# Patient Record
Sex: Female | Born: 1975 | Race: Black or African American | Hispanic: No | Marital: Single | Smoking: Former smoker
Health system: Southern US, Community
[De-identification: ages and names within clinical notes are randomized; demographics above are authoritative.]

## PROBLEM LIST (undated history)

## (undated) DIAGNOSIS — M5126 Other intervertebral disc displacement, lumbar region: Secondary | ICD-10-CM

## (undated) DIAGNOSIS — G35 Multiple sclerosis: Secondary | ICD-10-CM

## (undated) DIAGNOSIS — M5136 Other intervertebral disc degeneration, lumbar region: Secondary | ICD-10-CM

## (undated) DIAGNOSIS — E079 Disorder of thyroid, unspecified: Secondary | ICD-10-CM

## (undated) DIAGNOSIS — J45909 Unspecified asthma, uncomplicated: Secondary | ICD-10-CM

## (undated) DIAGNOSIS — M6520 Calcific tendinitis, unspecified site: Secondary | ICD-10-CM

## (undated) DIAGNOSIS — J189 Pneumonia, unspecified organism: Secondary | ICD-10-CM

## (undated) DIAGNOSIS — M1990 Unspecified osteoarthritis, unspecified site: Secondary | ICD-10-CM

## (undated) HISTORY — PX: CHOLECYSTECTOMY: SHX55

## (undated) HISTORY — DX: Other intervertebral disc displacement, lumbar region: M51.26

---

## 2002-08-17 ENCOUNTER — Encounter: Payer: Self-pay | Admitting: Emergency Medicine

## 2002-08-17 ENCOUNTER — Encounter (INDEPENDENT_AMBULATORY_CARE_PROVIDER_SITE_OTHER): Payer: Self-pay | Admitting: *Deleted

## 2002-08-17 ENCOUNTER — Observation Stay (HOSPITAL_COMMUNITY): Admission: EM | Admit: 2002-08-17 | Discharge: 2002-08-18 | Payer: Self-pay | Admitting: Emergency Medicine

## 2002-08-17 ENCOUNTER — Encounter: Payer: Self-pay | Admitting: General Surgery

## 2004-09-08 ENCOUNTER — Emergency Department (HOSPITAL_COMMUNITY): Admission: EM | Admit: 2004-09-08 | Discharge: 2004-09-08 | Payer: Self-pay | Admitting: Family Medicine

## 2004-12-20 ENCOUNTER — Ambulatory Visit: Payer: Self-pay | Admitting: Internal Medicine

## 2005-01-10 ENCOUNTER — Ambulatory Visit: Payer: Self-pay | Admitting: Internal Medicine

## 2005-06-27 ENCOUNTER — Other Ambulatory Visit: Admission: RE | Admit: 2005-06-27 | Discharge: 2005-06-27 | Payer: Self-pay | Admitting: Gynecology

## 2005-08-26 ENCOUNTER — Emergency Department (HOSPITAL_COMMUNITY): Admission: EM | Admit: 2005-08-26 | Discharge: 2005-08-26 | Payer: Self-pay | Admitting: Emergency Medicine

## 2006-01-15 ENCOUNTER — Ambulatory Visit: Payer: Self-pay | Admitting: Obstetrics & Gynecology

## 2006-01-18 ENCOUNTER — Ambulatory Visit: Payer: Self-pay | Admitting: Family Medicine

## 2006-01-19 ENCOUNTER — Encounter (INDEPENDENT_AMBULATORY_CARE_PROVIDER_SITE_OTHER): Payer: Self-pay | Admitting: *Deleted

## 2006-01-19 ENCOUNTER — Inpatient Hospital Stay (HOSPITAL_COMMUNITY): Admission: AD | Admit: 2006-01-19 | Discharge: 2006-01-23 | Payer: Self-pay | Admitting: Gynecology

## 2006-04-11 ENCOUNTER — Other Ambulatory Visit: Admission: RE | Admit: 2006-04-11 | Discharge: 2006-04-11 | Payer: Self-pay | Admitting: Gynecology

## 2006-08-16 ENCOUNTER — Ambulatory Visit (HOSPITAL_BASED_OUTPATIENT_CLINIC_OR_DEPARTMENT_OTHER): Admission: RE | Admit: 2006-08-16 | Discharge: 2006-08-16 | Payer: Self-pay | Admitting: Gynecology

## 2007-08-20 ENCOUNTER — Emergency Department (HOSPITAL_COMMUNITY): Admission: EM | Admit: 2007-08-20 | Discharge: 2007-08-20 | Payer: Self-pay | Admitting: Emergency Medicine
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
## 2009-03-27 DIAGNOSIS — A419 Sepsis, unspecified organism: Secondary | ICD-10-CM

## 2009-03-27 HISTORY — DX: Sepsis, unspecified organism: A41.9

## 2010-08-12 NOTE — H&P (Signed)
NAME:  [name], [name]                     ACCOUNT NO.:  [id number]   MEDICAL RECORD NO.:  [id number]                   PATIENT TYPE:  INP   LOCATION:  1826                                 FACILITY:  [facility]   PHYSICIAN:  Sharlet Salina T. Hoxworth, M.D.          DATE OF BIRTH:  [birth year]   DATE OF ADMISSION:  08/17/2002  DATE OF DISCHARGE:                                HISTORY & PHYSICAL   CHIEF COMPLAINT:  Abdominal pain.   HISTORY OF PRESENT ILLNESS:  The patient is a 35 year old black female who  presented to the [facility] Emergency Room with a 12-hour history of  persistent, severe, pressure-like pain in the epigastrium, radiating up into  the chest to the right upper quadrant and to her back.  This has been  associated with nausea and vomiting.  She has had episodes of less severe  similar pain over the last few weeks.  No change in bowel habits.  No fever,  chills, or jaundice.   PAST MEDICAL HISTORY:  She has had a C section, otherwise entirely negative.   MEDICATIONS:  None.   ALLERGIES:  PENICILLIN.   SOCIAL HISTORY:  She is separated.  Does not smoke cigarettes or drink  alcohol.   FAMILY HISTORY:  Noncontributory.   REVIEW OF SYSTEMS:  GENERAL:  No fever, chills, weight change.  RESPIRATORY:  No shortness of breath, cough, history of asthma.  CARDIAC:  No  palpitations, history of heart disease, or chest pain other than as above.  ABDOMEN:  As above.  GU: No burning, frequency, abnormal bleeding, or  discharge.   PHYSICAL EXAMINATION:  GENERAL:  Mildly obese black female in no acute  distress.  SKIN:  Warm and dry without rash or infection.  NECK:  No palpable mass or thyromegaly.  HEENT:  Sclerae nonicteric.  Nares and oropharynx clear.  LUNGS:  Clear to auscultation.  CARDIAC:  Regular rate and rhythm without murmurs.  No JVD or edema.  ABDOMEN:  Tender in the epigastrium and right upper quadrant.  No palpable  masses or hepatosplenomegaly.  EXTREMITIES:   No edema or deformity.  NEUROLOGIC:  Alert and fully oriented.   LABORATORY DATA:  Lipase, CBC, electrolytes are all within normal limits.  SGOT and SGPT are elevated at 265 and 94, respectively.  Alkaline  phosphatase and bilirubin are normal.   Ultrasound of the gallbladder has shown a large stone impacting the  gallbladder neck.  Common bile duct and hepatic ducts appear normal.    ASSESSMENT AND PLAN:  Apparent cholelithiasis with acute cholecystitis.  I  have recommended proceeding with laparoscopic cholecystectomy with  cholangiogram.  She is brought into the hospital for this procedure.  Lorne Skeens. Hoxworth, M.D.    Tory Emerald  D:  08/17/2002  T:  08/17/2002  Job:  [id number]

## 2010-08-12 NOTE — H&P (Signed)
[name], [name]               ACCOUNT NO.:  [id number]   MEDICAL RECORD NO.:  [id number]          PATIENT TYPE:  INP   LOCATION:  9109                          FACILITY:  [facility]   PHYSICIAN:  Juan H. [name], M.D.DATE OF BIRTH:  [birth year]   DATE OF ADMISSION:  01/19/2006  DATE OF DISCHARGE:                                HISTORY & PHYSICAL   CHIEF COMPLAINT:  1. Twin gestation 63 1/2 weeks estimated gestational age.  2. Contractions and pelvic pain.  3. Decreased AFI in twin B.  4. Disproportionate growth between A and B.   HISTORY:  The patient is a 35 year old gravida 2, para 1, last menstrual  period May 16, 2005, estimated date of confinement February 20, 2006,  currently 35 1/[redacted] weeks gestation.  The patient presented to the office today  complaining of increasing low pelvic pressure.  The patient could barely  walk when she presented to the office today.  Her pelvic exam demonstrated  her cervix was fingertip, 80-90% effaced, ballotable presentation.  She had  been placed on the monitor and was found to have irregular contractions and  could not find a comfortable position because of the constant pelvic pain.  No vaginal bleeding was reported.  The patient has been followed prenatally  due to her twin gestation with serial ultrasounds and NSTs.  She does have  amniotic/dichorionic twins.  The last ultrasound done in the office on  October 25 demonstrated that twin B was transverse lie, approximately the  50th percentile for weight, gestational age and AFI was marginal at 4.1.  Twin A was large for gestational age measuring 90th percentile with an AFI  of 5.9.  These twins were spontaneous, they were not ovulation induction.  Patient with a prior cesarean section had requested elective repeat which  was originally scheduled for November 8.  The remainder of her prenatal  course also significant for the fact that she declined first trimester  screening and cystic  fibrosis screening.   ALLERGIES:  The patient is allergic to PENICILLIN.   PAST MEDICAL HISTORY:  She has had one cesarean section in December 1998  secondary to failure to progress at [redacted] weeks gestation.  She denies any  other medical problems.  For additional information, see form.   REVIEW OF SYSTEMS:  See form.   PHYSICAL EXAMINATION:  VITAL SIGNS:  The patient's blood pressure initially was 140/92, repeat  132/88 and 130/88, no protein or sugar in her urine.  HEENT:  Unremarkable.  NECK:  Supple, trachea midline, no carotid bruits, no thyromegaly.  LUNGS:  Clear to auscultation without rhonchi or wheezes.  HEART:  Regular rate and rhythm.  No murmurs or gallops.  BREASTS:  Exam not done.  ABDOMEN:  Gravid uterus.  Fundal height 40 cm.  Fetal heart tones x2.  PELVIC:  Cervix fingertip, 90% effaced, tender on pelvic exam, no vaginal  discharge noted.  EXTREMITIES:  DTRs 1+, negative clonus, trace edema.   ASSESSMENT:  35 year old gravida 2, para 1, at 37 1/2 weeks estimated  gestational age with twin gestation (amniotic dichorionic) with premature  contractions and pelvic pain, significant cervical effacement.  Recent  ultrasound difference in growth, twin A in the 90th percentile, twin B in  the 50th percentile.  Also, decreased AFI on twin B with an AFI of 4.1 and  AFI of twin A was 5.9.  The patient was counseled that, at this point, to  prevent the risks of her rupturing her uterus and since she is having a lot  of lower abdominal pressure and pain, that I will proceed with a repeat  emergency cesarean section now and she is requesting permanent  sterilization, a segment of each tube will be transected and sent off for  pathology.  She is fully aware that this procedure is permanent, she will  not be able to have anymore children.  The risks of infection, bleeding,  trauma to internal organs were discussed with the patient.  Her husband was  in attendance.  All questions  were answered.   PLAN:  Repeat lower uterine segment transverse cesarean section with  bilateral tubal sterilization procedure immediately.      Juan H. [name], M.D.  Electronically Signed     JHF/MEDQ  D:  01/19/2006  T:  01/19/2006  Job:  [id number]

## 2010-08-12 NOTE — Discharge Summary (Signed)
[name], [name]               ACCOUNT NO.:  [id number]   MEDICAL RECORD NO.:  [id number]          PATIENT TYPE:  INP   LOCATION:  9109                          FACILITY:  [facility]   PHYSICIAN:  Timothy P. Fontaine, M.D.DATE OF BIRTH:  February 19, 1976   DATE OF ADMISSION:  01/19/2006  DATE OF DISCHARGE:  01/23/2006                                 DISCHARGE SUMMARY   DISCHARGE DIAGNOSES:  1. Twin pregnancy at 35 weeks, delivered.  2. Labor.  3. History of prior cesarean section for repeat cesarean section.  4. Desires permanent sterilization.   PROCEDURE:  Repeat low transverse cervical cesarean section with bilateral  tubal sterilization per Dr. Gaetano Hawthorne. [name] on 01/19/2006, pathology  pending.   HOSPITAL COURSE:  A 35 year old G2, P33 female at [redacted] weeks gestation with  twins who presented in labor.  The patient has a history of prior cesarean  section and was planned for a repeat cesarean section.  She subsequently  underwent a repeat low transverse cervical cesarean section, tubal  sterilization without difficulty; producing twin A 5 pounds 12 ounces,  Apgars 9 and 9, female; twin B 4 pounds 12 ounces, Apgars 8 and 9, female.   The patient's postpartum course was uncomplicated.  She was discharged on  postpartum day #4 ambulating well, tolerating a regular diet with  postoperative hemoglobin of 10.8.  Her rubella titer is positive.  Her blood  type is B+.  She received precautions, instructions, and follow up.  She  will be seen in the office 6 weeks following discharge.  She received a  prescription for pain medication per Dr. Reynaldo Minium.      Timothy P. Fontaine, M.D.  Electronically Signed     TPF/MEDQ  D:  01/23/2006  T:  01/23/2006  Job:  [id number]

## 2010-08-12 NOTE — Op Note (Signed)
[name], [name]               ACCOUNT NO.:  [id number]   MEDICAL RECORD NO.:  [id number]          PATIENT TYPE:  INP   LOCATION:                                FACILITY[facility]   PHYSICIAN:  Juan H. [name], M.D.DATE OF BIRTH:  December 29, 1975   DATE OF PROCEDURE:  01/19/2006  DATE OF DISCHARGE:                                 OPERATIVE REPORT   INDICATIONS FOR OPERATION:  35 year old gravida 2, para 1 with twin  gestation at 35-1/2 weeks estimated gestational age, in labor with previous  cesarean section, currently 35-1/[redacted] weeks gestation, also requesting elective  permanent sterilization.   PREOPERATIVE DIAGNOSES:  1. Twin gestation.  2. Previous cesarean section.  3. Labor.  4. Requests elective permanent sterilization.   POSTOPERATIVE DIAGNOSIS:  1. Twin gestation.  2. Previous cesarean section.  3. Labor.  4. Requests elective permanent sterilization.   ANESTHESIA:  Spinal.   SURGEON:  Juan H. [name], M.D. and Naima A. Normand Sloop, M.D.   PROCEDURE PERFORMED:  1. Repeat lower uterine segment transverse cesarean section.  2. Bilateral tubal sterilization procedure, Pomeroy technique.   FINDINGS:  Twin A vertex female delivered 1850 hours, Apgars of 9 and 9,  arterial cord pH 7.29, weight of 5 pounds 12 ounces; Twin B boy in breech  presentation, delivered 1851 hours, Apgars of 8 and 9 with arterial cord pH  of 7.22 and weight of 4 pounds 12 ounces.   DESCRIPTION OF OPERATION:  After the patient was adequately counseled, she  was taken to the operating room.  Prior to that she had been monitored in  maternity admission was found to have reassuring tracing of both twins but  was found to be contracting every two to three minutes apart.  Upon arrival  to the operating room, she underwent successful spinal anesthesia placement.  The abdomen was prepped and draped in usual sterile fashion after Foley  catheter had been inserted in an effort to monitor urinary output.   The  patient had a previous keloid from her previous cesarean section and it was  excised first and then the incision was carried out through the subcutaneous  tissue down to the rectus fascia whereby midline nick was made.  The fascia  was incised in transverse fashion.  Midline raphe was entered cautiously.  After the peritoneal cavity was entered, meticulously dissection was  required to free some adhesions near the bladder from her previous cesarean  section in an effort to develop the bladder flap.  Once this was  accomplished, the lower uterine segment was incised in transverse fashion.  Clear amniotic fluid was present.  Twin A was delivered in the vertex  presentation.  The nasopharyngeal area was bulb suctioned.  The cord was  clamped and the newborn was properly identified, passed off to the  neonatologist who gave the above-mentioned parameters.  Twin B with separate  sac, sac was ruptured, was clear, was delivered in breech presentation and  the nasopharyngeal area was bulb suctioned.  Cord was doubly clamped and  excised, appropriately labeled and the newborn was passed off to the  neonatologists, who were in attendance, who gave the above-mentioned  parameters.  The two placentas had two cords and two sacs, were passed off  the operative field for histological evaluation.  Pitocin drip was started.  Due to the fact the patient is allergic to penicillin, she received  clindamycin 900 mg IV.  The uterus was swept clear remaining products of  conception.  It was not exteriorized.  The transverse incision was closed  with a single layer of 0 Vicryl suture in locking stitch manner.  Since the  patient requested bilateral tubal sterilization procedure, the left  fallopian tube was brought into view and a 2 cm segment was suture ligated,  with 3-0 Vicryl suture and segment was excised and passed off the operative  field for histological evaluation.  The remaining stumps were Bovie   cauterized.  Similar procedure was carried out on the contralateral side.  A  suture had slipped and the mesosalpinx was grasped with a hemostat and a  running stitch of 3-0 Vicryl suture secured the mesosalpinx and remaining  fallopian tube stumps.  The 2 cm segment from the right side was submitted  for histological evaluation along with a segment of the left fallopian tube.  After ascertaining adequate hemostasis.  The pelvic cavity was copiously  irrigated with normal saline solution.  The visceral peritoneum was not  reapproximated but the rectus fascia was closed with running stitch of 0  Vicryl suture.  The subcutaneous bleeders were Bovie cauterized.  Skin was  reapproximated with skin clips followed by placing Xeroform gauze and 4 x 8  dressing.  Of note, the patient received 0.25% Marcaine infiltrated at the  planned Pfannenstiel incision site for a total of 10 mL.  The patient was  transferred to recovery in stable vital signs.  Blood loss from procedure  was 700 mL.  Urine output 100 mL and IV fluids consisted of 3100 mL of  lactated Ringer's.      Juan H. [name], M.D.  Electronically Signed     JHF/MEDQ  D:  01/19/2006  T:  01/21/2006  Job:  [id number]

## 2010-08-12 NOTE — Op Note (Signed)
NAME:  [name], [name]                     ACCOUNT NO.:  [id number]   MEDICAL RECORD NO.:  [id number]                   PATIENT TYPE:  INP   LOCATION:  1826                                 FACILITY:  [facility]   PHYSICIAN:  Sharlet Salina T. Hoxworth, M.D.          DATE OF BIRTH:  10/29/1975   DATE OF PROCEDURE:  08/17/2002  DATE OF DISCHARGE:                                 OPERATIVE REPORT   PREOPERATIVE DIAGNOSIS:  Cholelithiasis and cholecystitis.   POSTOPERATIVE DIAGNOSIS:  Cholelithiasis and cholecystitis.   OPERATION PERFORMED:  Laparoscopic cholecystectomy with intraoperative  cholangiogram.   SURGEON:  Sharlet Salina T. Hoxworth, M.D.   ASSISTANT:  Adolph Pollack, M.D.   ANESTHESIA:  General.   INDICATIONS FOR PROCEDURE:  [name] is a 35 year old black female  who presents with 12 hours of persistent severe epigastric and right upper  quadrant abdominal pain.  Work-up includes a gallbladder ultrasound showing  a large stone impacted in the neck of the gallbladder.  She had mildly  elevated transaminases and the rest of her liver tests are normal.  Laparoscopic cholecystectomy with intraoperative cholangiogram has been  recommended and accepted.  The nature of the procedure, its indications and  risks of bleeding, infection, bile leak and bile duct injury were discussed  and understood preoperatively.  She is now brought to the operating room for  the procedure.   DESCRIPTION OF PROCEDURE:  The patient was brought to the operating room and  placed in supine position on the operating table and general endotracheal  anesthesia was induced.  She received preoperative antibiotics.  The abdomen  was sterilely prepped and draped.  PAS were in place.  Local anesthesia was  used to infiltrate the trocar sites prior to the incisions.  A 1 cm incision  was made at the umbilicus and under direct vision, access was obtained with  a Hasson trocar through a mattress suture of 0  Vicryl.  Under direct vision,  a 10 mm trocar was placed in the subxiphoid area and two 5 mm trocars along  the right subcostal margin.  There were some chronic adhesions of omentum to  the edge of the liver that were taken down sharply.  The gallbladder was  exposed and was edematous.  There was a large stone in the neck of the  gallbladder.  The fundus was grasped and elevated up over the liver and the  infundibulum retracted inferolaterally.  Fibrofatty tissue was stripped down  off the neck of the gallbladder toward the porta hepatis.  The peritoneum  was divided anteriorly and posteriorly along Calot's triangle and the distal  gallbladder and Calot's triangle thoroughly dissected.  The cystic duct and  cystic artery were seen coursing parallel.  The cystic artery was dissected  free and controlled with two proximal and distal clips.  The cystic duct was  then clipped at the gallbladder junction and operative cholangiogram  obtained through the cystic duct.  This showed  good filling of a normal  common bile duct, common hepatic duct and intrahepatic ducts with free flow  into the duodenum and no filling defects.  Following this, the cholangiocath  was removed and the cystic duct was doubly clipped proximally and divided.  The gallbladder was dissected free from its bed using hook cautery.  A  posterior branch of the cystic artery was controlled with clips.  The  gallbladder was detached and removed intact through the umbilicus.  The right upper quadrant was thoroughly irrigated and hemostasis ensured.  The trocars were removed under direct vision and all CO2 evacuated from the  peritoneal cavity.  The mattress suture was secured at the umbilicus.  Skin  incisions were closed with interrupted subcuticular 4-0 Monocryl and Steri-  Strips.                                                Lorne Skeens. Hoxworth, M.D.    Tory Emerald  D:  08/17/2002  T:  08/18/2002  Job:  [id number]

## 2010-08-12 NOTE — Op Note (Signed)
Guerrero, Stefanie               ACCOUNT NO.:  [id number]   MEDICAL RECORD NO.:  [id number]          PATIENT TYPE:  AMB   LOCATION:  [facility]                         [facility]ACILITY:  [facility]   PHYSICIAN:  Juan H. [name], M.D.DATE OF BIRTH:  08-Dec-1975   DATE OF PROCEDURE:  08/16/2006  DATE OF DISCHARGE:                               OPERATIVE REPORT   INDICATION FOR OPERATION:  A 35 year old gravida 4 para 3 AB 2 (twins),  who is having history of menorrhagia and was taken to the operating room  for endometrial ablation.  Preoperative endometrial biopsy was negative.   PREOPERATIVE DIAGNOSIS:  Menorrhagia.   POSTOPERATIVE DIAGNOSIS:  Menorrhagia.   ANESTHESIA:  General endotracheal anesthesia.   PROCEDURE PERFORMED:  1. Attempt NovaSure endometrial ablation.  2. VaporTrode endometrial ablation.   FINDINGS:  The patient had a long cervix and a uterine cavity which was  11 cm which did not allow for the NovaSure procedure to be undertaken,  so a VaporTrode endometrial ablation technique was utilized instead.  Normal tubal ostia was identified and no intracavitary lesions.   DESCRIPTION OF OPERATION:  After the patient was adequately counseled,  she was taken to the operating room where he underwent a successful  general endotracheal anesthesia.  She had received 1 g of cefazolin IV  in the operating room.  She was placed in low lithotomy position.  The  vagina and perineum were prepped and draped in the usual sterile  fashion.  A Foley catheter had been inserted in an effort to evacuate  the bladder of its contents.  The anterior cervical lip was grasped with  a single-tooth tenaculum.  The endocervical canal measurement was  approximately 3 cm, and the uterine cavity was about 11, so it was given  a uterine cavity measurement for about 7.5 cm.  The NovaSure would only  allow maximum length for a cavity 6.5 cm, so NovaSure endometrial  ablation was not able to be performed.  Of note  also, she had diagnostic  hysteroscopy for complete evaluation of the entire cavity.  So then the  instruments were changed whereby the VaporTrode was attached to the  working element, and 3% sorbitol was used as a distending media.  The  endometrium was then ablated in a circumferential fashion from the  fundus to the anterior cervical canal in a circumferential fashion for a  depth about 3-4 mm.  Once this is completed, the instruments were  removed, and it was noted that from the tenaculum there was a slight  tear from the anterior  cervical lip that was bleeding which was secured with a running locking  stitch of 0 Vicryl suture.  The patient was awakened, transferred to the  recovery room with stable vital signs.  Blood loss is minimal.  Fluid  resuscitation consisted of 900 mL of lactated Ringer's.  The patient  received 30 mg of Toradol en route to the recovery room.      Juan H. [name], M.D.  Electronically Signed     JHF/MEDQ  D:  08/16/2006  T:  08/16/2006  Job:  [id number]

## 2010-08-12 NOTE — H&P (Signed)
[name], [place] NO.:  [id number]   MEDICAL RECORD NO.:  [id number]          PATIENT TYPE:   LOCATION:                                 FACILITY:   PHYSICIAN:  Juan H. [name], M.D.DATE OF BIRTH:  1975-09-09   DATE OF ADMISSION:  DATE OF DISCHARGE:                              HISTORY & PHYSICAL   The patient is scheduled for surgery Thursday, May 22 at 7:30 a.m. at  [facility] __________  Surgical [facility].   CHIEF COMPLAINT:  Menorrhagia.   HISTORY:  The patient is a 35 year old gravida 4, para 3, ab 2 (twins)  who as early as January of this year when she was seen for a post partum  visit, she had been complaining of menorrhagia from that time.  She has  had tubal sterilization at the time of her C-section, and we were  monitoring her cycles until recently when she presented back to the  office stating that her cycles were very heavy with cramping.  I had  previously given her literature and information on endometrial ablation,  and she presented to the office on May 19 for her preoperative  consultation and underwent an endometrial biopsy.  Her endometrial  biopsy demonstrated proliferative endometrium with no hyperplasia and  risks and benefits and pro's have been discussed.  Her previous Cesarean  section had been a transverse lower uterine segment incision.   PAST MEDICAL HISTORY:  The patient is allergic to PENICILLIN.   She has had a previous lower uterine segment transverse Cesarean section  with bilateral tubal sterilization secondary to twins in 2007.  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 and she also had a  cholecystectomy in 2003.   PHYSICAL EXAMINATION:  GENERAL:  The patient weighs 197 pounds, height 5  feet, 7 inches tall.  VITAL SIGNS:  Blood pressure 120/84.  HEENT:  Unremarkable.  NECK:  Supple.  Trachea midline.  No carotid bruits.  No thyromegaly.  LUNGS:  Clear to auscultation without any rhonchi or  wheezes.  HEART:  Regular rate and rhythm without any murmur or gallop.  BREAST EXAM:  Done at the time of her annual exam in January.  ABDOMEN:  Soft, nontender.  No rebound or guarding.  PELVIC:  Bartholin's, urethral, and Skene's glands within normal limits.  Vagina and cervix no lesions or discharge.  Uterus anteverted.  Normal  size, shape, and consistency without any masses or tenderness.  RECTAL:  Deferred.   ASSESSMENT:  A 35 year old gravida 4, para 3, ab 2 with dysmenorrhea and  menorrhagia.  Had previously been given information on the NovaSure  endometrial ablation procedure.  Recent Pap smear January of this year  was normal.  Patient's endometrial biopsy on May 19 demonstrated  proliferative endometrium with no hyperplasia or malignancy identified.  Patient has had a previous tubal sterilization procedure in January of  this year.   The risks and benefits and pro's and con's of the operation were  discussed with the patient to include infection, bleeding, perforation  of the uterus, and trauma to nearby  organs were discussed.  Patient is  fully aware and accepts all the above risks.  All questions were  answered and will follow accordingly.   PLAN:  Patient scheduled for endometrial ablation, NovaSure technique on  Thursday, May 22 at [facility] __________  S[facility] at 7:30 a.m.      Juan H. [name], M.D.  Electronically Signed     JHF/MEDQ  D:  08/15/2006  T:  08/15/2006  Job:  [id number]

## 2012-09-18 ENCOUNTER — Other Ambulatory Visit (HOSPITAL_BASED_OUTPATIENT_CLINIC_OR_DEPARTMENT_OTHER): Payer: Self-pay | Admitting: Neurology

## 2012-09-18 DIAGNOSIS — G35 Multiple sclerosis: Secondary | ICD-10-CM

## 2012-09-21 ENCOUNTER — Ambulatory Visit (HOSPITAL_BASED_OUTPATIENT_CLINIC_OR_DEPARTMENT_OTHER): Payer: Managed Care, Other (non HMO)

## 2013-03-24 ENCOUNTER — Encounter (HOSPITAL_COMMUNITY): Payer: Self-pay | Admitting: Emergency Medicine

## 2013-03-24 DIAGNOSIS — Z87891 Personal history of nicotine dependence: Secondary | ICD-10-CM | POA: Insufficient documentation

## 2013-03-24 DIAGNOSIS — Z880 Allergy status to penicillin: Secondary | ICD-10-CM | POA: Insufficient documentation

## 2013-03-24 DIAGNOSIS — R197 Diarrhea, unspecified: Secondary | ICD-10-CM | POA: Insufficient documentation

## 2013-03-24 DIAGNOSIS — Z3202 Encounter for pregnancy test, result negative: Secondary | ICD-10-CM | POA: Insufficient documentation

## 2013-03-24 DIAGNOSIS — K429 Umbilical hernia without obstruction or gangrene: Secondary | ICD-10-CM | POA: Insufficient documentation

## 2013-03-24 DIAGNOSIS — Z79899 Other long term (current) drug therapy: Secondary | ICD-10-CM | POA: Insufficient documentation

## 2013-03-24 DIAGNOSIS — J111 Influenza due to unidentified influenza virus with other respiratory manifestations: Secondary | ICD-10-CM | POA: Insufficient documentation

## 2013-03-24 DIAGNOSIS — G35 Multiple sclerosis: Secondary | ICD-10-CM | POA: Insufficient documentation

## 2013-03-24 LAB — BASIC METABOLIC PANEL
CO2: 26 mEq/L (ref 19–32)
Calcium: 8.7 mg/dL (ref 8.4–10.5)
Chloride: 102 mEq/L (ref 96–112)
Glucose, Bld: 118 mg/dL — ABNORMAL HIGH (ref 70–99)
Sodium: 137 mEq/L (ref 135–145)

## 2013-03-24 LAB — URINALYSIS, ROUTINE W REFLEX MICROSCOPIC
Ketones, ur: NEGATIVE mg/dL
Leukocytes, UA: NEGATIVE
Protein, ur: NEGATIVE mg/dL
Urobilinogen, UA: 0.2 mg/dL (ref 0.0–1.0)

## 2013-03-24 LAB — POCT PREGNANCY, URINE: Preg Test, Ur: NEGATIVE

## 2013-03-24 LAB — CBC WITH DIFFERENTIAL/PLATELET
Eosinophils Relative: 2 % (ref 0–5)
HCT: 31.6 % — ABNORMAL LOW (ref 36.0–46.0)
Lymphocytes Relative: 29 % (ref 12–46)
Lymphs Abs: 1.9 10*3/uL (ref 0.7–4.0)
MCV: 85.6 fL (ref 78.0–100.0)
Neutro Abs: 3.9 10*3/uL (ref 1.7–7.7)
Platelets: 297 10*3/uL (ref 150–400)
RBC: 3.69 MIL/uL — ABNORMAL LOW (ref 3.87–5.11)
WBC: 6.4 10*3/uL (ref 4.0–10.5)

## 2013-03-24 NOTE — ED Notes (Signed)
Pt states that she began having severe midline lower abdominal pain on Saturday.  Pt states she feels a "knot or tightening" as well.  Pt states her abdomen is tender to the touch.

## 2013-03-25 ENCOUNTER — Emergency Department (HOSPITAL_COMMUNITY)
Admission: EM | Admit: 2013-03-25 | Discharge: 2013-03-25 | Disposition: A | Discharge status: Home / Self Care | Payer: Managed Care, Other (non HMO) | Attending: Emergency Medicine | Admitting: Emergency Medicine

## 2013-03-25 ENCOUNTER — Emergency Department (HOSPITAL_COMMUNITY): Payer: Managed Care, Other (non HMO)

## 2013-03-25 DIAGNOSIS — K429 Umbilical hernia without obstruction or gangrene: Secondary | ICD-10-CM

## 2013-03-25 HISTORY — DX: Multiple sclerosis: G35

## 2013-03-25 MED ORDER — IOHEXOL 300 MG/ML  SOLN
100.0000 mL | Freq: Once | INTRAMUSCULAR | Status: AC | PRN
  Administered 2013-03-25: 100 mL via INTRAVENOUS

## 2013-03-25 MED ORDER — IOHEXOL 300 MG/ML  SOLN: 25.0000 mL | Freq: Once | INTRAMUSCULAR | Status: DC | PRN

## 2013-03-25 MED ORDER — OXYCODONE-ACETAMINOPHEN 5-325 MG PO TABS: 2.0000 | ORAL_TABLET | ORAL | Status: DC | PRN

## 2013-03-25 MED ORDER — MORPHINE SULFATE 4 MG/ML IJ SOLN
4.0000 mg | Freq: Once | INTRAMUSCULAR | Status: AC
  Administered 2013-03-25: 4 mg via INTRAVENOUS
  Filled 2013-03-25: qty 1

## 2013-03-25 MED ORDER — PROMETHAZINE HCL 25 MG PO TABS: 25.0000 mg | ORAL_TABLET | Freq: Four times a day (QID) | ORAL | Status: AC | PRN

## 2013-03-25 NOTE — ED Provider Notes (Signed)
CSN: [id number]     Arrival date & time 03/24/13  1839 History   First MD Initiated Contact with Patient 03/25/13 0013     Chief Complaint  Patient presents with  . Abdominal Pain   (Consider location/radiation/quality/duration/timing/severity/associated sxs/prior Treatment) HPI Comments: Pt dx with flu - clinically treated since Friday - Saturday - notcied periumbilical pain - gradually worsening and with a hard palpable area.  Pain with movement, sitting and burning / stabbing pain - Has had some recent diarrha or loose stools, then hard stools.  No dysuria but has some increased urge.  She has MS and has this occasionally.  Sx are persistent and gradually worsening.    Flu:  No fevers, but has had myalgias and chills.  Daugther with fever and flu sx.  She was tested and neg for strep.  No rashes.    Patient is a 37 y.o. female presenting with abdominal pain. The history is provided by the patient.  Abdominal Pain   Past Medical History  Diagnosis Date  . Multiple sclerosis    Past Surgical History  Procedure Laterality Date  . Cholecystectomy    . Cesarean section     No family history on file. History  Substance Use Topics  . Smoking status: Former Games developer  . Smokeless tobacco: Former Neurosurgeon    Quit date: 03/25/2011  . Alcohol Use: No   OB History   Grav Para Term Preterm Abortions TAB SAB Ect Mult Living                 Review of Systems  Gastrointestinal: Positive for abdominal pain.  All other systems reviewed and are negative.    Allergies  Penicillins  Home Medications   Current Outpatient Rx  Name  Route  Sig  Dispense  Refill  . ALPRAZolam (XANAX) 0.5 MG tablet   Oral   Take 0.5 mg by mouth 2 (two) times daily as needed for anxiety.         . diazepam (VALIUM) 2 MG tablet   Oral   Take 2 mg by mouth every 6 (six) hours as needed for anxiety.         Marland Kitchen ibuprofen (ADVIL,MOTRIN) 200 MG tablet   Oral   Take 200 mg by mouth every 6 (six) hours as  needed for fever.         Marland Kitchen oseltamivir (TAMIFLU) 75 MG capsule   Oral   Take 75 mg by mouth 2 (two) times daily. For 5 days. Started on 03-22-13 on day 3 of therapy         . oxyCODONE-acetaminophen (PERCOCET/ROXICET) 5-325 MG per tablet   Oral   Take 2 tablets by mouth every 4 (four) hours as needed for severe pain.   6 tablet   0   . promethazine (PHENERGAN) 25 MG tablet   Oral   Take 1 tablet (25 mg total) by mouth every 6 (six) hours as needed for nausea or vomiting.   12 tablet   0    BP 104/70  Pulse 80  Temp(Src) 98.2 F (36.8 C) (Oral)  Resp 18  Wt 196 lb 1.6 oz (88.95 kg)  SpO2 100%  LMP 03/04/2013 Physical Exam  Nursing note and vitals reviewed. Constitutional: She appears well-developed and well-nourished. No distress.  HENT:  Head: Normocephalic and atraumatic.  Mouth/Throat: Oropharynx is clear and moist. No oropharyngeal exudate.  Eyes: Conjunctivae and EOM are normal. Pupils are equal, round, and reactive to light. Right eye  exhibits no discharge. Left eye exhibits no discharge. No scleral icterus.  Neck: Normal range of motion. Neck supple. No JVD present. No thyromegaly present.  Cardiovascular: Normal rate, regular rhythm, normal heart sounds and intact distal pulses.  Exam reveals no gallop and no friction rub.   No murmur heard. Pulmonary/Chest: Effort normal and breath sounds normal. No respiratory distress. She has no wheezes. She has no rales.  Abdominal: Soft. Bowel sounds are normal. She exhibits no distension and no mass. There is tenderness ( focal RLQ ttp with mild guarding - has periumbilical ttp and mass palpated under the scar in the umbilicus midline (chole scar).).  Musculoskeletal: Normal range of motion. She exhibits no edema and no tenderness.  Lymphadenopathy:    She has no cervical adenopathy.  Neurological: She is alert. Coordination normal.  Skin: Skin is warm and dry. No rash noted. No erythema.  Psychiatric: She has a normal  mood and affect. Her behavior is normal.    ED Course  Procedures (including critical care time) Labs Review Labs Reviewed  CBC WITH DIFFERENTIAL - Abnormal; Notable for the following:    RBC 3.69 (*)    Hemoglobin 10.4 (*)    HCT 31.6 (*)    All other components within normal limits  BASIC METABOLIC PANEL - Abnormal; Notable for the following:    Glucose, Bld 118 (*)    All other components within normal limits  URINALYSIS, ROUTINE W REFLEX MICROSCOPIC  POCT PREGNANCY, URINE   Imaging Review Ct Abdomen Pelvis W Contrast  03/25/2013   CLINICAL DATA:  Abdominal tightening since Saturday.  History of MS.  EXAM: CT ABDOMEN AND PELVIS WITH CONTRAST  TECHNIQUE: Multidetector CT imaging of the abdomen and pelvis was performed using the standard protocol following bolus administration of intravenous contrast.  CONTRAST:  OMNIPAQUE IOHEXOL 300 MG/ML  SOLN  COMPARISON:  None.  FINDINGS: Lung bases are clear.  Surgical absence of the gallbladder. The liver, spleen, pancreas, adrenal glands, kidneys, abdominal aorta, inferior vena cava, and retroperitoneal lymph nodes are unremarkable. The stomach, small bowel, and colon are not abnormally distended. No free air or free fluid in the abdomen. Colon is stool filled. Small umbilical hernia containing fat. Infiltration in the periumbilical fat may represent inflammatory process or fat necrosis.  Pelvis: Uterus is anteverted without enlargement. No abnormal adnexal masses. Appendix is normal. No diverticulitis. No free or loculated pelvic fluid collections. Mild degenerative changes in the lumbar spine.  IMPRESSION: Minimal fat containing umbilical hernia with suggestion of mild soft tissue stranding around the umbilicus. This could represent inflammatory change. No acute process demonstrated in the abdomen or pelvis otherwise.   Electronically Signed   By: Burman Nieves M.D.   On: 03/25/2013 03:43    EKG Interpretation   None       MDM   1.  Umbilical hernia    Pt has a palpable mass in the midline and RLQ - needs w/u with CT to evaluate.  Labs reassuring.  Vital signs are unremarkable, would consider abscess, appendicitis, hernia.  On repeat exam the patient's abdomen remains mildly tender in the periumbilical area. The CT scan was reviewed with the patient and it shows no signs of surgical abdomen, there is a fat-containing umbilical hernia for which he can be referred to the Gen. surgeon as an outpatient. Pain medications given, prescription for home, the patient is stable for discharge.   Meds given in ED:  Medications  iohexol (OMNIPAQUE) 300 MG/ML solution 25  mL (not administered)  morphine 4 MG/ML injection 4 mg (4 mg Intravenous Given 03/25/13 0159)  iohexol (OMNIPAQUE) 300 MG/ML solution 100 mL (100 mLs Intravenous Contrast Given 03/25/13 0252)    New Prescriptions   OXYCODONE-ACETAMINOPHEN (PERCOCET/ROXICET) 5-325 MG PER TABLET    Take 2 tablets by mouth every 4 (four) hours as needed for severe pain.   PROMETHAZINE (PHENERGAN) 25 MG TABLET    Take 1 tablet (25 mg total) by mouth every 6 (six) hours as needed for nausea or vomiting.      Vida Roller, MD 03/25/13 [phone]

## 2013-03-25 NOTE — ED Notes (Signed)
MD at bedside. 

## 2013-03-27 HISTORY — PX: DEBRIDEMENT & IRRIGATION, ABSCESS: SHX3676

## 2013-03-28 ENCOUNTER — Encounter (INDEPENDENT_AMBULATORY_CARE_PROVIDER_SITE_OTHER): Payer: Self-pay | Admitting: Surgery

## 2013-03-28 ENCOUNTER — Ambulatory Visit (HOSPITAL_COMMUNITY)
Admission: RE | Admit: 2013-03-28 | Discharge: 2013-03-28 | Disposition: A | Discharge status: Home / Self Care | Payer: Managed Care, Other (non HMO) | Source: Ambulatory Visit | Attending: Surgery | Admitting: Surgery

## 2013-03-28 ENCOUNTER — Encounter (HOSPITAL_COMMUNITY): Payer: Self-pay | Admitting: Certified Registered Nurse Anesthetist

## 2013-03-28 ENCOUNTER — Ambulatory Visit (HOSPITAL_COMMUNITY): Payer: Managed Care, Other (non HMO) | Admitting: Certified Registered Nurse Anesthetist

## 2013-03-28 ENCOUNTER — Encounter (HOSPITAL_COMMUNITY): Payer: Managed Care, Other (non HMO) | Admitting: Certified Registered Nurse Anesthetist

## 2013-03-28 ENCOUNTER — Encounter (HOSPITAL_COMMUNITY): Admission: RE | Disposition: A | Discharge status: Home / Self Care | Payer: Self-pay | Source: Ambulatory Visit

## 2013-03-28 ENCOUNTER — Ambulatory Visit (INDEPENDENT_AMBULATORY_CARE_PROVIDER_SITE_OTHER): Payer: Managed Care, Other (non HMO) | Admitting: Surgery

## 2013-03-28 VITALS — BP 122/90 | HR 68 | Temp 98.3°F | Resp 14 | Ht 66.0 in | Wt 188.2 lb

## 2013-03-28 DIAGNOSIS — L03319 Cellulitis of trunk, unspecified: Secondary | ICD-10-CM

## 2013-03-28 DIAGNOSIS — L02219 Cutaneous abscess of trunk, unspecified: Secondary | ICD-10-CM | POA: Insufficient documentation

## 2013-03-28 DIAGNOSIS — G35 Multiple sclerosis: Secondary | ICD-10-CM | POA: Insufficient documentation

## 2013-03-28 DIAGNOSIS — L02216 Cutaneous abscess of umbilicus: Secondary | ICD-10-CM

## 2013-03-28 DIAGNOSIS — Z87891 Personal history of nicotine dependence: Secondary | ICD-10-CM | POA: Insufficient documentation

## 2013-03-28 HISTORY — PX: INCISION AND DRAINAGE ABSCESS: SHX5864

## 2013-03-28 SURGERY — INCISION AND DRAINAGE, ABSCESS
Anesthesia: General | Site: Abdomen

## 2013-03-28 MED ORDER — ACETAMINOPHEN 325 MG PO TABS: 650.0000 mg | ORAL_TABLET | ORAL | Status: DC | PRN

## 2013-03-28 MED ORDER — SODIUM CHLORIDE 0.9 % IJ SOLN: 3.0000 mL | Freq: Two times a day (BID) | INTRAMUSCULAR | Status: DC

## 2013-03-28 MED ORDER — KETOROLAC TROMETHAMINE 30 MG/ML IJ SOLN: 15.0000 mg | Freq: Once | INTRAMUSCULAR | Status: DC | PRN

## 2013-03-28 MED ORDER — CIPROFLOXACIN IN D5W 400 MG/200ML IV SOLN
400.0000 mg | INTRAVENOUS | Status: AC
  Administered 2013-03-28: 400 mg via INTRAVENOUS

## 2013-03-28 MED ORDER — PROPOFOL 10 MG/ML IV BOLUS
INTRAVENOUS | Status: AC
  Filled 2013-03-28: qty 20

## 2013-03-28 MED ORDER — PROPOFOL 10 MG/ML IV BOLUS
INTRAVENOUS | Status: DC | PRN
  Administered 2013-03-28: 200 mg via INTRAVENOUS
  Administered 2013-03-28: 100 mg via INTRAVENOUS

## 2013-03-28 MED ORDER — FENTANYL CITRATE 0.05 MG/ML IJ SOLN
INTRAMUSCULAR | Status: AC
  Filled 2013-03-28: qty 5

## 2013-03-28 MED ORDER — MIDAZOLAM HCL 5 MG/5ML IJ SOLN
INTRAMUSCULAR | Status: DC | PRN
Start: 2013-03-28 — End: 2013-03-28
  Administered 2013-03-28: 2 mg via INTRAVENOUS

## 2013-03-28 MED ORDER — BUPIVACAINE-EPINEPHRINE PF 0.25-1:200000 % IJ SOLN
INTRAMUSCULAR | Status: AC
  Filled 2013-03-28: qty 60

## 2013-03-28 MED ORDER — PROMETHAZINE HCL 25 MG/ML IJ SOLN: 6.2500 mg | INTRAMUSCULAR | Status: DC | PRN

## 2013-03-28 MED ORDER — ACETAMINOPHEN 650 MG RE SUPP
650.0000 mg | RECTAL | Status: DC | PRN
  Filled 2013-03-28: qty 1

## 2013-03-28 MED ORDER — OXYCODONE HCL 5 MG PO TABS: 5.0000 mg | ORAL_TABLET | ORAL | Status: DC | PRN

## 2013-03-28 MED ORDER — FENTANYL CITRATE 0.05 MG/ML IJ SOLN
INTRAMUSCULAR | Status: DC | PRN
  Administered 2013-03-28 (×2): 50 ug via INTRAVENOUS
  Administered 2013-03-28: 25 ug via INTRAVENOUS

## 2013-03-28 MED ORDER — SODIUM CHLORIDE 0.9 % IV SOLN: 250.0000 mL | INTRAVENOUS | Status: DC | PRN

## 2013-03-28 MED ORDER — LACTATED RINGERS IV SOLN
INTRAVENOUS | Status: DC
  Administered 2013-03-28: 1000 mL via INTRAVENOUS
  Administered 2013-03-28 (×2): via INTRAVENOUS

## 2013-03-28 MED ORDER — ONDANSETRON HCL 4 MG/2ML IJ SOLN: 4.0000 mg | Freq: Four times a day (QID) | INTRAMUSCULAR | Status: DC | PRN

## 2013-03-28 MED ORDER — FENTANYL CITRATE 0.05 MG/ML IJ SOLN
25.0000 ug | INTRAMUSCULAR | Status: DC | PRN
Start: 2013-03-28 — End: 2013-03-28

## 2013-03-28 MED ORDER — MIDAZOLAM HCL 2 MG/2ML IJ SOLN
INTRAMUSCULAR | Status: AC
  Filled 2013-03-28: qty 2

## 2013-03-28 MED ORDER — CIPROFLOXACIN IN D5W 400 MG/200ML IV SOLN
INTRAVENOUS | Status: AC
  Filled 2013-03-28: qty 200

## 2013-03-28 MED ORDER — BUPIVACAINE-EPINEPHRINE 0.25% -1:200000 IJ SOLN
INTRAMUSCULAR | Status: DC | PRN
  Administered 2013-03-28: 10 mL

## 2013-03-28 MED ORDER — LIDOCAINE HCL (CARDIAC) 20 MG/ML IV SOLN
INTRAVENOUS | Status: AC
  Filled 2013-03-28: qty 5

## 2013-03-28 MED ORDER — OXYCODONE-ACETAMINOPHEN 5-325 MG PO TABS: 1.0000 | ORAL_TABLET | ORAL | Status: DC | PRN

## 2013-03-28 MED ORDER — LIDOCAINE HCL (CARDIAC) 20 MG/ML IV SOLN
INTRAVENOUS | Status: DC | PRN
  Administered 2013-03-28: 60 mg via INTRAVENOUS
  Administered 2013-03-28: 80 mg via INTRAVENOUS

## 2013-03-28 MED ORDER — SODIUM CHLORIDE 0.9 % IJ SOLN: 3.0000 mL | INTRAMUSCULAR | Status: DC | PRN

## 2013-03-28 SURGICAL SUPPLY — 33 items
BLADE SURG 15 STRL LF DISP TIS (BLADE) ×1 IMPLANT
BLADE SURG 15 STRL SS (BLADE) ×3
BNDG GAUZE ELAST 4 BULKY (GAUZE/BANDAGES/DRESSINGS) ×2 IMPLANT
CANISTER SUCTION 2500CC (MISCELLANEOUS) ×3 IMPLANT
DECANTER SPIKE VIAL GLASS SM (MISCELLANEOUS) IMPLANT
DRAPE LAPAROSCOPIC ABDOMINAL (DRAPES) ×2 IMPLANT
ELECT REM PT RETURN 9FT ADLT (ELECTROSURGICAL) ×3
ELECTRODE REM PT RTRN 9FT ADLT (ELECTROSURGICAL) ×1 IMPLANT
GAUZE PACKING IODOFORM 1/2 (PACKING) ×2 IMPLANT
GLOVE BIO SURGEON STRL SZ 6.5 (GLOVE) ×3 IMPLANT
GLOVE BIO SURGEONS STRL SZ 6.5 (GLOVE) ×2
GLOVE BIOGEL PI IND STRL 7.0 (GLOVE) ×1 IMPLANT
GLOVE BIOGEL PI INDICATOR 7.0 (GLOVE) ×2
GOWN BRE IMP PREV XXLGXLNG (GOWN DISPOSABLE) ×3 IMPLANT
KIT BASIN OR (CUSTOM PROCEDURE TRAY) ×3 IMPLANT
NDL HYPO 25X1 1.5 SAFETY (NEEDLE) IMPLANT
NEEDLE HYPO 25X1 1.5 SAFETY (NEEDLE) ×3 IMPLANT
NS IRRIG 1000ML POUR BTL (IV SOLUTION) ×3 IMPLANT
PACK BASIC VI WITH GOWN DISP (CUSTOM PROCEDURE TRAY) ×3 IMPLANT
PAD ABD 8X10 STRL (GAUZE/BANDAGES/DRESSINGS) IMPLANT
PENCIL BUTTON HOLSTER BLD 10FT (ELECTRODE) ×3 IMPLANT
SPONGE GAUZE 4X4 12PLY (GAUZE/BANDAGES/DRESSINGS) ×2 IMPLANT
SPONGE LAP 18X18 X RAY DECT (DISPOSABLE) ×3 IMPLANT
SUT MNCRL AB 4-0 PS2 18 (SUTURE) IMPLANT
SUT VIC AB 3-0 SH 27 (SUTURE)
SUT VIC AB 3-0 SH 27XBRD (SUTURE) IMPLANT
SWAB COLLECTION DEVICE MRSA (MISCELLANEOUS) ×2 IMPLANT
SYR CONTROL 10ML LL (SYRINGE) ×2 IMPLANT
TAPE CLOTH SURG 4X10 WHT LF (GAUZE/BANDAGES/DRESSINGS) ×2 IMPLANT
TOWEL OR 17X26 10 PK STRL BLUE (TOWEL DISPOSABLE) ×3 IMPLANT
TUBE ANAEROBIC SPECIMEN COL (MISCELLANEOUS) ×2 IMPLANT
WATER STERILE IRR 1000ML POUR (IV SOLUTION) IMPLANT
YANKAUER SUCT BULB TIP NO VENT (SUCTIONS) ×3 IMPLANT

## 2013-03-28 NOTE — Anesthesia Postprocedure Evaluation (Signed)
  Anesthesia Post-op Note  Patient: [name]  Procedure(s) Performed: Procedure(s) (LRB): INCISION AND DRAINAGE ABSCESS (N/A)  Patient Location: PACU  Anesthesia Type: General  Level of Consciousness: awake and alert   Airway and Oxygen Therapy: Patient Spontanous Breathing  Post-op Pain: mild  Post-op Assessment: Post-op Vital signs reviewed, Patient's Cardiovascular Status Stable, Respiratory Function Stable, Patent Airway and No signs of Nausea or vomiting  Last Vitals:  Filed Vitals:   03/28/13 1600  BP: 118/67  Pulse: 72  Temp: 36.8 C  Resp: 17    Post-op Vital Signs: stable   Complications: No apparent anesthesia complications

## 2013-03-28 NOTE — Anesthesia Preprocedure Evaluation (Addendum)
Anesthesia Evaluation  Patient identified by MRN, date of birth, ID band Patient awake    Reviewed: Allergy & Precautions, H&P , NPO status , Patient's Chart, lab work & pertinent test results  Airway Mallampati: II TM Distance: >3 FB Neck ROM: Full    Dental no notable dental hx.    Pulmonary neg pulmonary ROS, former smoker,  breath sounds clear to auscultation  Pulmonary exam normal       Cardiovascular negative cardio ROS  Rhythm:Regular Rate:Normal     Neuro/Psych negative neurological ROS  negative psych ROS   GI/Hepatic negative GI ROS, Neg liver ROS,   Endo/Other  negative endocrine ROS  Renal/GU negative Renal ROS  negative genitourinary   Musculoskeletal negative musculoskeletal ROS (+)   Abdominal   Peds negative pediatric ROS (+)  Hematology negative hematology ROS (+)   Anesthesia Other Findings   Reproductive/Obstetrics negative OB ROS                          Anesthesia Physical Anesthesia Plan  ASA: II  Anesthesia Plan: General   Post-op Pain Management:    Induction: Intravenous  Airway Management Planned: LMA  Additional Equipment:   Intra-op Plan:   Post-operative Plan: Extubation in OR  Informed Consent: I have reviewed the patients History and Physical, chart, labs and discussed the procedure including the risks, benefits and alternatives for the proposed anesthesia with the patient or authorized representative who has indicated his/her understanding and acceptance.   Dental advisory given  Plan Discussed with: CRNA and Surgeon  Anesthesia Plan Comments:         Anesthesia Quick Evaluation

## 2013-03-28 NOTE — Interval H&P Note (Signed)
History and Physical Interval Note:  03/28/2013 1:32 PM  [name]  has presented today for surgery, with the diagnosis of abscess umbilicus  The various methods of treatment have been discussed with the patient and family. After consideration of risks, benefits and other options for treatment, the patient has consented to  Procedure(s) with comments: INCISION AND DRAINAGE ABSCESS (N/A) - Incision and drainage abscess umbilicus as a surgical intervention .  If a hernia is present, we will repair primarily.  The patient's history has been reviewed, patient examined, no change in status, stable for surgery.  I have reviewed the patient's chart and labs.  Questions were answered to the patient's satisfaction.     Vanita Panda, MD  Colorectal and General Surgery [facility]

## 2013-03-28 NOTE — Transfer of Care (Signed)
Immediate Anesthesia Transfer of Care Note  Patient: [name]  Procedure(s) Performed: Procedure(s) with comments: INCISION AND DRAINAGE ABSCESS (N/A) - Incision and drainage abscess umbilicus  Patient Location: PACU  Anesthesia Type:General  Level of Consciousness: awake and alert   Airway & Oxygen Therapy: Patient Spontanous Breathing and Patient connected to face mask oxygen  Post-op Assessment: Report given to PACU RN and Post -op Vital signs reviewed and stable  Post vital signs: Reviewed and stable  Complications: No apparent anesthesia complications

## 2013-03-28 NOTE — Discharge Instructions (Signed)
GENERAL SURGERY: POST OP INSTRUCTIONS  1. DIET: Follow a light bland diet the first 24 hours after arrival home, such as soup, liquids, crackers, etc.  Be sure to include lots of fluids daily.  Avoid fast food or heavy meals as your are more likely to get nauseated.   2. Take your usually prescribed home medications unless otherwise directed. 3. PAIN CONTROL: a. Pain is best controlled by a usual combination of three different methods TOGETHER: i. Ice/Heat ii. Over the counter pain medication iii. Prescription pain medication b. Most patients will experience some swelling and bruising around the incisions.  Ice packs or heating pads (30-60 minutes up to 6 times a day) will help. Use ice for the first few days to help decrease swelling and bruising, then switch to heat to help relax tight/sore spots and speed recovery.  Some people prefer to use ice alone, heat alone, alternating between ice & heat.  Experiment to what works for you.  Swelling and bruising can take several weeks to resolve.   c. It is helpful to take an over-the-counter pain medication regularly for the first few weeks.  Choose one of the following that works best for you: i. Naproxen (Aleve, etc)  Two 220mg  tabs twice a day ii. Ibuprofen (Advil, etc) Three 200mg  tabs four times a day (every meal & bedtime) d. A  prescription for pain medication (such as Percocet, oxycodone, hydrocodone, etc) should be given to you upon discharge.  Take your pain medication as prescribed.  i. If you are having problems/concerns with the prescription medicine (does not control pain, nausea, vomiting, rash, itching, etc), please call us [phone] to see if we need to switch you to a different pain medicine that will work better for you and/or control your side effect better. ii. If you need a refill on your pain medication, please contact your pharmacy.  They will contact our office to request authorization. Prescriptions will not be filled after 5  pm or on week-ends. 4. Avoid getting constipated.  Between the surgery and the pain medications, it is common to experience some constipation.  Increasing fluid intake and taking a fiber supplement (such as Metamucil, Citrucel, FiberCon, MiraLax, etc) 1-2 times a day regularly will usually help prevent this problem from occurring.  A mild laxative (prune juice, Milk of Magnesia, MiraLax, etc) should be taken according to package directions if there are no bowel movements after 48 hours.   5. Wash / shower every day.  You may shower over the dressings as they are waterproof.  Continue to shower over incision(s) after the dressing is off. 6. Remove your dressing and packing starting tomorrow.  Wash the wound with soap and water.  Pack the wound with saline moistened gauze and cover with a dry dressing.  Change this daily.     7. ACTIVITIES as tolerated:   a. You may resume regular (light) daily activities beginning the next day--such as daily self-care, walking, climbing stairs--gradually increasing activities as tolerated.  If you can walk 30 minutes without difficulty, it is safe to try more intense activity such as jogging, treadmill, bicycling, low-impact aerobics, swimming, etc. b. Save the most intensive and strenuous activity for last such as sit-ups, heavy lifting, contact sports, etc  Refrain from any heavy lifting or straining until you are off narcotics for pain control.   c. DO NOT PUSH THROUGH PAIN.  Let pain be your guide: If it hurts to do something, don't do it.  Pain is  your body warning you to avoid that activity for another week until the pain goes down. d. You may drive when you are no longer taking prescription pain medication, you can comfortably wear a seatbelt, and you can safely maneuver your car and apply brakes. e. Bonita Quin may have sexual intercourse when it is comfortable.  8. FOLLOW UP in our office a. Please call [facility] at [phone] to set up an appointment to see your surgeon  in the office for a follow-up appointment approximately 2-3 weeks after your surgery. b. Make sure that you call for this appointment the day you arrive home to insure a convenient appointment time. 9. IF YOU HAVE DISABILITY OR FAMILY LEAVE FORMS, BRING THEM TO THE OFFICE FOR PROCESSING.  DO NOT GIVE THEM TO YOUR DOCTOR.   WHEN TO CALL us [phone]: 1. Poor pain control 2. Reactions / problems with new medications (rash/itching, nausea, etc)  3. Fever over 101.5 F (38.5 C) 4. Worsening swelling or bruising 5. Continued bleeding from incision. 6. Increased pain, redness, or drainage from the incision   The clinic staff is available to answer your questions during regular business hours (8:30am-5pm).  Please dont hesitate to call and ask to speak to one of our nurses for clinical concerns.   If you have a medical emergency, go to the nearest emergency room or call 911.  A surgeon from [facility] is always on call at the [facility], [place] [address], [place], [place]  [address] ? MAIN: [phone] ? TOLL FREE: [phone] ?  FAX [phone] [email or web address]

## 2013-03-28 NOTE — Op Note (Signed)
03/28/2013  2:49 PM  PATIENT:  [name]  38 y.o. female  Patient Care Team: Mali A Snow as PCP - General (Physician Assistant)  PRE-OPERATIVE DIAGNOSIS:  Infraumbilical abscess   POST-OPERATIVE DIAGNOSIS:  Infraumbilical abscess   PROCEDURE:  INCISION AND DRAINAGE ABSCESS  SURGEON:  Surgeon(s): Leighton Ruff, MD  ASSISTANT: none   ANESTHESIA:   local and MAC  EBL:     DRAINS: none   SPECIMEN:  Source of Specimen:  umbilical mass  DISPOSITION OF SPECIMEN:  PATHOLOGY  COUNTS:  YES  PLAN OF CARE: Discharge to home after PACU  PATIENT DISPOSITION:  PACU - hemodynamically stable.  INDICATION: Is a 38 year old who was seen in urgent office earlier today by Dr. Rush Farmer. She has an umbilical mass that is draining purulence. We're here today for incision and drainage.   OR FINDINGS: Infraumbilical mass with purulence noted at the base. Fascia intact at the base.  DESCRIPTION: the patient was identified in the preoperative holding area and taken to the OR where they were laid Supine on the operating room table.  MAC anesthesia was induced without difficulty. SCDs were also noted to be in place prior to the initiation of anesthesia.  The patient was then prepped and draped in the usual sterile fashion.   A surgical timeout was performed indicating the correct patient, procedure, positioning and need for preoperative antibiotics.   I began by evaluating the wound with a hemostat. There were 3 lobular masses in the skin underneath her umbilicus. In the middle of these was an ulceration draining purulence. I resected one of these masses using Bovie electrocautery. There was a pocket of purulence noted deep to this. A probe down to the level of the fascia and met resistance. I do not feel a hernia. I removed the other 2 lobular masses and sent these to pathology for further examination. The wound was then packed with iodoform gauze.  I infused Marcaine with epinephrine  subcutaneously for local anesthesia. A sterile dressing was applied. The patient was then awakened from anesthesia and sent to the post anesthesia care unit in stable condition. All counts were correct per operating room staff.

## 2013-03-28 NOTE — Progress Notes (Signed)
Patient ID: [name], female   DOB: Feb 04, 1976, 38 y.o.   MRN: [id number]  Chief Complaint  Patient presents with  . New Evaluation    eval umb hernia/puss and bleeding thru incision     HPI [name] is a 38 y.o. female.   HPI This is a pleasant female referred by the emergency department for umbilical hernia. She actually had the flu the week of Christmas but eventually after developed sudden umbilical pain. She had a CAT scan and An umbilical hernia was seen containing only fat but with some stranding in the soft tissue. She has now developed increasing discomfort and is draining purulence from the umbilicus. Past Medical History  Diagnosis Date  . Multiple sclerosis     Past Surgical History  Procedure Laterality Date  . Cholecystectomy    . Cesarean section      History reviewed. No pertinent family history.  Social History History  Substance Use Topics  . Smoking status: Former Smoker    Quit date: 03/28/2001  . Smokeless tobacco: Former NeurosurgeonUser    Quit date: 03/25/2011  . Alcohol Use: No    Allergies  Allergen Reactions  . Penicillins Hives    Current Outpatient Prescriptions  Medication Sig Dispense Refill  . ALPRAZolam (XANAX) 0.5 MG tablet Take 0.5 mg by mouth 2 (two) times daily as needed for anxiety.      . diazepam (VALIUM) 2 MG tablet Take 2 mg by mouth every 6 (six) hours as needed for anxiety.      Marland Kitchen. ibuprofen (ADVIL,MOTRIN) 200 MG tablet Take 200 mg by mouth every 6 (six) hours as needed for fever.      Marland Kitchen. oxyCODONE-acetaminophen (PERCOCET/ROXICET) 5-325 MG per tablet Take 2 tablets by mouth every 4 (four) hours as needed for severe pain.  6 tablet  0  . promethazine (PHENERGAN) 25 MG tablet Take 1 tablet (25 mg total) by mouth every 6 (six) hours as needed for nausea or vomiting.  12 tablet  0   No current facility-administered medications for this visit.    Review of Systems Review of Systems  Constitutional: Negative for fever, chills  and unexpected weight change.  HENT: Negative for congestion, hearing loss, sore throat, trouble swallowing and voice change.   Eyes: Negative for visual disturbance.  Respiratory: Negative for cough and wheezing.   Cardiovascular: Negative for chest pain, palpitations and leg swelling.  Gastrointestinal: Negative for nausea, vomiting, abdominal pain, diarrhea, constipation, blood in stool, abdominal distention and anal bleeding.  Genitourinary: Negative for hematuria, vaginal bleeding and difficulty urinating.  Musculoskeletal: Negative for arthralgias.  Skin: Negative for rash and wound.  Neurological: Negative for seizures, syncope and headaches.  Hematological: Negative for adenopathy. Does not bruise/bleed easily.  Psychiatric/Behavioral: Negative for confusion.    Blood pressure 122/90, pulse 68, temperature 98.3 F (36.8 C), temperature source Temporal, resp. rate 14, height 5\' 6"  (1.676 m), weight 188 lb 3.2 oz (85.367 kg), last menstrual period 03/04/2013.  Physical Exam Physical Exam  Constitutional: She is oriented to person, place, and time. She appears well-developed and well-nourished. She appears distressed.  HENT:  Head: Normocephalic and atraumatic.  Right Ear: External ear normal.  Left Ear: External ear normal.  Nose: Nose normal.  Mouth/Throat: Oropharynx is clear and moist.  Eyes: Conjunctivae are normal. Pupils are equal, round, and reactive to light.  Neck: Normal range of motion. Neck supple. No tracheal deviation present.  Cardiovascular: Normal rate, regular rhythm, normal heart sounds and intact  distal pulses.   No murmur heard. Pulmonary/Chest: Effort normal and breath sounds normal. No respiratory distress. She has no wheezes.  Abdominal: Soft. There is tenderness. There is guarding.  She has multiple scars on her abdomen with keloids. There is a keloid at the umbilicus at the old scar which is draining purulence and is significantly tender   Lymphadenopathy:    She has no cervical adenopathy.  Neurological: She is alert and oriented to person, place, and time.  Skin: Skin is warm and dry. There is erythema.  Psychiatric: Her behavior is normal. Judgment normal.    Data Reviewed I have reviewed her CAT scan of the abdomen and pelvis.  Assessment    Periumbilical abscess     Plan    I believe this is probably an abscess to begin with and not the umbilical hernia causing her pain. She is too much tenderness for an incision and drainage in the office. I believe she will need  Incision and drainage in the operating room under general anesthesia. I've discussed this with Dr. Maisie Fus who is our on-call physician. I will send her over to short stay at [facility] 03/28/2013, 11:50 AM

## 2013-03-28 NOTE — H&P (View-Only) (Signed)
Patient ID: [name], female   DOB: 11/29/1975, 38 y.o.   MRN: [id number]  Chief Complaint  Patient presents with  . New Evaluation    eval umb hernia/puss and bleeding thru incision     HPI [name] is a 38 y.o. female.   HPI This is a pleasant female referred by the emergency department for umbilical hernia. She actually had the flu the week of Christmas but eventually after developed sudden umbilical pain. She had a CAT scan and An umbilical hernia was seen containing only fat but with some stranding in the soft tissue. She has now developed increasing discomfort and is draining purulence from the umbilicus. Past Medical History  Diagnosis Date  . Multiple sclerosis     Past Surgical History  Procedure Laterality Date  . Cholecystectomy    . Cesarean section      History reviewed. No pertinent family history.  Social History History  Substance Use Topics  . Smoking status: Former Smoker    Quit date: 03/28/2001  . Smokeless tobacco: Former User    Quit date: 03/25/2011  . Alcohol Use: No    Allergies  Allergen Reactions  . Penicillins Hives    Current Outpatient Prescriptions  Medication Sig Dispense Refill  . ALPRAZolam (XANAX) 0.5 MG tablet Take 0.5 mg by mouth 2 (two) times daily as needed for anxiety.      . diazepam (VALIUM) 2 MG tablet Take 2 mg by mouth every 6 (six) hours as needed for anxiety.      . ibuprofen (ADVIL,MOTRIN) 200 MG tablet Take 200 mg by mouth every 6 (six) hours as needed for fever.      . oxyCODONE-acetaminophen (PERCOCET/ROXICET) 5-325 MG per tablet Take 2 tablets by mouth every 4 (four) hours as needed for severe pain.  6 tablet  0  . promethazine (PHENERGAN) 25 MG tablet Take 1 tablet (25 mg total) by mouth every 6 (six) hours as needed for nausea or vomiting.  12 tablet  0   No current facility-administered medications for this visit.    Review of Systems Review of Systems  Constitutional: Negative for fever, chills  and unexpected weight change.  HENT: Negative for congestion, hearing loss, sore throat, trouble swallowing and voice change.   Eyes: Negative for visual disturbance.  Respiratory: Negative for cough and wheezing.   Cardiovascular: Negative for chest pain, palpitations and leg swelling.  Gastrointestinal: Negative for nausea, vomiting, abdominal pain, diarrhea, constipation, blood in stool, abdominal distention and anal bleeding.  Genitourinary: Negative for hematuria, vaginal bleeding and difficulty urinating.  Musculoskeletal: Negative for arthralgias.  Skin: Negative for rash and wound.  Neurological: Negative for seizures, syncope and headaches.  Hematological: Negative for adenopathy. Does not bruise/bleed easily.  Psychiatric/Behavioral: Negative for confusion.    Blood pressure 122/90, pulse 68, temperature 98.3 F (36.8 C), temperature source Temporal, resp. rate 14, height 5' 6" (1.676 m), weight 188 lb 3.2 oz (85.367 kg), last menstrual period 03/04/2013.  Physical Exam Physical Exam  Constitutional: She is oriented to person, place, and time. She appears well-developed and well-nourished. She appears distressed.  HENT:  Head: Normocephalic and atraumatic.  Right Ear: External ear normal.  Left Ear: External ear normal.  Nose: Nose normal.  Mouth/Throat: Oropharynx is clear and moist.  Eyes: Conjunctivae are normal. Pupils are equal, round, and reactive to light.  Neck: Normal range of motion. Neck supple. No tracheal deviation present.  Cardiovascular: Normal rate, regular rhythm, normal heart sounds and intact   distal pulses.   No murmur heard. Pulmonary/Chest: Effort normal and breath sounds normal. No respiratory distress. She has no wheezes.  Abdominal: Soft. There is tenderness. There is guarding.  She has multiple scars on her abdomen with keloids. There is a keloid at the umbilicus at the old scar which is draining purulence and is significantly tender   Lymphadenopathy:    She has no cervical adenopathy.  Neurological: She is alert and oriented to person, place, and time.  Skin: Skin is warm and dry. There is erythema.  Psychiatric: Her behavior is normal. Judgment normal.    Data Reviewed I have reviewed her CAT scan of the abdomen and pelvis.  Assessment    Periumbilical abscess     Plan    I believe this is probably an abscess to begin with and not the umbilical hernia causing her pain. She is too much tenderness for an incision and drainage in the office. I believe she will need  Incision and drainage in the operating room under general anesthesia. I've discussed this with Dr. Maisie Fus who is our on-call physician. I will send her over to short stay at [facility] 03/28/2013, 11:50 AM

## 2013-03-31 ENCOUNTER — Ambulatory Visit (INDEPENDENT_AMBULATORY_CARE_PROVIDER_SITE_OTHER): Payer: Managed Care, Other (non HMO) | Admitting: Surgery

## 2013-03-31 ENCOUNTER — Encounter (HOSPITAL_COMMUNITY): Payer: Self-pay | Admitting: General Surgery

## 2013-04-08 ENCOUNTER — Telehealth (INDEPENDENT_AMBULATORY_CARE_PROVIDER_SITE_OTHER): Payer: Self-pay

## 2013-04-08 NOTE — Telephone Encounter (Signed)
Left message for pt to call.  She needs a po appointment.  If she is doing well, I have scheduled her for 04/23/2013 at 12, arrive by 11:45 to check in.

## 2013-04-08 NOTE — Telephone Encounter (Signed)
I called the pt back.  She is doing well packing her wound once daily.  She was concerned waiting until the 28 th to be seen.  I told her Dr Maisie Fus said it was ok to wait until that week.  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  I had an opening at 11 and she said she will take it.  I scheduled her to come in tomorrow.

## 2013-04-09 ENCOUNTER — Encounter (INDEPENDENT_AMBULATORY_CARE_PROVIDER_SITE_OTHER): Payer: Self-pay | Admitting: General Surgery

## 2013-04-09 ENCOUNTER — Ambulatory Visit (INDEPENDENT_AMBULATORY_CARE_PROVIDER_SITE_OTHER): Payer: Managed Care, Other (non HMO) | Admitting: General Surgery

## 2013-04-09 VITALS — BP 126/82 | HR 72 | Temp 98.3°F | Resp 14 | Ht 66.0 in | Wt 188.4 lb

## 2013-04-09 DIAGNOSIS — Z9889 Other specified postprocedural states: Secondary | ICD-10-CM

## 2013-04-09 NOTE — Progress Notes (Signed)
[name] is a 38 y.o. female who is status post a I&D of an umbilical abscess on 1/2.  She is doing better.  She has been packing her wound.  Her pain is better.  Objective: Filed Vitals:   04/09/13 1124  BP: 126/82  Pulse: 72  Temp: 98.3 F (36.8 C)  Resp: 14    General appearance: alert and cooperative GI: normal findings: soft, non-tender  Incision: healing well   Assessment: s/p I&D  Plan: Her wound is healing well. She does not need to pack this anymore. It is okay for her to go back to work. She will keep a dry dressing on until the area heals completely. I will see her back on as needed basis.    Vanita Panda, MD [facility], [place] [phone]   04/09/2013 11:34 AM

## 2013-04-09 NOTE — Patient Instructions (Signed)
Keep covered until no drainage.  Ok to go back to work.

## 2013-04-10 ENCOUNTER — Ambulatory Visit (INDEPENDENT_AMBULATORY_CARE_PROVIDER_SITE_OTHER): Payer: Managed Care, Other (non HMO) | Admitting: General Surgery

## 2013-04-23 ENCOUNTER — Encounter (INDEPENDENT_AMBULATORY_CARE_PROVIDER_SITE_OTHER): Payer: Managed Care, Other (non HMO) | Admitting: General Surgery

## 2014-01-15 ENCOUNTER — Emergency Department
Admission: EM | Admit: 2014-01-15 | Discharge: 2014-01-16 | Disposition: A | Discharge status: Home / Self Care | Payer: Medicaid Other | Attending: Emergency Medicine | Admitting: Emergency Medicine

## 2014-01-15 ENCOUNTER — Emergency Department: Payer: Medicaid Other

## 2014-01-15 DIAGNOSIS — R509 Fever, unspecified: Secondary | ICD-10-CM | POA: Insufficient documentation

## 2014-01-15 DIAGNOSIS — R05 Cough: Secondary | ICD-10-CM | POA: Insufficient documentation

## 2014-01-15 DIAGNOSIS — R7989 Other specified abnormal findings of blood chemistry: Secondary | ICD-10-CM

## 2014-01-15 DIAGNOSIS — R079 Chest pain, unspecified: Secondary | ICD-10-CM | POA: Insufficient documentation

## 2014-01-15 DIAGNOSIS — R091 Pleurisy: Secondary | ICD-10-CM | POA: Insufficient documentation

## 2014-01-15 DIAGNOSIS — G35 Multiple sclerosis: Secondary | ICD-10-CM | POA: Insufficient documentation

## 2014-01-15 DIAGNOSIS — D649 Anemia, unspecified: Secondary | ICD-10-CM | POA: Insufficient documentation

## 2014-01-15 DIAGNOSIS — M546 Pain in thoracic spine: Secondary | ICD-10-CM | POA: Insufficient documentation

## 2014-01-15 DIAGNOSIS — Z87891 Personal history of nicotine dependence: Secondary | ICD-10-CM | POA: Insufficient documentation

## 2014-01-15 DIAGNOSIS — R059 Cough, unspecified: Secondary | ICD-10-CM

## 2014-01-15 HISTORY — DX: Pneumonia, unspecified organism: J18.9

## 2014-01-15 HISTORY — DX: Multiple sclerosis: G35

## 2014-01-15 MED ORDER — SODIUM CHLORIDE 0.9 % IV BOLUS
1000.0000 mL | Freq: Once | INTRAVENOUS | Status: AC
Start: 2014-01-15 — End: 2014-01-16
  Administered 2014-01-15: 1000 mL via INTRAVENOUS

## 2014-01-15 NOTE — ED Notes (Signed)
Pt CO midsteral and right sided chest pain that radiates under breast and into upper back since last night. Pt states she thought it was related to gas but it continued to get worse so she came here. Pt states the pain is worse with deep breathing or coughing but is constant. Pt states she doesn't feel like she can't take a deep breathe. Pt states she has had a dry cough for a few weeks.

## 2014-01-16 ENCOUNTER — Emergency Department: Payer: Medicaid Other

## 2014-01-16 LAB — CBC AND DIFFERENTIAL
Basophils Absolute Automated: 0.02 10*3/uL (ref 0.00–0.20)
Basophils Automated: 0 %
Eosinophils Absolute Automated: 0.05 10*3/uL (ref 0.00–0.70)
Eosinophils Automated: 1 %
Hematocrit: 31.5 % — ABNORMAL LOW (ref 37.0–47.0)
Hgb: 10 g/dL — ABNORMAL LOW (ref 12.0–16.0)
Immature Granulocytes Absolute: 0.01 10*3/uL
Immature Granulocytes: 0 %
Lymphocytes Absolute Automated: 0.89 10*3/uL (ref 0.50–4.40)
Lymphocytes Automated: 16 %
MCH: 27.3 pg — ABNORMAL LOW (ref 28.0–32.0)
MCHC: 31.7 g/dL — ABNORMAL LOW (ref 32.0–36.0)
MCV: 86.1 fL (ref 80.0–100.0)
MPV: 10.5 fL (ref 9.4–12.3)
Monocytes Absolute Automated: 0.59 10*3/uL (ref 0.00–1.20)
Monocytes: 10 %
Neutrophils Absolute: 4.07 10*3/uL (ref 1.80–8.10)
Neutrophils: 72 %
Platelets: 269 10*3/uL (ref 140–400)
RBC: 3.66 10*6/uL — ABNORMAL LOW (ref 4.20–5.40)
RDW: 16 % — ABNORMAL HIGH (ref 12–15)
WBC: 5.62 10*3/uL (ref 3.50–10.80)

## 2014-01-16 LAB — COMPREHENSIVE METABOLIC PANEL
ALT: 6 U/L (ref 0–55)
AST (SGOT): 11 U/L (ref 5–34)
Albumin/Globulin Ratio: 1.1 (ref 0.9–2.2)
Albumin: 3.8 g/dL (ref 3.5–5.0)
Alkaline Phosphatase: 49 U/L (ref 37–106)
Anion Gap: 9 (ref 5.0–15.0)
BUN: 6 mg/dL — ABNORMAL LOW (ref 7.0–19.0)
Bilirubin, Total: 0.3 mg/dL (ref 0.2–1.2)
CO2: 25 mEq/L (ref 22–29)
Calcium: 8.8 mg/dL (ref 8.5–10.5)
Chloride: 103 mEq/L (ref 100–111)
Creatinine: 0.7 mg/dL (ref 0.6–1.0)
Globulin: 3.5 g/dL (ref 2.0–3.6)
Glucose: 124 mg/dL — ABNORMAL HIGH (ref 70–100)
Potassium: 3.8 mEq/L (ref 3.5–5.1)
Protein, Total: 7.3 g/dL (ref 6.0–8.3)
Sodium: 137 mEq/L (ref 136–145)

## 2014-01-16 LAB — LACTIC ACID, PLASMA: Lactic Acid: 1 mmol/L (ref 0.5–2.2)

## 2014-01-16 LAB — HCG, SERUM, QUALITATIVE: Hcg Qualitative: NEGATIVE

## 2014-01-16 LAB — IHS D-DIMER: D-Dimer: 0.74 ug/mL FEU — ABNORMAL HIGH (ref 0.00–0.51)

## 2014-01-16 LAB — TROPONIN I: Troponin I: <0.01 ng/mL (ref 0.00–0.09)

## 2014-01-16 LAB — GFR: EGFR: >60

## 2014-01-16 MED ORDER — SODIUM CHLORIDE 0.9 % IV BOLUS
1000.0000 mL | Freq: Once | INTRAVENOUS | Status: AC
Start: 2014-01-16 — End: 2014-01-16
  Administered 2014-01-16: 1000 mL via INTRAVENOUS

## 2014-01-16 MED ORDER — PROMETHAZINE HCL 25 MG PO TABS
25.0000 mg | ORAL_TABLET | Freq: Four times a day (QID) | ORAL | Status: DC | PRN
Start: 2014-01-16 — End: 2014-06-27

## 2014-01-16 MED ORDER — PROMETHAZINE HCL 25 MG/ML IJ SOLN
12.5000 mg | Freq: Once | INTRAMUSCULAR | Status: AC
Start: 2014-01-16 — End: 2014-01-16
  Administered 2014-01-16: 12.5 mg via INTRAVENOUS
  Filled 2014-01-16: qty 1

## 2014-01-16 MED ORDER — OXYCODONE-ACETAMINOPHEN 5-325 MG PO TABS
ORAL_TABLET | ORAL | Status: DC
Start: 2014-01-16 — End: 2014-06-27

## 2014-01-16 MED ORDER — IOHEXOL 350 MG/ML IV SOLN
100.0000 mL | Freq: Once | INTRAVENOUS | Status: AC | PRN
Start: 2014-01-16 — End: 2014-01-16
  Administered 2014-01-16: 100 mL via INTRAVENOUS

## 2014-01-16 MED ORDER — PROMETHAZINE HCL 25 MG PO TABS
25.0000 mg | ORAL_TABLET | Freq: Once | ORAL | Status: AC
Start: 2014-01-16 — End: 2014-01-16
  Administered 2014-01-16: 25 mg via ORAL
  Filled 2014-01-16: qty 1

## 2014-01-16 MED ORDER — KETOROLAC TROMETHAMINE 30 MG/ML IJ SOLN
30.0000 mg | Freq: Once | INTRAMUSCULAR | Status: AC
Start: 2014-01-16 — End: 2014-01-16
  Administered 2014-01-16: 30 mg via INTRAVENOUS
  Filled 2014-01-16: qty 1

## 2014-01-16 MED ORDER — OXYCODONE-ACETAMINOPHEN 5-325 MG PO TABS
2.0000 | ORAL_TABLET | Freq: Once | ORAL | Status: AC
Start: 2014-01-16 — End: 2014-01-16
  Administered 2014-01-16: 2 via ORAL
  Filled 2014-01-16: qty 2

## 2014-01-16 MED ORDER — NAPROXEN 250 MG PO TABS
250.0000 mg | ORAL_TABLET | Freq: Two times a day (BID) | ORAL | Status: AC
Start: 2014-01-16 — End: 2014-01-23

## 2014-01-16 MED ORDER — HYDROMORPHONE HCL 1 MG/ML IJ SOLN
1.0000 mg | Freq: Once | INTRAMUSCULAR | Status: AC
Start: 2014-01-16 — End: 2014-01-16
  Administered 2014-01-16: 1 mg via INTRAVENOUS
  Filled 2014-01-16: qty 1

## 2014-01-16 MED ORDER — AZITHROMYCIN 250 MG PO TABS
500.0000 mg | ORAL_TABLET | Freq: Once | ORAL | Status: AC
Start: 2014-01-16 — End: 2014-01-16
  Administered 2014-01-16: 500 mg via ORAL
  Filled 2014-01-16: qty 2

## 2014-01-16 MED ORDER — ACETAMINOPHEN 500 MG PO TABS
1000.0000 mg | ORAL_TABLET | Freq: Once | ORAL | Status: AC
Start: 2014-01-16 — End: 2014-01-16
  Administered 2014-01-16: 1000 mg via ORAL
  Filled 2014-01-16: qty 2

## 2014-01-16 MED ORDER — AZITHROMYCIN 250 MG PO TABS
250.0000 mg | ORAL_TABLET | Freq: Every day | ORAL | Status: AC
Start: 2014-01-16 — End: 2014-01-20

## 2014-01-16 NOTE — Discharge Instructions (Signed)
Anemia    You have been diagnosed with anemia.    Anemia means "a low red blood cell count." Red blood cells are a part of your blood. These carry oxygen. Blood also has white blood cells, which fight infection and platelets, which help blood to clot.    Symptoms of anemia include fatigue (feeling tired) and weakness. Symptoms also include shortness of breath or chest pain with exercise or even normal activity. Another sign is pale color of the skin, lips and fingernail beds.    Anemia can have many causes. These include:   Ongoing (continual) blood loss. Sometimes there can be a slow "leak" of blood into the bowels. It can also happen with menstruation (menstrual period). Over time, the blood loss adds up. Then your blood count can get too low.   Iron deficiency: Iron is needed to make new red blood cells. Sometimes iron intake is too low for the body s needs.   Vitamin deficiency: The body needs vitamin B12 and folic acid to make new red blood cells. If intake of these vitamins is too low from poor nutrition or too much alcohol, you can become anemic.   Chronic diseases: Some medical illnesses cause low blood count. This is especially the case for those with generalized inflammation.   Kidney disease: Patients with long-term kidney problems can get anemia.   Blood breakdown: Some diseases cause the blood cells in the blood stream to be destroyed or broken down. This can cause a low blood count.    The exact cause of your anemia is not known at this time. You may have had tests to see why you are anemic. You can get the results soon. See your primary care doctor or the referral doctor for more evaluation.    After an evaluation, the doctor thinks your blood count IS NOT so low that you need a blood transfusion. Follow-up with your regular doctor for more rechecks on your blood count.    YOU SHOULD SEEK MEDICAL ATTENTION IMMEDIATELY, EITHER HERE OR AT THE NEAREST EMERGENCY DEPARTMENT, IF ANY OF THE  FOLLOWING OCCURS:   You get light-headed and dizzy as if about to faint.   You get worsening shortness of breath during regular activities like walking or climbing stairs.   You get chest pain during regular activities like walking or climbing stairs.   IF YOU WERE BLEEDING.Marland KitchenMarland KitchenIf bleeding gets worse.              Cough    You have been seen for your cough.    There are many possible causes of cough. Most are not dangerous. Your doctor has determined that it is OK for you to go home today.    Your doctor believes your cough was caused by bacteria. Your doctor prescribed an antibiotic that will fight the bacteria.    The doctor may have prescribed some medicine to help with your cough. Use the medicine as directed.    YOU SHOULD SEEK MEDICAL ATTENTION IMMEDIATELY, EITHER HERE OR AT THE NEAREST EMERGENCY DEPARTMENT, IF ANY OF THE FOLLOWING OCCURS:   You wheeze or have trouble breathing.   You cough up mucous or lose weight for no reason.   You have a fever (temperature higher than 100.9F / 38C) that lasts more than 5 days.   You have chest pain.   Your symptoms get worse or do not get better in 2 or 3 days.   You have any new problems or concerns.  Pleurisy    You have been diagnosed with pleurisy.    Pleurisy is a swelling or irritation. It affects the chest cavity lining and lungs and causes sharp chest pain. The pain is worse when breathing in (inhaling).    There are other more serious problems that can cause pain with breathing, however. The doctor who saw you today believes your problem is not serious.    You had a helical (spiral) CT scan of your chest. This was to look for blood clots in the lung. The test results show it is unlikely that you have a blood clot in the lung.    Treatment may include an anti-inflammatory medicine. If you were given a prescription for an anti-inflammatory medicine, do not take other over-the-counter (without prescription) anti-inflammatories like  ibuprofen (Advil or Motrin) and naproxen (Naprosyn or Aleve).    Take deep breaths. This will keep your lungs expanded and help prevent partial lung collapse (atelectasis) from developing. It will also prevent pneumonia.    YOU SHOULD SEEK MEDICAL ATTENTION IMMEDIATELY, EITHER HERE OR AT THE NEAREST EMERGENCY DEPARTMENT, IF ANY OF THE FOLLOWING OCCURS:   Trouble breathing or wheezing.   Coughing up blood.   Fever (temperature higher than 100.55F / 38C) or any fever that doesn't go away.   Chest pain.   No improvement in 48 to 72 hours or symptoms get worse.      Naprosyn for pain and inflammation and add percocet for more severe pain.

## 2014-01-16 NOTE — ED Provider Notes (Signed)
Physician/Midlevel provider first contact with patient: 01/15/14 2318         History     Chief Complaint   Patient presents with   . Chest Pain     HPI Comments: Pt with right cp with rads to back worse with deep breath and mov't with no similar- no other sob but dry cough for two weeks- hx of double pneumonia with sepsis in 2011 and ? similar- fever noted today- no other complaints    Patient is a 38 y.o. female presenting with chest pain. The history is provided by the patient.   Chest Pain  Pain location:  R chest  Pain quality: aching    Pain radiates to:  Upper back  Pain radiates to the back: yes    Pain severity:  Moderate  Onset quality:  Gradual  Duration:  1 day (onset yesterday)  Timing:  Constant  Progression:  Unchanged  Chronicity:  New  Relieved by:  Nothing  Worsened by:  Certain positions and deep breathing  Ineffective treatments: alka seltzer.  Associated symptoms: back pain and cough    Associated symptoms: no abdominal pain, no fever, no headache, no nausea, no shortness of breath and not vomiting             Past Medical History   Diagnosis Date   . MS (multiple sclerosis)    . Pneumonia 2011, 2012   . Sepsis 2011       History reviewed. No pertinent past surgical history.    No family history on file.    Social  History   Substance Use Topics   . Smoking status: Former Games developer   . Smokeless tobacco: Not on file   . Alcohol Use: Not on file       .     Allergies   Allergen Reactions   . Penicillins        Current/Home Medications    ACETAMINOPHEN (TYLENOL) 325 MG TABLET    Take 650 mg by mouth.    ALPRAZOLAM (XANAX) 1 MG TABLET    Take 1 mg by mouth nightly as needed for Sleep.    BACLOFEN (LIORESAL) 10 MG TABLET    Take 10 mg by mouth 3 (three) times daily.        Review of Systems   Constitutional: Positive for appetite change. Negative for fever and activity change.   HENT: Negative for congestion, rhinorrhea and sore throat.    Respiratory: Positive for cough. Negative for shortness of  breath.    Cardiovascular: Positive for chest pain.   Gastrointestinal: Negative for nausea, vomiting, abdominal pain, diarrhea and constipation.   Genitourinary: Negative for dysuria and difficulty urinating.        Lmp beginning of october   Musculoskeletal: Positive for back pain and neck pain (chronic pain from ms with no changes).        No trauma   Skin: Negative for color change and rash.   Neurological: Negative for syncope and headaches.   All other systems reviewed and are negative.      Physical Exam    BP: 121/87 mmHg, Heart Rate: 93, Temp: 100.4 F (38 C), Resp Rate: 18, SpO2: 100 %, Weight: 81.194 kg    Physical Exam   Constitutional: She is oriented to person, place, and time. She appears well-developed and well-nourished.  Non-toxic appearance. She does not have a sickly appearance. She does not appear ill. She appears distressed.   Elevated temp  HENT:   Head: Normocephalic and atraumatic.   Right Ear: External ear normal.   Left Ear: External ear normal.   Nose: Nose normal.   Mouth/Throat: Oropharynx is clear and moist and mucous membranes are normal.   Eyes: Conjunctivae, EOM and lids are normal. Pupils are equal, round, and reactive to light.   Neck: Trachea normal and full passive range of motion without pain. Neck supple. No spinous process tenderness present.   Cardiovascular: Normal rate, regular rhythm, normal heart sounds and normal pulses.    Pulmonary/Chest: Effort normal and breath sounds normal. She exhibits no tenderness, no bony tenderness and no deformity.       Abdominal: Soft. Normal appearance and bowel sounds are normal. She exhibits no distension. There is no tenderness. There is no rebound and no guarding.   Musculoskeletal: Normal range of motion.        Thoracic back: She exhibits pain. She exhibits normal range of motion, no tenderness and no bony tenderness.        Lumbar back: She exhibits normal range of motion, no tenderness, no bony tenderness and no pain.         Back:    Ext nt with full rom and nvi   Lymphadenopathy:     She has no cervical adenopathy.   Neurological: She is alert and oriented to person, place, and time. She has normal strength. No cranial nerve deficit or sensory deficit. GCS eye subscore is 4. GCS verbal subscore is 5. GCS motor subscore is 6.   Skin: Skin is warm, dry and intact. No rash noted.   Psychiatric: She has a normal mood and affect.   Vitals reviewed.      MDM and ED Course     ED Medication Orders     Start     Status Ordering Provider    01/16/14 0348  azithromycin [facility]) tablet 500 mg   Once     Route: Oral  Ordered Dose: 500 mg     Ordered Archie Atilano    01/16/14 0347  oxyCODONE-acetaminophen (PERCOCET) 5-325 MG per tablet 2 tablet   Once     Comments:  To go   Route: Oral  Ordered Dose: 2 tablet     Ordered Layal Javid    01/16/14 0347  promethazine (PHENERGAN) tablet 25 mg   Once     Comments:  To go   Route: Oral  Ordered Dose: 25 mg     Ordered Abbagayle Zaragoza    01/16/14 0235  ketorolac (TORADOL) injection 30 mg   Once     Route: Intravenous  Ordered Dose: 30 mg     Last MAR action:  Given Laurian Edrington    01/16/14 0041  acetaminophen (TYLENOL) tablet 1,000 mg   Once     Route: Oral  Ordered Dose: 1,000 mg     Last MAR action:  Given Bonetta Mostek    01/16/14 0041  HYDROmorphone (DILAUDID) injection 1 mg   Once     Route: Intravenous  Ordered Dose: 1 mg     Last MAR action:  Given Codey Burling    01/16/14 0041  promethazine (PHENERGAN) injection 12.5 mg   Once     Route: Intravenous  Ordered Dose: 12.5 mg     Last MAR action:  Given Jaylia Pettus    01/16/14 0041  sodium chloride 0.9 % bolus 1,000 mL   Once     Route: Intravenous  Ordered Dose: 1,000 mL  Last MAR action:  New Bag [place] Nellums, [facility]    01/15/14 2344  sodium chloride 0.9 % bolus 1,000 mL   Once     Route: Intravenous  Ordered Dose: 1,000 mL     Last MAR action:  Stopped Darilyn Storbeck              MDM  Number of  Diagnoses or Management Options  Acute chest pain:   Acute thoracic back pain:   Anemia, unspecified anemia type:   Cough:   Elevated d-dimer:   Fever, unspecified fever cause:   Pleurisy:   Diagnosis management comments: EKG Interpretation:    Rhythm:  Normal Sinus  Rate:  Normal, 92  Axis:  Normal  Conduction:  No blocks  ST/T Segments:  Normal ST segments    I, Michaelle Copas, MD, have been the primary provider for Gurleen Watrous during this Emergency Dept visit.    Oxygen saturation by pulse oximetry is 95%-100%, Normal.  Interventions: None Needed    Right cp with rads to back in young pt with ms- r/o pneumonia and pe with cad unlikely- support and dispo pending    Pect, flu, and lactate pending, support and dispo pending    pect pending, support and dispo pending    Anemia and elev ddimer with neg pect d/w pt- pt feeling better- f/u pcp and support pleurisy - abx for cough with hx of sig pneumonia in past- f/u pcp in 3 days         Amount and/or Complexity of Data Reviewed  Clinical lab tests: ordered and reviewed  Tests in the radiology section of CPT: reviewed and ordered           Procedures    Clinical Impression & Disposition     Clinical Impression  Final diagnoses:   Anemia, unspecified anemia type   Elevated d-dimer   Acute chest pain   Fever, unspecified fever cause   Cough   Acute thoracic back pain   Pleurisy        ED Disposition     Discharge Deborahann Yom discharge to home/self care.    Condition at disposition: Stable             New Prescriptions    AZITHROMYCIN (ZITHROMAX) 250 MG TABLET    Take 1 tablet (250 mg total) by mouth daily. Start tomorrow    NAPROXEN (NAPROSYN) 250 MG TABLET    Take 1 tablet (250 mg total) by mouth 2 (two) times daily with meals. As needed for pain    OXYCODONE-ACETAMINOPHEN (PERCOCET) 5-325 MG PER TABLET    1-2 tablets by mouth every 6 hours as needed for severe pain;  Do not drive or operate machinery while taking this medicine    PROMETHAZINE (PHENERGAN) 25 MG  TABLET    Take 1 tablet (25 mg total) by mouth every 6 (six) hours as needed for Nausea.                 Michaelle Copas, MD  01/16/14 [phone]

## 2014-01-17 LAB — ECG 12-LEAD
Atrial Rate: 92 {beats}/min
P Axis: 26 degrees
P-R Interval: 150 ms
Q-T Interval: 354 ms
QRS Duration: 82 ms
QTC Calculation (Bezet): 437 ms
R Axis: 11 degrees
T Axis: 40 degrees
Ventricular Rate: 92 {beats}/min

## 2014-06-04 ENCOUNTER — Emergency Department
Admission: EM | Admit: 2014-06-04 | Discharge: 2014-06-04 | Disposition: A | Discharge status: Home / Self Care | Payer: BC Managed Care – PPO | Attending: Emergency Medicine | Admitting: Emergency Medicine

## 2014-06-04 ENCOUNTER — Emergency Department: Payer: BC Managed Care – PPO

## 2014-06-04 DIAGNOSIS — R51 Headache: Secondary | ICD-10-CM | POA: Insufficient documentation

## 2014-06-04 DIAGNOSIS — IMO0001 Reserved for inherently not codable concepts without codable children: Secondary | ICD-10-CM

## 2014-06-04 DIAGNOSIS — G35 Multiple sclerosis: Secondary | ICD-10-CM | POA: Insufficient documentation

## 2014-06-04 DIAGNOSIS — R519 Headache, unspecified: Secondary | ICD-10-CM

## 2014-06-04 DIAGNOSIS — R21 Rash and other nonspecific skin eruption: Secondary | ICD-10-CM

## 2014-06-04 DIAGNOSIS — R030 Elevated blood-pressure reading, without diagnosis of hypertension: Secondary | ICD-10-CM | POA: Insufficient documentation

## 2014-06-04 MED ORDER — HYDROCODONE-ACETAMINOPHEN 5-325 MG PO TABS
2.0000 | ORAL_TABLET | Freq: Once | ORAL | Status: AC
Start: 2014-06-04 — End: 2014-06-04
  Administered 2014-06-04: 2 via ORAL
  Filled 2014-06-04: qty 2

## 2014-06-04 MED ORDER — PERMETHRIN 5 % EX CREA
TOPICAL_CREAM | Freq: Once | CUTANEOUS | Status: AC
Start: 2014-06-04 — End: 2014-06-04

## 2014-06-04 MED ORDER — HYDROCODONE-ACETAMINOPHEN 5-325 MG PO TABS
2.0000 | ORAL_TABLET | Freq: Four times a day (QID) | ORAL | Status: DC | PRN
Start: 2014-06-04 — End: 2014-06-21

## 2014-06-04 NOTE — ED Notes (Signed)
Rash on L leg that started last night; large red raised area. Pt also with c/o headache that started Monday; pt prone to headaches r/t hx of MS. No further complaints.

## 2014-06-04 NOTE — Discharge Instructions (Signed)
YOU MAY SEE THE DOCTOR LISTED IF YOUR DOCTOR IS NOT AVAILABLE FOR FOLLOW UP IN THE TIME FRAME PROVIDED.  RETURN TO THE EMERGENCY DEPARTMENT IMMEDIATELY FOR ANY WORSENING OR CONCERNS.    USE OVER THE COUNTER BENADRYL AS DIRECTED ON BOTTLE FOR ITCHING        Scabies    YOU MAY HAVE scabies.    Scabies is an infection of the skin caused by very small bugs called mites. Scabies is not dangerous. However, itching caused by the mites can be very annoying.    Scabies can easily pass along to others in the same household. It is often passed on when people come in contact with the mites. These mites can live in clothing, bed sheets and towels, etc.    There are a few scabies infestation symptoms. These include itching (possibly with inflamed skin). A rash may appear in the skin folds or genital area or in the finger web spaces and wrists.    General treatment includes:   Medicine to kill the scabies mite on your skin. Two medicines are often used to treat the mites. One is permethrin (Elimite, Nix). The other is lindane (Kwell). They both do a good job of getting rid of scabies.   Medicines to help with itching caused by the mites. This could be a steroid cream or an antihistamine, like diphenhydramine (Benadryl) or hydroxyzine (Atarax).   The removal (killing) of any more scabies mites that may live in the house. All clothing, sheets, towels, etc. used by the patient or other household contacts within the past 24 hours should be washed in hot water.   All others living in the same house need to get treated. It doesn t matter if they currently have a rash and itching or not.    After treatment, the scabies mite will no longer be in your skin. You may still have itching for 1-2 weeks.    YOU SHOULD SEEK MEDICAL ATTENTION IMMEDIATELY, EITHER HERE OR AT THE NEAREST EMERGENCY DEPARTMENT, IF ANY OF THE FOLLOWING OCCURS:   Constant itching for more than 2 weeks.   Symptoms get significantly worse.   The rash  gets worse and starts to spread or you think it is getting infected. Some signs of infection are: redness, increased pain, swelling or pus-like drainage.       Headache    You have been treated for a headache.    Headaches are very common. Most of the time they are benign (not harmful). Some headaches can be very serious. Your headache appears to be benign. The doctor feels it is OK for you to go home.    If you continue to have headaches, or if this headache does not resolve over the next few days, you should be evaluated by your regular doctor or a neurologist. Keep a "headache diary." This may help your doctor learn the cause of your headaches.    Take your headache medication as directed. This is especially important if your doctor has placed you on a daily medication to prevent headaches.    YOU SHOULD SEEK MEDICAL ATTENTION IMMEDIATELY, EITHER HERE OR AT THE NEAREST EMERGENCY DEPARTMENT, IF ANY OF THE FOLLOWING OCCURS:   Your headache gets worse.   You have a severe headache that occurs suddenly.   Your head pain is different from your normal headache.   You have a fever (temperature higher than 100.54F / 38C), especially with a stiff neck.   You feel numbness, tingling, or  weakness in your arms or legs.   You pass out.   You have problems with your vision.   You vomit and have trouble taking medication or keeping it down.            Elevated Blood Pressure    During your visit today your blood pressure was higher than normal.    Check your blood pressure several times over the next several days, then follow up with your regular doctor. If you do not have a doctor, ask the medical staff to refer you to one.    You may need medication for your blood pressure if it stays high. Untreated high blood pressure can cause damage to your heart and kidneys and may lead to a heart attack or stroke. It is VERY IMPORTANT to follow up with your doctor.   Check your blood pressure daily and follow up with  your doctor.   A doctor will diagnose high blood pressure only if your blood pressure is high for several days. Many pharmacies have machines that let you check your own blood pressure. You can also check with a fire station to see whether a paramedic will take your blood pressure. Another option is to purchase a blood pressure monitor to use at home. These are available at most pharmacies.     YOU SHOULD SEEK MEDICAL ATTENTION IMMEDIATELY, EITHER HERE OR AT THE NEAREST EMERGENCY DEPARTMENT, IF ANY OF THE FOLLOWING OCCURS:   You have a sudden or severe headache.   You are numb, tingly, or weak on one side of your body, half of your face droops, or you have trouble speaking.   You have chest pain.   You are short of breath.

## 2014-06-05 ENCOUNTER — Emergency Department: Payer: BC Managed Care – PPO

## 2014-06-05 ENCOUNTER — Emergency Department
Admission: EM | Admit: 2014-06-05 | Discharge: 2014-06-05 | Disposition: A | Discharge status: Home / Self Care | Payer: BC Managed Care – PPO | Attending: Emergency Medicine | Admitting: Emergency Medicine

## 2014-06-05 DIAGNOSIS — L52 Erythema nodosum: Secondary | ICD-10-CM | POA: Insufficient documentation

## 2014-06-05 DIAGNOSIS — G35 Multiple sclerosis: Secondary | ICD-10-CM | POA: Insufficient documentation

## 2014-06-05 DIAGNOSIS — Z87891 Personal history of nicotine dependence: Secondary | ICD-10-CM | POA: Insufficient documentation

## 2014-06-05 MED ORDER — PREDNISONE 20 MG PO TABS
40.0000 mg | ORAL_TABLET | Freq: Every day | ORAL | Status: AC
Start: 2014-06-05 — End: 2014-06-10

## 2014-06-05 NOTE — ED Provider Notes (Signed)
Physician/Midlevel provider first contact with patient: 06/04/14 2200         History     Chief Complaint   Patient presents with   . Rash   . Headache     HPI   Patient stayed in a hotel a week ago and after that she had a red itchy, burning rash on her right wrist once to the doctor gave her Keflex and told her use of different creams it did not work, and now several other areas of red, itching, burning, have showed up.  She will get that checked out today.  Pt answers that there are no fevers, chills, nausea, vomiting, diarrhea, constipation, trauma.   No chest pain or shortness of breath.  No sore throat        Rash  Constitutional:  Denies fever or chills   Eyes:  Denies change in visual acuity or eye pain  HENT:  Denies sore throat, trouble swallowing   Respiratory:  Denies cough or shortness of breath or wheeze   Cardiovascular:  Denies chest pain or edema   GI:  Denies abdominal pain, nausea, vomiting, bloody stools or diarrhea  GU:  Denies dysuria, hematuria, frequency or trouble urinating   Musculoskeletal:  Denies other joint pain  Integument:  Denies wound  Neurologic:  Denies headache, focal weakness, numbness, tingling, speech, vision or gait changes  Psychiatric:  Denies suicidal or homicidal ideation.    Discreet areas for different areas, erythematous rash with blanching to the areas have small papules in a linear fashion  Constitutional:  Well developed, well nourished, no acute distress, not ill appearing   Eyes:   conjunctiva normal, no discharge or redness  HENT:  Atraumatic, external ears normal, nose normal, oropharynx moist, no pharyngeal exudates. Neck- normal range of motion, no tenderness, supple   Respiratory:  No respiratory distress, normal breath sounds, no rales, no wheezing, no rhonchi  Cardiovascular:  Normal rate, normal rhythm, no murmurs, no gallops, no rubs, normal radial pulses and dpp   GI:  Soft, nondistended, nontender, no organomegaly, no mass, no rebound, no guarding    GU:  No costovertebral angle tenderness   Musculoskeletal:  No edema, no tenderness, no deformities. Back- no tenderness  Integument:  Well hydrated,   Lymphatic:  No prominent cervical LAD  Neurologic:  Alert & oriented x 3, normal motor function, no focal deficits noted, coordination normal   Psychiatric:  Speech and behavior appropriate       Diagnosis management comments: I, Ferdinand Cava, MD, have been the primary provider for this patient during this Emergency Dept visit.    Oxygen saturation by pulse oximetry is 95%-100%, Normal, none needed.    DDx: Very likely scabies.  Not consistent with bacterial infection, doubt ALLERGIC reaction              Patient Progress  Patient progress: stable        Past Medical History   Diagnosis Date   . MS (multiple sclerosis)    . Pneumonia 2011, 2012   . Sepsis 2011       History reviewed. No pertinent past surgical history.    History reviewed. No pertinent family history.    Social  History   Substance Use Topics   . Smoking status: Former Games developer   . Smokeless tobacco: Not on file   . Alcohol Use: Not on file       .     Allergies   Allergen Reactions   .  Penicillins        Home Medications     Last Medication Reconciliation Action:  Complete Visinski, Cicero Duck, RN 06/04/2014  9:44 PM                  acetaminophen (TYLENOL) 325 MG tablet     Take 650 mg by mouth.     ALPRAZolam (XANAX) 1 MG tablet     Take 1 mg by mouth nightly as needed for Sleep.     baclofen (LIORESAL) 10 MG tablet     Take 10 mg by mouth 3 (three) times daily.           Flagged for Removal             oxyCODONE-acetaminophen (PERCOCET) 5-325 MG per tablet     1-2 tablets by mouth every 6 hours as needed for severe pain;  Do not drive or operate machinery while taking this medicine     promethazine (PHENERGAN) 25 MG tablet     Take 1 tablet (25 mg total) by mouth every 6 (six) hours as needed for Nausea.           Review of Systems    Physical Exam    BP: 157/81 mmHg, Heart Rate: 83, Temp:  98.7 F (37.1 C), Resp Rate: 17, SpO2: 100 %, Weight: 79.833 kg    Physical Exam      MDM and ED Course     ED Medication Orders     Start     Status Ordering Provider    06/04/14 2230  HYDROcodone-acetaminophen (NORCO) 5-325 MG per tablet 2 tablet   Once     Route: Oral  Ordered Dose: 2 tablet     Last MAR action:  Given Corley Maffeo JAMES              MDM       Procedures  Results     ** No results found for the last 24 hours. **        Radiology Results (24 Hour)     ** No results found for the last 24 hours. **          Clinical Impression & Disposition     Clinical Impression  Final diagnoses:   Rash   Headache, unspecified headache type   Elevated blood pressure        ED Disposition     Discharge Macel Kottke discharge to home/self care.    Condition at disposition: Stable             Discharge Medication List as of 06/04/2014 10:27 PM      START taking these medications    Details   HYDROcodone-acetaminophen (NORCO) 5-325 MG per tablet Take 2 tablets by mouth every 6 (six) hours as needed., Starting 06/04/2014, Until Discontinued, Print      permethrin (ELIMITE) 5 % cream Apply topically once. FOLLOW DIRECTIONS ON PACKAGING., Starting 06/04/2014, Print                         Ferdinand Cava, MD  06/05/14 [phone]

## 2014-06-05 NOTE — Discharge Instructions (Signed)
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     Erythema Nodosum is a painful rash that usually is on the legs but can be in other places.  The cause of the rash is an inflammation (irritation) of fatty tissue under the skin.  This causes pain, redness, and swelling of the skin. The symptoms often last several weeks. The cause of Erythema Nodosum is often unknown, but it can be linked to certain illnesses, chronic (ongoing) diseases, and medication reactions. Your doctor has decided, based on your exam today, that the cause of the rash is not life-threatening or dangerous.     Your doctor may prescribe medications to treat the pain like anti-inflammatory medicine such as ibuprofen (Advil® or Motrin®), naproxen (Aleve®, Naprosyn®). In some cases you may be given stronger pain medicine.  Take these medications only as directed, since they can be harmful if taken improperly.  Also, if you are given a stronger medication, do not drive or operate heavy machinery while taking it.     Some things you can do to help your pain are: Resting, Icing, Compressing and Elevating the injured area. Remember this as "RICE."     · REST: Limit the use of the injured body part.     · ICE: By applying ice to the affected area, swelling and pain can be reduced. Place some ice cubes in a re-sealable (Ziploc®) bag and add some water. Put a thin washcloth between the bag and the skin. Apply the ice bag to the area for at least 20 minutes. Do this at least 4 times per day. It is okay to do this more often than directed. You can also do it for longer than directed. NEVER APPLY ICE DIRECTLY TO THE SKIN.     · COMPRESS: Compression means to apply pressure around the injured area such as with a splint, cast or an ace bandage. Compression decreases swelling and improves comfort. Compression should be tight enough to relieve swelling but not so tight as to decrease circulation. Increasing pain, numbness, tingling, or change  in skin color, are all signs of decreased circulation.     · ELEVATE: Elevate the injured part. For example, elevate your foot by placing it on a chair while sitting, or propping it up on pillows when lying down.     A dermatologist (skin doctor) often confirms the Erythema Nodosum diagnosis. You may be asked to follow up with your primary care doctor for more tests and may be referred to a skin doctor or other specialist. Since Erythema Nodosum is sometimes linked with another illness, you need further evaluation to find out the possible causes.     YOU SHOULD SEEK MEDICAL ATTENTION IMMEDIATELY, EITHER HERE OR AT THE NEAREST EMERGENCY DEPARTMENT, IF ANY OF THE FOLLOWING OCCUR:     · You have a fever (temperature higher than 100.4°F or 38°C).  · You start to have severe pain in the affected area.  · The affected area becomes pale, numb, and very firm to the touch.  · The skin starts to blister in the affected area.     If you can't follow up with your doctor, or if at any time you feel you need to be rechecked or seen again, come back here or go to the nearest emergency department.

## 2014-06-09 NOTE — ED Provider Notes (Signed)
Physician/Midlevel provider first contact with patient: 06/05/14 1638         History     Chief Complaint   Patient presents with   . Rash     Patient is a 39 y.o. female presenting with rash. The history is provided by the patient.   Rash  Location: L calf, L elbow.  Quality: bruising (L calf area has turned purple since she was seen in ED yesterday), redness and swelling    Severity:  Moderate  Duration:  3 weeks  Timing:  Constant  Progression since onset: migratory.  Chronicity:  New  Context comment:  Pt went to [place] for the weekend, but reports staying in a nice hotel.  She doesn't know of any other new exposures.  No recent illnesses.  Relieved by:  Nothing  Exacerbated by: no obvious exacerbators.  Ineffective treatments: some type of skin cream prescribed last week at an urgent care, and Elimite prescribed from ED yesterday with dx scabies.  Associated symptoms: fatigue, headaches and myalgias    Associated symptoms: no abdominal pain, no fever, no joint pain, no nausea, no shortness of breath, no sore throat, no throat swelling, no tongue swelling, no URI, not vomiting and not wheezing     Pt reports that lesions are itchy and tender to palpation.        Past Medical History   Diagnosis Date   . MS (multiple sclerosis)    . Pneumonia 2011, 2012   . Sepsis 2011       History reviewed. No pertinent past surgical history.    History reviewed. No pertinent family history.    Social  History   Substance Use Topics   . Smoking status: Former Games developer   . Smokeless tobacco: Not on file   . Alcohol Use: Not on file       .     Allergies   Allergen Reactions   . Penicillins        Home Medications     Last Medication Reconciliation Action:  In Progress Benny Lennert, RN 06/05/2014  4:37 PM                  acetaminophen (TYLENOL) 325 MG tablet     Take 650 mg by mouth.     ALPRAZolam (XANAX) 1 MG tablet     Take 1 mg by mouth nightly as needed for Sleep.     baclofen (LIORESAL) 10 MG tablet     Take 10 mg  by mouth 3 (three) times daily.     HYDROcodone-acetaminophen (NORCO) 5-325 MG per tablet     Take 2 tablets by mouth every 6 (six) hours as needed.     oxyCODONE-acetaminophen (PERCOCET) 5-325 MG per tablet     1-2 tablets by mouth every 6 hours as needed for severe pain;  Do not drive or operate machinery while taking this medicine     permethrin (ELIMITE) 5 % cream (Expired)     Apply topically once. FOLLOW DIRECTIONS ON PACKAGING.     promethazine (PHENERGAN) 25 MG tablet     Take 1 tablet (25 mg total) by mouth every 6 (six) hours as needed for Nausea.           Review of Systems   Constitutional: Positive for fatigue. Negative for fever, activity change and appetite change.   HENT: Negative for congestion and sore throat.    Eyes: Negative for redness, itching and visual disturbance.  Respiratory: Negative for shortness of breath and wheezing.    Gastrointestinal: Negative for nausea, vomiting and abdominal pain.   Genitourinary: Negative for decreased urine volume.   Musculoskeletal: Positive for myalgias. Negative for arthralgias.   Skin: Positive for rash.   Neurological: Positive for headaches. Negative for weakness and light-headedness.   Hematological: Does not bruise/bleed easily.   All other systems reviewed and are negative.      Physical Exam    BP: 128/68 mmHg, Heart Rate: 86, Temp: 98.4 F (36.9 C), Resp Rate: 15, SpO2: 100 %, Weight: 79.8 kg    Physical Exam   Constitutional: She is oriented to person, place, and time. Vital signs are normal. She appears well-developed and well-nourished. No distress.   HENT:   Head: Normocephalic and atraumatic.   Right Ear: Tympanic membrane, external ear and ear canal normal.   Left Ear: Tympanic membrane, external ear and ear canal normal.   Mouth/Throat: Uvula is midline, oropharynx is clear and moist and mucous membranes are normal.   No intraoral lesions/ rashes   Eyes: Conjunctivae and EOM are normal. Pupils are equal, round, and reactive to light.      Neck: Normal range of motion. Neck supple. No JVD present.   Cardiovascular: Normal rate, regular rhythm, normal heart sounds and intact distal pulses.    No murmur heard.  Pulmonary/Chest: Effort normal and breath sounds normal. No respiratory distress. She has no wheezes.   Abdominal: Soft. Bowel sounds are normal. There is no tenderness. There is no guarding.   Musculoskeletal: Normal range of motion.   Lymphadenopathy:     She has no cervical adenopathy.   Neurological: She is alert and oriented to person, place, and time. GCS eye subscore is 4. GCS verbal subscore is 5. GCS motor subscore is 6.   Skin: Skin is warm and dry. Rash noted. No pallor.        Psychiatric: She has a normal mood and affect. Her behavior is normal.   Nursing note and vitals reviewed.        MDM and ED Course     ED Medication Orders     None              MDM  Number of Diagnoses or Management Options  Erythema nodosum:     I, Kalman Drape MD, have been the primary provider for Ellajane Altic during this Emergency Dept visit.    Oxygen saturation by pulse oximetry is 95%-100% on RA, Normal.  Interventions: Patient Observed.    Rash appears like erythema nodosum.  Unable to elicit any specific hx that would have been causative.  Will try steroids and pt instructed to f/u w/ Derm.       Procedures    Clinical Impression & Disposition     Clinical Impression  Final diagnoses:   Erythema nodosum        ED Disposition     Discharge Kadija Propp discharge to home/self care.    Condition at disposition: Stable             Discharge Medication List as of 06/05/2014  5:08 PM      START taking these medications    Details   predniSONE (DELTASONE) 20 MG tablet Take 2 tablets (40 mg total) by mouth daily., Starting 06/05/2014, Until Wed 06/10/14, Print                         [place],  Rolly Pancake, MD  06/09/14 2044

## 2014-06-20 ENCOUNTER — Emergency Department
Admission: EM | Admit: 2014-06-20 | Discharge: 2014-06-21 | Disposition: A | Discharge status: Home / Self Care | Payer: BC Managed Care – PPO | Attending: Emergency Medicine | Admitting: Emergency Medicine

## 2014-06-20 ENCOUNTER — Emergency Department: Payer: BC Managed Care – PPO

## 2014-06-20 DIAGNOSIS — R21 Rash and other nonspecific skin eruption: Secondary | ICD-10-CM | POA: Insufficient documentation

## 2014-06-20 DIAGNOSIS — Z87891 Personal history of nicotine dependence: Secondary | ICD-10-CM | POA: Insufficient documentation

## 2014-06-20 DIAGNOSIS — G35 Multiple sclerosis: Secondary | ICD-10-CM | POA: Insufficient documentation

## 2014-06-20 LAB — CBC AND DIFFERENTIAL
Basophils Absolute Automated: 0.03 10*3/uL (ref 0.00–0.20)
Basophils Automated: 1 %
Eosinophils Absolute Automated: 0.22 10*3/uL (ref 0.00–0.70)
Eosinophils Automated: 4 %
Hematocrit: 31.9 % — ABNORMAL LOW (ref 37.0–47.0)
Hgb: 10.2 g/dL — ABNORMAL LOW (ref 12.0–16.0)
Lymphocytes Absolute Automated: 1.87 10*3/uL (ref 0.50–4.40)
Lymphocytes Automated: 38 %
MCH: 27.9 pg — ABNORMAL LOW (ref 28.0–32.0)
MCHC: 32 g/dL (ref 32.0–36.0)
MCV: 87.2 fL (ref 80.0–100.0)
MPV: 9.5 fL (ref 9.4–12.3)
Monocytes Absolute Automated: 0.41 10*3/uL (ref 0.00–1.20)
Monocytes: 8 %
Neutrophils Absolute: 2.41 10*3/uL (ref 1.80–8.10)
Neutrophils: 49 %
Platelets: 282 10*3/uL (ref 140–400)
RBC: 3.66 10*6/uL — ABNORMAL LOW (ref 4.20–5.40)
RDW: 15 % (ref 12–15)
WBC: 4.94 10*3/uL (ref 3.50–10.80)

## 2014-06-20 LAB — BASIC METABOLIC PANEL
Anion Gap: 11 (ref 5.0–15.0)
BUN: 8 mg/dL (ref 7.0–19.0)
CO2: 25 mEq/L (ref 22–29)
Calcium: 8.9 mg/dL (ref 8.5–10.5)
Chloride: 104 mEq/L (ref 100–111)
Creatinine: 0.7 mg/dL (ref 0.6–1.0)
Glucose: 99 mg/dL (ref 70–100)
Potassium: 4.3 mEq/L (ref 3.5–5.1)
Sodium: 140 mEq/L (ref 136–145)

## 2014-06-20 LAB — GFR: EGFR: >60

## 2014-06-20 LAB — SEDIMENTATION RATE: Sed Rate: 43 mm/Hr — ABNORMAL HIGH (ref 0–20)

## 2014-06-20 LAB — C-REACTIVE PROTEIN: C-Reactive Protein: 0.2 mg/dL (ref 0.0–0.8)

## 2014-06-20 MED ORDER — HYDROCODONE-ACETAMINOPHEN 5-325 MG PO TABS
1.0000 | ORAL_TABLET | Freq: Four times a day (QID) | ORAL | Status: DC | PRN
Start: 2014-06-20 — End: 2015-06-26

## 2014-06-20 MED ORDER — HYDROCODONE-ACETAMINOPHEN 5-325 MG PO TABS
2.0000 | ORAL_TABLET | Freq: Once | ORAL | Status: AC
Start: 2014-06-20 — End: 2014-06-20
  Administered 2014-06-20: 2 via ORAL
  Filled 2014-06-20: qty 2

## 2014-06-20 MED ORDER — SULFAMETHOXAZOLE-TRIMETHOPRIM 800-160 MG PO TABS
1.0000 | ORAL_TABLET | Freq: Once | ORAL | Status: AC
Start: 2014-06-21 — End: 2014-06-20
  Administered 2014-06-20: 1 via ORAL
  Filled 2014-06-20: qty 1

## 2014-06-20 MED ORDER — PREDNISONE 20 MG PO TABS
40.0000 mg | ORAL_TABLET | Freq: Once | ORAL | Status: AC
Start: 2014-06-20 — End: 2014-06-20
  Administered 2014-06-20: 40 mg via ORAL
  Filled 2014-06-20: qty 2

## 2014-06-20 MED ORDER — PREDNISONE 20 MG PO TABS
60.0000 mg | ORAL_TABLET | Freq: Every day | ORAL | Status: AC
Start: 2014-06-21 — End: 2014-06-25

## 2014-06-20 MED ORDER — SULFAMETHOXAZOLE-TRIMETHOPRIM 800-160 MG PO TABS
1.0000 | ORAL_TABLET | Freq: Two times a day (BID) | ORAL | Status: DC
Start: 2014-06-20 — End: 2014-06-27

## 2014-06-20 NOTE — Discharge Instructions (Signed)
Rash, Nonspecific    You have been seen today for a rash.    The rash does not have a specific appearance or cause that would let the medical staff give a definite diagnosis or treatment now.    There are many causes for rashes to appear on the skin. The medical staff will have talked about some of the many causes. Some causes may be: Allergic reactions, chemical irritation of the skin or infections. A rash alone with no other symptoms is rarely harmful.    If you have some itching you may try an oral (by mouth) antihistamine like diphenhydramine (Benadryl). This is available over the counter (without prescription). Follow the directions and precautions on the medicine packaging.    Follow up with your family doctor or clinic for reevaluation of the rash if it does not clear up in two or three days.    Sometimes, you will need to see a Dermatologist (a skin specialist doctor) for help finding the cause of the rash.    It is OK for you to go home now.    Even though it may be hard, try not to scratch the affected area. A cool wet cloth can help relieve itching and keep you from scratching.    YOU SHOULD SEEK MEDICAL ATTENTION IMMEDIATELY, EITHER HERE OR AT THE NEAREST EMERGENCY DEPARTMENT, IF ANY OF THE FOLLOWING OCCURS:   Rash spreads or gets worse.   Severe itching, pain or burning in the skin.   Blistering, bruising or bleeding skin.   Fever (temperature higher than 100.2F / 38C), headache, vomiting (throwing up).   Any new symptoms which are of concern to you.   Any signs of infection in the skin develop. This includes more redness, pain, pus drainage, fevers (temperature higher than 100.2F / 38C) or swelling.       You have been prescribed Norco (hydrocodone/acetamenophen).    1. No concurrent Tylenol, acetaminophen, APAP use.  2. You must not drive, operate machinery, preform dangerous activities for at least 6 hours after taking Norco (hydrocodone/acetamenophen).  3.  Norco  (hydrocodone/acetamenophen)has addiction potential. Therefore, only use this medication for the treatment of acute pain.  4.  Norco (hydrocodone/acetamenophen)may cause constipation, itching, dizziness and nausea. Stop this medication if you have any adverse reactions.

## 2014-06-20 NOTE — ED Provider Notes (Signed)
Physician/Midlevel provider first contact with patient: 06/20/14 2243         History     Chief Complaint   Patient presents with   . Rash     HPI Comments: This 39 year old female presents with a recurrent rash to her left leg and left elbow.  She presents from home by POV with a previous diagnosis of erythema nodosum.  Patient states she started noticing symptoms since February 24, and they come and go.  In the last 24 hours, she noticed increased redness to the left anterior leg and mild redness to the left elbow.  She states it feels similar to the previous episodes.  She is not yet seen a dermatologist, as her appointment was canceled by the dermatologist and rescheduled for next month.  She denies any fever, chills, nausea, vomiting, numbness, tingling or weakness.  She states the pain is moderate to severe.  She denies any trauma.    Patient is worse with activity and improves at rest.    The history is provided by the patient.            Past Medical History   Diagnosis Date   . MS (multiple sclerosis)    . Pneumonia 2011, 2012   . Sepsis 2011       History reviewed. No pertinent past surgical history.    History reviewed. No pertinent family history.    Social  History   Substance Use Topics   . Smoking status: Former Games developer   . Smokeless tobacco: Not on file   . Alcohol Use: Not on file       .     Allergies   Allergen Reactions   . Penicillins        Home Medications     Last Medication Reconciliation Action:  In Progress Visinski, Cicero Duck, RN 06/20/2014 10:45 PM                  acetaminophen (TYLENOL) 325 MG tablet     Take 650 mg by mouth.     ALPRAZolam (XANAX) 1 MG tablet     Take 1 mg by mouth nightly as needed for Sleep.     baclofen (LIORESAL) 10 MG tablet     Take 10 mg by mouth 3 (three) times daily.     oxyCODONE-acetaminophen (PERCOCET) 5-325 MG per tablet     1-2 tablets by mouth every 6 hours as needed for severe pain;  Do not drive or operate machinery while taking this medicine      promethazine (PHENERGAN) 25 MG tablet     Take 1 tablet (25 mg total) by mouth every 6 (six) hours as needed for Nausea.                     Review of Systems   Constitutional: Negative for fever and diaphoresis.   HENT: Negative for congestion, ear pain and sore throat.    Eyes: Negative for pain and visual disturbance.   Respiratory: Negative for cough, chest tightness, shortness of breath and wheezing.    Cardiovascular: Negative for chest pain, palpitations and leg swelling.   Gastrointestinal: Negative for nausea, vomiting, abdominal pain, diarrhea and constipation.   Genitourinary: Negative for dysuria and urgency.   Musculoskeletal: Negative for back pain, gait problem and neck pain.   Skin: Positive for rash. Negative for color change.   Neurological: Negative for dizziness, weakness and headaches.   Psychiatric/Behavioral: Negative for confusion. The patient  is not nervous/anxious.        Physical Exam    BP: 130/77 mmHg, Heart Rate: 75, Temp: 98 F (36.7 C), Resp Rate: 16, SpO2: 100 %, Weight: 79.833 kg    Physical Exam   Constitutional: She is oriented to person, place, and time. She appears well-developed and well-nourished.   HENT:   Head: Normocephalic and atraumatic.   Right Ear: External ear normal.   Left Ear: External ear normal.   Mouth/Throat: Oropharynx is clear and moist. No oropharyngeal exudate.   Eyes: Conjunctivae are normal. Pupils are equal, round, and reactive to light. Right eye exhibits no discharge. Left eye exhibits no discharge. No scleral icterus.   Neck: Normal range of motion. Neck supple. No JVD present. No tracheal deviation present.   Cardiovascular: Normal rate, regular rhythm and normal heart sounds.    No murmur heard.  Pulmonary/Chest: Effort normal and breath sounds normal. She has no wheezes. She exhibits no tenderness.   Abdominal: Soft. Bowel sounds are normal. She exhibits no distension. There is no tenderness. There is no guarding.   Musculoskeletal: She exhibits no  edema or tenderness.   Neurological: She is alert and oriented to person, place, and time. Coordination normal.   Skin: Skin is warm and dry. Rash noted. She is not diaphoretic. No pallor.        Psychiatric: She has a normal mood and affect. Her behavior is normal. Judgment normal.   Nursing note and vitals reviewed.        MDM and ED Course     ED Medication Orders     Start Ordered     Status Ordering Provider    06/21/14 0000 06/20/14 2347  sulfamethoxazole-trimethoprim (BACTRIM DS,SEPTRA DS) 800-160 MG per tablet 1 tablet   Once     Route: Oral  Ordered Dose: 1 tablet     Last MAR action:  Given Zelie Asbill    06/20/14 2300 06/20/14 2251  predniSONE (DELTASONE) tablet 40 mg   Once     Route: Oral  Ordered Dose: 40 mg     Last MAR action:  Given Harvest Deist    06/20/14 2300 06/20/14 2252  HYDROcodone-acetaminophen (NORCO) 5-325 MG per tablet 2 tablet   Once     Route: Oral  Ordered Dose: 2 tablet     Last MAR action:  Given Aria Jarrard              MDM  Number of Diagnoses or Management Options  Rash:   Diagnosis management comments: Diagnosis management comments: Oxygen saturation by pulse oximetry is 95%-100%, Normal.  Interventions: None Needed.    Differential diagnosis: Erythema nodosum, cellulitis, or other rash    Reexamination: Vital signs are stable.  Patient in no acute distress.  Sedimentation rate elevated at 43, but CRP was normal.    Patient is alert and oriented 3.  On reexamination, vital signs were stable, and patient is in no acute distress.  At this point she is stable for discharge.  Samora Kropf had an opportunity to ask questions and she verbalized understanding of instructions.      I have reviewed the nursing history.    DR. Melvern Sample  is the primary attending for this patient and has obtained and performed the history, PE, and medical decision making for this patient.      Results     Procedure Component Value Units Date/Time    Basic Metabolic Panel ([id number]) Collected:  06/20/14  2300  Specimen Information:  Blood Updated:  06/20/14 2337     Glucose 99 mg/dL      BUN 8.0 mg/dL      Creatinine 0.7 mg/dL      CALCIUM 8.9 mg/dL      Sodium 161 mEq/L      Potassium 4.3 mEq/L      Chloride 104 mEq/L      CO2 25 mEq/L      Anion Gap 11.0     C Reactive Protein ([id number]) Collected:  06/20/14 2300    Specimen Information:  Blood Updated:  06/20/14 2337     C-Reactive Protein 0.2 mg/dL     GFR ([id number]) Collected:  06/20/14 2300     EGFR >60.0 Updated:  06/20/14 2337    Sedimentation rate (ESR) ([id number])  (Abnormal) Collected:  06/20/14   2300    Specimen Information:  Blood Updated:  06/20/14 2331     Sed Rate 43 (H) mm/Hr     CBC with differential ([id number])  (Abnormal) Collected:  06/20/14 2300    Specimen Information:  Blood / Blood Updated:  06/20/14 2321     WBC 4.94 x10 3/uL      Hgb 10.2 (L) g/dL      Hematocrit 52.8 (L) %      Platelets 282 x10 3/uL      RBC 3.66 (L) x10 6/uL      MCV 87.2 fL      MCH 27.9 (L) pg      MCHC 32.0 g/dL      RDW 15 %      MPV 9.5 fL      Neutrophils 49 %      Lymphocytes Automated 38 %      Monocytes 8 %      Eosinophils Automated 4 %      Basophils Automated 1 %      Immature Granulocyte Unmeasured %      Nucleated RBC Unmeasured /100 WBC      Neutrophils Absolute 2.41 x10 3/uL      Abs Lymph Automated 1.87 x10 3/uL      Abs Mono Automated 0.41 x10 3/uL      Abs Eos Automated 0.22 x10 3/uL      Absolute Baso Automated 0.03 x10 3/uL      Absolute Immature Granulocyte Unmeasured x10 3/uL         Radiology Results (24 Hour)     ** No results found for the last 24 hours. **               Amount and/or Complexity of Data Reviewed  Clinical lab tests: ordered and reviewed  Tests in the medicine section of CPT: ordered and reviewed           Procedures    Clinical Impression & Disposition     Clinical Impression  Final diagnoses:   Rash        ED Disposition     Discharge Dayana Suppes discharge to home/self care.    Condition at disposition: Stable              Discharge Medication List as of 06/20/2014 11:48 PM      START taking these medications    Details   predniSONE (DELTASONE) 20 MG tablet Take 3 tablets (60 mg total) by mouth daily., Starting 06/21/2014, Until Thu 06/25/14, Print      sulfamethoxazole-trimethoprim (BACTRIM DS) 800-160 MG per tablet Take 1 tablet by mouth 2 (  two) times daily., Starting 06/20/2014, Until Tue 06/30/14, Print                         [place], [place], DO  06/21/14 [phone]

## 2014-06-20 NOTE — ED Notes (Signed)
Pt seen at [facility] 3/11 and was told she has erythema nordosum. Pt now presents to ER with red swollen raised patches on legs. Pt was suppose to f/u with dermatology, but was unable to get appt until 3/31.

## 2014-06-27 ENCOUNTER — Emergency Department: Payer: BC Managed Care – PPO

## 2014-06-27 ENCOUNTER — Emergency Department
Admission: EM | Admit: 2014-06-27 | Discharge: 2014-06-27 | Disposition: A | Discharge status: Home / Self Care | Payer: BC Managed Care – PPO | Attending: Emergency Medicine | Admitting: Emergency Medicine

## 2014-06-27 DIAGNOSIS — Z87891 Personal history of nicotine dependence: Secondary | ICD-10-CM | POA: Insufficient documentation

## 2014-06-27 DIAGNOSIS — G8929 Other chronic pain: Secondary | ICD-10-CM

## 2014-06-27 DIAGNOSIS — G35 Multiple sclerosis: Secondary | ICD-10-CM | POA: Insufficient documentation

## 2014-06-27 MED ORDER — PROMETHAZINE HCL 25 MG PO TABS
25.0000 mg | ORAL_TABLET | Freq: Once | ORAL | Status: AC
Start: 2014-06-27 — End: 2014-06-27
  Administered 2014-06-27: 25 mg via ORAL
  Filled 2014-06-27: qty 1

## 2014-06-27 MED ORDER — HYDROMORPHONE HCL 2 MG PO TABS
2.0000 mg | ORAL_TABLET | Freq: Four times a day (QID) | ORAL | Status: DC | PRN
Start: 2014-06-27 — End: 2015-06-26

## 2014-06-27 MED ORDER — PROMETHAZINE HCL 25 MG PO TABS
25.0000 mg | ORAL_TABLET | Freq: Four times a day (QID) | ORAL | Status: DC | PRN
Start: 2014-06-27 — End: 2015-06-26

## 2014-06-27 MED ORDER — HYDROMORPHONE HCL 1 MG/ML IJ SOLN
1.0000 mg | Freq: Once | INTRAMUSCULAR | Status: AC
Start: 2014-06-27 — End: 2014-06-27
  Administered 2014-06-27: 1 mg via INTRAMUSCULAR
  Filled 2014-06-27: qty 1

## 2014-06-27 NOTE — Discharge Instructions (Signed)
Chronic Pain Exacerbation     You have been seen for your chronic pain problem.     The Emergency Department/Urgent Care is available for emergencies related to your painful problem. However, you need to see your regular doctor for ongoing care for your pain.     ALL pain medicine refills MUST be done by your primary care doctor or the pain specialist who takes care of your pain.     The Emergency Department/Urgent Care is not the right place for the ongoing care of chronic pain.     If you have not seen a pain specialist, ask the doctor for information. He or she can give you information on who to contact and help refer you to one.     If you feel you are not able to get proper pain treatment, contact your health plan for advice.     YOU MUST PLAN IN ADVANCE! If you are running low on pain medicine, plan in advance and get a refill from your doctor. This is your responsibility. NEVER wait until a weekend to try to reach your doctor if you are running out of pain medicine. Emergency /Urgent Care Physicians usually only give a single dose of pain medicine. This means you may not have enough for the rest of the weekend.     THIS STATEMENT IS PROVIDED FOR YOUR PROTECTION AND FOR YOUR INFORMATION ONLY AND IS NOT MEANT TO IMPLY ANY ILLEGAL ACTIVITY ON YOUR PART: It is a serious crime to lie to a doctor to get narcotic pain medicine. This includes but is not limited to: Getting multiple prescriptions for pain medicine by seeing more than one doctor, giving false information to get a prescription and lying about your medical condition. This includes the kind of condition and how serious it is.  · Some common narcotic pain medicines: oxycodone/acetaminophen (Percocet®, Roxicet®), hydrocodone/acetaminophen (Vicodin®, Lortab®, Norco®), oxycodone-long acting (OxyContin®), meperidine (Demerol®), hydromorphone (Dilaudid®), morphine (MSIR®, MS Contin®, Kadian®, Avinza®, Oramorph®, Roxanol®, Astramorph®, Duramorph®, others),  codeine/acetaminophen (T3®, T4®), acetaminophen/propoxyphene (Darvocet®, Wygesic®), propoxyphene (Darvon®).     YOU SHOULD SEEK MEDICAL ATTENTION IMMEDIATELY, EITHER HERE OR AT THE NEAREST EMERGENCY DEPARTMENT, IF ANY OF THE FOLLOWING OCCURS:  · Any major change in your pain pattern.  · Symptoms become worse.  · You have any other concerns.     If you can't follow up with your doctor, or if at any time you feel you need to be rechecked or seen again, come back here or go to the nearest emergency department.

## 2014-06-27 NOTE — ED Provider Notes (Signed)
Physician/Midlevel provider first contact with patient: 06/27/14 0940         History     Chief Complaint   Patient presents with   . Generalized Body Aches     HPI Comments:     Chief Complaint: Pain  Onset/Duration: Several days  Quality/Location: "All of her body"  Severity: Moderate  Aggravating Factors: None  Alleviating Factors: None  Associated Symptoms/ Additional Comments: History of multiple sclerosis    Patient relates history of MS/several month history of as yet undiagnosed skin bumps, complaining of allover body pain.  Describes aching pain from head to toe.  Relates skin bumps, that originally they thought were insect bites, that began as bumps and hurt and now has pain all over her body, but the bumps have resolved.  Denies fever/trauma/shortness of breath/cough/abdominal pain/nausea/vomiting/diarrhea/vaginal discharge/recent travel.  Has seen her primary care doctor, Dr. Frederik Schmidt, this week and has seen a rheumatologist, Dr. Sharolyn Douglas, and is supposed to have a skin biopsy and has an upcoming appointment with dermatology.  LMP - 06/20/14    The history is provided by the patient and a friend.            Past Medical History   Diagnosis Date   . MS (multiple sclerosis)    . Pneumonia 2011, 2012   . Sepsis 2011       History reviewed. No pertinent past surgical history.    No family history on file.    Social  History   Substance Use Topics   . Smoking status: Former Games developer   . Smokeless tobacco: Not on file   . Alcohol Use: Not on file       .     Allergies   Allergen Reactions   . Penicillins        Home Medications     Last Medication Reconciliation Action:  In Progress Benny Lennert, RN 06/27/2014  9:46 AM                  acetaminophen (TYLENOL) 325 MG tablet     Take 650 mg by mouth.     ALPRAZolam (XANAX) 1 MG tablet     Take 1 mg by mouth nightly as needed for Sleep.     baclofen (LIORESAL) 10 MG tablet     Take 10 mg by mouth 3 (three) times daily.     HYDROcodone-acetaminophen (NORCO) 5-325 MG  per tablet     Take 1-2 tablets by mouth every 6 (six) hours as needed.     predniSONE (DELTASONE) 20 MG tablet                                              Review of Systems   Constitutional: Negative for fever and fatigue.   HENT: Negative for congestion and sore throat.    Eyes: Negative for redness and visual disturbance.   Respiratory: Negative for cough and shortness of breath.    Cardiovascular: Negative for chest pain and palpitations.   Gastrointestinal: Negative for nausea, vomiting, abdominal pain and diarrhea.   Genitourinary: Negative for dysuria and difficulty urinating.   Musculoskeletal: Positive for myalgias. Negative for back pain.   Skin: Negative for rash.   Neurological: Positive for headaches. Negative for dizziness and weakness.        Symptoms have exacerbated her typical migraine type symptoms.  Psychiatric/Behavioral: Negative for suicidal ideas. The patient is not nervous/anxious.        Physical Exam    BP: 131/87 mmHg, Heart Rate: 83, Temp: 97.9 F (36.6 C), Resp Rate: 16, SpO2: 100 %, Weight: 81.647 kg    Physical Exam   Constitutional: She is oriented to person, place, and time. She appears well-developed. No distress.   Well appearing, in no apparent distress   HENT:   Head: Normocephalic and atraumatic.   Mouth/Throat: Oropharynx is clear and moist. No oropharyngeal exudate.   Eyes: Conjunctivae and EOM are normal. Pupils are equal, round, and reactive to light. No scleral icterus.   Neck: Normal range of motion. Neck supple.   Cardiovascular: Normal rate and regular rhythm.    No murmur heard.  Pulmonary/Chest: Effort normal and breath sounds normal. No respiratory distress.   Abdominal: Soft. She exhibits no mass. There is no tenderness.   Musculoskeletal: Normal range of motion. She exhibits no edema.        Right shoulder: She exhibits no deformity.   Lymphadenopathy:     She has no cervical adenopathy.   Neurological: She is alert and oriented to person, place, and time. She  has normal strength. No cranial nerve deficit or sensory deficit. Coordination and gait normal. GCS eye subscore is 4. GCS verbal subscore is 5. GCS motor subscore is 6.   Skin: Skin is warm. No rash noted.   Psychiatric: She has a normal mood and affect. Her speech is normal.         MDM and ED Course     ED Medication Orders     Start Ordered     Status Ordering Provider    06/27/14 1000 06/27/14 0959  HYDROmorphone (DILAUDID) injection 1 mg   Once     Route: Intramuscular  Ordered Dose: 1 mg     Last MAR action:  Given Kimmi Acocella JEFFREY    06/27/14 1000 06/27/14 0959  promethazine (PHENERGAN) tablet 25 mg   Once     Route: Oral  Ordered Dose: 25 mg     Last MAR action:  Given Illyria Sobocinski JEFFREY              MDM  Number of Diagnoses or Management Options  Other chronic pain:   Diagnosis management comments: Patient stable for discharge.  Has had recent labs, and relates more were done this week at her PCPs office, and repeat labs not indicated.    Risk of Complications, Morbidity, and/or Mortality  Presenting problems: moderate  Diagnostic procedures: moderate  Management options: moderate    Patient Progress  Patient progress: stable         Procedures    Clinical Impression & Disposition     Clinical Impression  Final diagnoses:   Other chronic pain        ED Disposition     Discharge Krisna M Staggs discharge to home/self care.    Condition at disposition: Stable             Discharge Medication List as of 06/27/2014 10:01 AM      START taking these medications    Details   HYDROmorphone (DILAUDID) 2 MG tablet Take 1 tablet (2 mg total) by mouth every 6 (six) hours as needed for Pain (As needed for pain)., Starting 06/27/2014, Until Discontinued, Print      promethazine (PHENERGAN) 25 MG tablet Take 1 tablet (25 mg total) by mouth every 6 (six) hours as  needed for Nausea., Starting 06/27/2014, Until Discontinued, Print                         Herma Ard, MD  06/27/14 1053

## 2014-08-04 ENCOUNTER — Encounter (INDEPENDENT_AMBULATORY_CARE_PROVIDER_SITE_OTHER): Payer: BC Managed Care – PPO | Admitting: Dermatology

## 2014-10-26 ENCOUNTER — Encounter (INDEPENDENT_AMBULATORY_CARE_PROVIDER_SITE_OTHER): Payer: Self-pay | Admitting: Family Medicine

## 2014-10-26 ENCOUNTER — Ambulatory Visit (INDEPENDENT_AMBULATORY_CARE_PROVIDER_SITE_OTHER): Payer: BC Managed Care – PPO | Admitting: Family Medicine

## 2014-10-26 VITALS — BP 129/83 | HR 92 | Temp 98.5°F | Resp 16 | Ht 66.0 in | Wt 181.0 lb

## 2014-10-26 DIAGNOSIS — F419 Anxiety disorder, unspecified: Secondary | ICD-10-CM

## 2014-10-26 MED ORDER — ALPRAZOLAM 1 MG PO TABS
1.0000 mg | ORAL_TABLET | Freq: Every day | ORAL | Status: DC
Start: 2014-10-26 — End: 2015-06-26

## 2014-10-26 NOTE — Progress Notes (Signed)
Judy Freeman     female     Judy Freeman 08, 1977     Chief Complaint   Patient presents with   . Anxiety     request refill       Anxiety  Presents for follow-up visit. Episode onset: years. Progression since onset: better with medication. Symptoms include nervous/anxious behavior. Patient reports no chest pain, dizziness, insomnia, palpitations, shortness of breath or suicidal ideas. Primary symptoms comment: with panic. Symptoms occur most days. The severity of symptoms is severe. The symptoms are aggravated by family issues.            Current Outpatient Prescriptions   Medication Sig Dispense Refill   . acetaminophen (TYLENOL) 325 MG tablet Take 650 mg by mouth.     . ALPRAZolam (XANAX) 1 MG tablet Take 1 tablet (1 mg total) by mouth daily. 30 tablet 1   . baclofen (LIORESAL) 10 MG tablet Take 10 mg by mouth 3 (three) times daily.     . betamethasone, augmented, (DIPROLENE) 0.05 % cream APPLY TO URTICARIA BID AS NEEDED EXTERNALLY  2   . carisoprodol (SOMA) 350 MG tablet TK 1 T PO  BID  3   . clonazePAM (KLONOPIN) 0.25 MG disintegrating tablet DIS ONE T PO  BID  5   . DULERA 200-5 MCG/ACT Aerosol INL 2 PFS PO BID  3   . DULoxetine (CYMBALTA) 30 MG capsule TK ONE C PO  QAM  11   . famotidine (PEPCID) 40 MG tablet TK 1 T PO QD HS  3   . fluticasone (FLONASE) 50 MCG/ACT nasal spray USE 2 SPRAYS NASALLY ONCE D  3   . HYDROcodone-acetaminophen (NORCO) 5-325 MG per tablet Take 1-2 tablets by mouth every 6 (six) hours as needed. 20 tablet 0   . HYDROmorphone (DILAUDID) 2 MG tablet Take 1 tablet (2 mg total) by mouth every 6 (six) hours as needed for Pain (As needed for pain). 20 tablet 0   . levocetirizine (XYZAL) 5 MG tablet TK 1 T PO QD  3   . lidocaine (LIDODERM) 5 % APPLY 2-3 PATCHES 12 HOURS ON AND 12 HOURS OFF  3   . meloxicam (MOBIC) 7.5 MG tablet   0   . PATADAY 0.2 % Solution   3   . predniSONE (DELTASONE) 20 MG tablet      . PROAIR HFA 108 (90 BASE) MCG/ACT inhaler USE 2 PUFFS PO Q 4 H FOR 30 DAYS.  3   .  promethazine (PHENERGAN) 25 MG tablet Take 1 tablet (25 mg total) by mouth every 6 (six) hours as needed for Nausea. 15 tablet 0   . Spacer/Aero-Holding Chambers (OPTICHAMBER DIAMOND-LG MASK) Device U UTD  3   . tolterodine (DETROL) 2 MG tablet TK 1 T PO  ONE TO TWO TIMES A DAY FOR OVERACTIVE BLADDER.  11   . topiramate (TOPAMAX) 50 MG tablet TK 1 T PO  BID  3     No current facility-administered medications for this visit.         Past Medical History   Diagnosis Date   . MS (multiple sclerosis)    . Pneumonia 2011, 2012   . Sepsis 2011       History reviewed. No pertinent past surgical history.    Family History   Problem Relation Age of Onset   . Cancer Father    . Diabetes Maternal Aunt    . Cancer Maternal Aunt  reports that she has never smoked. She has never used smokeless tobacco. She reports that she drinks alcohol. She reports that she does not use illicit drugs.    Allergies   Allergen Reactions   . Penicillins        The following portions of the patient's history were reviewed and updated as appropriate: allergies, current medications, past family history, past medical history, past social history, past surgical history and problem list.    Filed Vitals:    10/26/14 1703   BP: 129/83   Pulse: 92   Temp: 98.5 F (36.9 C)   TempSrc: Oral   Resp: 16   Height: 1.676 m (5\' 6" )   Weight: 82.101 kg (181 lb)   SpO2: 95%        REVIEW OF SYSTMEMS    Review of Systems   Constitutional: Negative for fever, chills and malaise/fatigue.   HENT: Negative for congestion.    Eyes: Negative for pain and discharge.   Respiratory: Negative for cough, sputum production and shortness of breath.    Cardiovascular: Negative for chest pain and palpitations.   Gastrointestinal: Negative for vomiting and abdominal pain.   Genitourinary: Negative for dysuria and urgency.   Musculoskeletal: Negative for myalgias and neck pain.   Skin: Negative for itching and rash.   Neurological: Negative for dizziness, loss of  consciousness and headaches.   Psychiatric/Behavioral: Negative for depression, suicidal ideas, hallucinations, memory loss and substance abuse. The patient is nervous/anxious. The patient does not have insomnia.         Physical Exam     Physical Exam   Constitutional: She is oriented to person, place, and time and well-developed, well-nourished, and in no distress.   HENT:   Head: Normocephalic and atraumatic.   Eyes: Conjunctivae are normal. Pupils are equal, round, and reactive to light.   Neck: Normal range of motion. Neck supple.   Cardiovascular: Normal rate, regular rhythm and normal heart sounds.    Pulmonary/Chest: Effort normal and breath sounds normal.   Abdominal: Soft. Bowel sounds are normal.   Musculoskeletal: Normal range of motion. She exhibits no edema.   Neurological: She is alert and oriented to person, place, and time. Coordination normal.   Skin: Skin is warm.   Psychiatric: Affect normal. Her mood appears anxious. Her affect is not blunt, not labile and not inappropriate. She is not agitated. She does not express impulsivity. She does not exhibit a depressed mood. She expresses no homicidal and no suicidal ideation. She expresses no suicidal plans and no homicidal plans. She is not apathetic and not concerned with wish fulfillment. She exhibits ordered thought content, normal new learning ability, normal recent memory and normal remote memory. She does not have a flat affect.            Encounter Diagnosis   Name Primary?   Judy Freeman Anxiety Yes          Adriane was seen today for anxiety.    Diagnoses and all orders for this visit:    Anxiety  Orders:  -     Ambulatory referral to Psychiatry    Other orders  -     ALPRAZolam (XANAX) 1 MG tablet; Take 1 tablet (1 mg total) by mouth daily.          PLAN    Continue generous hydration, take Tylenol or Motrin or Aleve with food for pain/fever/discomfort if there is no contraindication specified by physician. It is important to monitor symptoms  and make  sure that they are not getting worst or rapidly deteriorating. If you dont feel any relief within the timeline specified by physician then call us back or make an appointment for follow up. If the clinic is closed, do not hesitate to go to the E.R. For further evaluation. It is important to come back for follow up if required by physician. I also explain the side effects of medications or treatments prescribed and the potential interactions to patient.       1. ANXIETY STABLE ON ALPRAZOLAM  [place] PMP SHOWED 60 TABS OF KLONOPIN GIVEN BY NEUROLOGY FOR MUSCLE SPASM    REFILL ALPRAZOLAM 1 MG TAKEN DAILY PRN WITH 1 REFILL    REFERRED TO PSYCHIATRY FOR FURTHER EVALUATION AND MONITORING, PT WILL ARRANGE, SCRIPT GIVEN         Return if symptoms worsen or fail to improve.                                              [facility] Burley Saver, M.D.   [employer]

## 2014-12-18 ENCOUNTER — Emergency Department: Payer: BC Managed Care – PPO

## 2014-12-18 ENCOUNTER — Emergency Department
Admission: EM | Admit: 2014-12-18 | Discharge: 2014-12-18 | Disposition: A | Discharge status: Home / Self Care | Payer: BC Managed Care – PPO | Attending: Emergency Medicine | Admitting: Emergency Medicine

## 2014-12-18 DIAGNOSIS — Z7952 Long term (current) use of systemic steroids: Secondary | ICD-10-CM | POA: Insufficient documentation

## 2014-12-18 DIAGNOSIS — R030 Elevated blood-pressure reading, without diagnosis of hypertension: Secondary | ICD-10-CM | POA: Insufficient documentation

## 2014-12-18 DIAGNOSIS — G35 Multiple sclerosis: Secondary | ICD-10-CM | POA: Insufficient documentation

## 2014-12-18 DIAGNOSIS — IMO0001 Reserved for inherently not codable concepts without codable children: Secondary | ICD-10-CM

## 2014-12-18 DIAGNOSIS — J45909 Unspecified asthma, uncomplicated: Secondary | ICD-10-CM | POA: Insufficient documentation

## 2014-12-18 DIAGNOSIS — R52 Pain, unspecified: Secondary | ICD-10-CM | POA: Insufficient documentation

## 2014-12-18 HISTORY — DX: Unspecified asthma, uncomplicated: J45.909

## 2014-12-18 MED ORDER — HYDROMORPHONE HCL 1 MG/ML IJ SOLN
1.0000 mg | Freq: Once | INTRAMUSCULAR | Status: AC
Start: 2014-12-18 — End: 2014-12-18
  Administered 2014-12-18: 1 mg via INTRAMUSCULAR
  Filled 2014-12-18: qty 1

## 2014-12-18 MED ORDER — OXYCODONE-ACETAMINOPHEN 5-325 MG PO TABS
1.0000 | ORAL_TABLET | Freq: Once | ORAL | Status: AC
Start: 2014-12-18 — End: 2014-12-18
  Administered 2014-12-18: 1 via ORAL
  Filled 2014-12-18: qty 1

## 2014-12-18 MED ORDER — PROMETHAZINE HCL 25 MG PO TABS
25.0000 mg | ORAL_TABLET | Freq: Once | ORAL | Status: AC
Start: 2014-12-18 — End: 2014-12-18
  Administered 2014-12-18: 25 mg via ORAL
  Filled 2014-12-18: qty 1

## 2014-12-18 MED ORDER — OXYCODONE-ACETAMINOPHEN 5-325 MG PO TABS
ORAL_TABLET | ORAL | Status: AC
Start: 2014-12-18 — End: 2014-12-28

## 2014-12-18 NOTE — ED Provider Notes (Signed)
Physician/Midlevel provider first contact with patient: 12/18/14 0506         History     Chief Complaint   Patient presents with   . Generalized Body Aches     Patient is a 39 y.o. female presenting with general illness. The history is provided by the patient and a relative.   Illness  Associated symptoms: myalgias    Associated symptoms: no abdominal pain, no chest pain, no congestion, no cough, no fever, no headaches, no nausea, no rhinorrhea, no shortness of breath and no vomiting     39 year female with hx of MS and chronic body pain, reports has pain all the time and has been worse for last 2 days. Reports has symptoms around her menses are worse. Seen by MS doctor today and asked for pain management and put on lidocaine patches. Usually able to function with her pain  No trauma, no fever, no uri symptoms, no vomiting or diarrhea. Denies pregnancy. Seen today and labs reviewed, mild anemia hg 11.4, normal cbc otherwise and normal chemistries.     PCP Unsure   MS in [facility]   Neu[place]   Past Medical History   Diagnosis Date   . MS (multiple sclerosis)    . Pneumonia 2011, 2012   . Sepsis 2011   . Asthma    LMP 11/2014     Past Surgical History   Procedure Laterality Date   . Cesarean section     . Cholecystectomy     btl     Family History   Problem Relation Age of Onset   . Cancer Father    . Diabetes Maternal Aunt    . Cancer Maternal Aunt        Social  Social History   Substance Use Topics   . Smoking status: Never Smoker    . Smokeless tobacco: Never Used   . Alcohol Use: Yes      Comment: socially   .     Allergies   Allergen Reactions   . Penicillins        Home Medications     Last Medication Reconciliation Action:  Complete Inderbitzin de Gerda Diss, RN 12/18/2014  5:14 AM                  acetaminophen (TYLENOL) 325 MG tablet     Take 650 mg by mouth.     ALPRAZolam (XANAX) 1 MG tablet     Take 1 tablet (1 mg total) by mouth daily.     baclofen (LIORESAL) 10 MG tablet     Take 10  mg by mouth 3 (three) times daily.     carisoprodol (SOMA) 350 MG tablet     TK 1 T PO  BID     DULERA 200-5 MCG/ACT Aerosol     INL 2 PFS PO BID     DULoxetine (CYMBALTA) 30 MG capsule     TK ONE C PO  QAM     famotidine (PEPCID) 40 MG tablet     TK 1 T PO QD HS     fluticasone (FLONASE) 50 MCG/ACT nasal spray     USE 2 SPRAYS NASALLY ONCE D     levocetirizine (XYZAL) 5 MG tablet     TK 1 T PO QD     lidocaine (LIDODERM) 5 %     APPLY 2-3 PATCHES 12 HOURS ON AND 12 HOURS OFF     PATADAY 0.2 %  Solution          PROAIR HFA 108 (90 BASE) MCG/ACT inhaler     USE 2 PUFFS PO Q 4 H FOR 30 DAYS.     promethazine (PHENERGAN) 25 MG tablet     Take 1 tablet (25 mg total) by mouth every 6 (six) hours as needed for Nausea.     Spacer/Aero-Holding Chambers (OPTICHAMBER DIAMOND-LG MASK) Device     U UTD     tolterodine (DETROL) 2 MG tablet     TK 1 T PO  ONE TO TWO TIMES A DAY FOR OVERACTIVE BLADDER.     topiramate (TOPAMAX) 50 MG tablet     TK 1 T PO  BID           Flagged for Removal             betamethasone, augmented, (DIPROLENE) 0.05 % cream     APPLY TO URTICARIA BID AS NEEDED EXTERNALLY     clonazePAM (KLONOPIN) 0.25 MG disintegrating tablet     DIS ONE T PO  BID     HYDROcodone-acetaminophen (NORCO) 5-325 MG per tablet     Take 1-2 tablets by mouth every 6 (six) hours as needed.     HYDROmorphone (DILAUDID) 2 MG tablet     Take 1 tablet (2 mg total) by mouth every 6 (six) hours as needed for Pain (As needed for pain).     meloxicam (MOBIC) 7.5 MG tablet          predniSONE (DELTASONE) 20 MG tablet                Review of Systems   Constitutional: Negative for fever and chills.   HENT: Negative for congestion and rhinorrhea.    Respiratory: Negative for cough and shortness of breath.    Cardiovascular: Negative for chest pain.   Gastrointestinal: Negative for nausea, vomiting and abdominal pain.   Genitourinary: Negative for dysuria.   Musculoskeletal: Positive for myalgias, back pain and neck pain. Negative for gait  problem.   Neurological: Negative for weakness, numbness and headaches.   All other systems reviewed and are negative.      Physical Exam    BP: 153/87 mmHg, Heart Rate: 77, Temp: 97.3 F (36.3 C), Resp Rate: 16, SpO2: 100 %, Weight: 82.555 kg    Physical Exam   Constitutional: She is oriented to person, place, and time. She appears well-developed and well-nourished. No distress.   HENT:   Head: Normocephalic and atraumatic.   Nose: Nose normal.   Mouth/Throat: Oropharynx is clear and moist. No oropharyngeal exudate.   Eyes: EOM are normal. Pupils are equal, round, and reactive to light.   Neck: Normal range of motion. Neck supple.   Cardiovascular: Normal rate, regular rhythm and normal heart sounds.    No murmur heard.  Pulmonary/Chest: Effort normal and breath sounds normal. No respiratory distress. She has no wheezes. She has no rales.   Abdominal: Soft. Bowel sounds are normal. She exhibits no distension. There is no tenderness.   Musculoskeletal: Normal range of motion. She exhibits no edema or tenderness.   Diffuse muscular tenderness over back and neck   No bony tenderness or signs of trauma    Lymphadenopathy:     She has no cervical adenopathy.   Neurological: She is alert and oriented to person, place, and time. No cranial nerve deficit. She exhibits normal muscle tone. Coordination and gait normal.   Skin: Skin is warm and dry. She  is not diaphoretic. No erythema.   Psychiatric: She has a normal mood and affect. Her behavior is normal.   Nursing note and vitals reviewed.        MDM and ED Course     ED Medication Orders     Start Ordered     Status Ordering Provider    12/18/14 [phone] 12/18/14 0531  HYDROmorphone (DILAUDID) injection 1 mg   Once     Route: Intramuscular  Ordered Dose: 1 mg     Last MAR action:  Given Reginia Forts    12/18/14 0545 12/18/14 0531  promethazine (PHENERGAN) tablet 25 mg   Once     Route: Oral  Ordered Dose: 25 mg     Last MAR action:  Given Reginia Forts    12/18/14 0545  12/18/14 0531  oxyCODONE-acetaminophen (PERCOCET) 5-325 MG per tablet 1 tablet   Once     Route: Oral  Ordered Dose: 1 tablet     Last MAR action:  Given Jeani Fassnacht J             MDM  Number of Diagnoses or Management Options  Elevated blood pressure:   Multiple sclerosis:   Total body pain:   Diagnosis management comments: Dr. Marlane Hatcher  is the primary attending for this patient and has obtained and performed the history, PE, and medical decision making for this patient.    Oxygen saturation by pulse oximetry is 95%-100%, Normal.  Interventions: None Needed and Patient Observed.    Pain improved on re check, discussed with patient about followup and plan of care. Encouraged to return for any worsening symptoms.     Patient Progress  Patient progress: stable            Procedures    Clinical Impression & Disposition     Clinical Impression  Final diagnoses:   Total body pain   Multiple sclerosis   Elevated blood pressure        ED Disposition     Discharge Ronit M Mares discharge to home/self care.    Condition at disposition: Stable             Discharge Medication List as of 12/18/2014  5:54 AM      START taking these medications    Details   oxyCODONE-acetaminophen (PERCOCET) 5-325 MG per tablet 1-2 tablets by mouth every 4-6 hours as needed for pain;  Do not drive or operate machinery while taking this medicine, Print                         Coy Vandoren, Lindalou Hose, MD  12/18/14 [phone]

## 2014-12-18 NOTE — Discharge Instructions (Signed)
Chronic Pain Exacerbation    You have been seen for your chronic pain problem.    The Emergency Department/Urgent Care is available for emergencies related to your painful problem. However, you need to see your regular doctor for ongoing care for your pain.    ALL pain medicine refills MUST be done by your primary care doctor or the pain specialist who takes care of your pain.    The Emergency Department/Urgent Care is not the right place for the ongoing care of chronic pain.    If you have not seen a pain specialist, ask the doctor for information. He or she can give you information on who to contact and help refer you to one.    If you feel you are not able to get proper pain treatment, contact your health plan for advice.    YOU SHOULD SEEK MEDICAL ATTENTION IMMEDIATELY, EITHER HERE OR AT THE NEAREST EMERGENCY DEPARTMENT, IF ANY OF THE FOLLOWING OCCURS:   Any major change in your pain pattern.   Symptoms become worse.   You have any other concerns.    If you can t follow up with your doctor, or if at any time you feel you need to be rechecked or seen again, come back here or go to the nearest emergency department.    Chronic Pain Exacerbation    You have been seen for your chronic pain problem.    The Emergency Department/Urgent Care is available for emergencies related to your painful problem. However, you need to see your regular doctor for ongoing care for your pain.    ALL pain medicine refills MUST be done by your primary care doctor or the pain specialist who takes care of your pain.    The Emergency Department/Urgent Care is not the right place for the ongoing care of chronic pain.    If you have not seen a pain specialist, ask the doctor for information. He or she can give you information on who to contact and help refer you to one.    If you feel you are not able to get proper pain treatment, contact your health plan for advice.    YOU MUST PLAN IN ADVANCE! If you are running low on  pain medicine, plan in advance and get a refill from your doctor. This is your responsibility. NEVER wait until a weekend to try to reach your doctor if you are running out of pain medicine. Emergency /Urgent Care Physicians usually only give a single dose of pain medicine. This means you may not have enough for the rest of the weekend.    THIS STATEMENT IS PROVIDED FOR YOUR PROTECTION AND FOR YOUR INFORMATION ONLY AND IS NOT MEANT TO IMPLY ANY ILLEGAL ACTIVITY ON YOUR PART: It is a serious crime to lie to a doctor to get narcotic pain medicine. This includes but is not limited to: Getting multiple prescriptions for pain medicine by seeing more than one doctor, giving false information to get a prescription and lying about your medical condition. This includes the kind of condition and how serious it is.   Some common narcotic pain medicines: oxycodone/acetaminophen (Percocet, Roxicet), hydrocodone/acetaminophen (Vicodin, Lortab, Norco), oxycodone-long acting (OxyContin), meperidine (Demerol), hydromorphone (Dilaudid), morphine (MSIR, MS Contin, Kadian, Avinza, Oramorph, Roxanol, Astramorph, Duramorph, others), codeine/acetaminophen (T3, T4), acetaminophen/propoxyphene (Darvocet, Wygesic), propoxyphene (Darvon).    YOU SHOULD SEEK MEDICAL ATTENTION IMMEDIATELY, EITHER HERE OR AT THE NEAREST EMERGENCY DEPARTMENT, IF ANY OF THE FOLLOWING OCCURS:   Any major change in  your pain pattern.   Symptoms become worse.   You have any other concerns.    If you can t follow up with your doctor, or if at any time you feel you need to be rechecked or seen again, come back here or go to the nearest emergency department.    Elevated Blood Pressure    During your visit today your blood pressure was higher than normal.    Check your blood pressure several times over the next several days, then follow up with your regular doctor. If you do not have a doctor, ask the medical staff to refer you to  one.    You may need medication for your blood pressure if it stays high. Untreated high blood pressure can cause damage to your heart and kidneys and may lead to a heart attack or stroke. It is VERY IMPORTANT to follow up with your doctor.   Check your blood pressure daily and follow up with your doctor.   A doctor will diagnose high blood pressure only if your blood pressure is high for several days. Many pharmacies have machines that let you check your own blood pressure. You can also check with a fire station to see whether a paramedic will take your blood pressure. Another option is to purchase a blood pressure monitor to use at home. These are available at most pharmacies.     YOU SHOULD SEEK MEDICAL ATTENTION IMMEDIATELY, EITHER HERE OR AT THE NEAREST EMERGENCY DEPARTMENT, IF ANY OF THE FOLLOWING OCCURS:   You have a sudden or severe headache.   You are numb, tingly, or weak on one side of your body, half of your face droops, or you have trouble speaking.   You have chest pain.   You are short of breath.

## 2015-02-12 ENCOUNTER — Encounter (INDEPENDENT_AMBULATORY_CARE_PROVIDER_SITE_OTHER): Payer: Self-pay | Admitting: Family

## 2015-02-12 ENCOUNTER — Ambulatory Visit (INDEPENDENT_AMBULATORY_CARE_PROVIDER_SITE_OTHER): Payer: BC Managed Care – PPO | Admitting: Family

## 2015-02-12 VITALS — BP 129/90 | HR 67 | Temp 98.2°F | Resp 16 | Ht 66.0 in | Wt 183.0 lb

## 2015-02-12 DIAGNOSIS — J029 Acute pharyngitis, unspecified: Secondary | ICD-10-CM

## 2015-02-12 LAB — POCT RAPID STREP A: Rapid Strep A Screen POCT: NEGATIVE

## 2015-02-12 MED ORDER — AZITHROMYCIN 250 MG PO TABS
ORAL_TABLET | ORAL | Status: AC
Start: 2015-02-12 — End: 2015-02-17

## 2015-02-12 NOTE — Patient Instructions (Signed)
Pharyngitis (Sore Throat), Report Pending      Pharyngitis (sore throat) is often due to a virus. It can also be caused by the"strep"streptococcus, or strep,bacteria,oftencalled"strep throat"strep throat. Both viral and strep infectionscan cause throat pain that is worse when swallowing, aching all over with headache,and fever. Both types of infections are contagious. They may be spread by coughing, kissing,or touching others after touching your mouth or nose.  A test has been done to determine whether or not you (or your child, if your child is the patient) have strep throat. Call this facility as directed for the result. If the test ispositivefor strep infection, treament with antibiotic medications is needed. A prescription can be called in to your pharmacy at that time. If the test isnegative,you probably have a viral pharyngitis. Thisdoes notrequire antibiotic treatment.Until you receive the results of the strep test, you should stay home from work. If your child is being tested, he or she should stay home from school.  Home care   Rest at home. Drink plenty of fluids to avoid dehydration.   If the test is positive for strep, no work or school for the first 2 days of taking the antibiotics. After this time, you will not be contagious. You can then return to work or school if you are feeling better.   The antibiotic medication must be taken for the full 10 days, even if you feel better. This is very important to ensure the infection is treated. It is also important to prevent drug-resistent organisms from developing.If you were given an antibiotic shot, no more antibiotics are needed.   For children:Use acetaminophen for fever, fussiness or discomfort. In infants over six months of age, you may use ibuprofen instead of acetaminophen. (NOTE: If your child has chronic liver or kidney disease or ever had a stomach ulcer or GI bleeding, talk with your doctor before using these medicines.)  (NOTE: Aspirin should never be used in anyone under 18 years of age who is ill with a fever. It may cause severe liver damage.)   For adults:You may use acetaminophen or ibuprofen to control pain or fever, unless another medicine was prescribed for this. (NOTE: If you have chronic liver or kidney disease or ever had a stomach ulcer or GI bleeding, talk with your doctor before using these medicines.)   Throat lozenges or numbing throat sprays can help reduce pain. Gargling with warm salt water will also help reduce throat pain. For this, dissolve 1/2 teaspoon of salt in 1 glass of warm water. To help soothe a sore throat, children can sip on juice or a popsicle. Children 5 years and older can also suck on a lollipop or hard candy.   Avoid salty or spicy foods, which can irritate the throat.  Follow-up care  Follow up with your healthcare provider or our staff if you are not improving over the next week.  When to seek medical advice  Call your healthcare provider right away if any of these occur:   Feveras directed by your doctor. For children, seek care if:   Your child is of any age and has repeated fevers above 104F (40C).   Your child is younger than 2 years of age and has a fever of 100.4F (38C) that continues for more than 1 day.   Your child is 2 years old or older and has a fever of 100.4F (38C) that continues for more than 3 days.   New or worsening ear pain, sinus pain, or   headache   Painful lumps in the back of neck   Stiff neck   Lymph nodes are getting larger   Inability to swallow liquids, excessive drooling,or inability to open mouth wide due to throat pain   Signs of dehydration (very dark urine or no urine, sunken eyes, dizziness)   Trouble breathing or noisy breathing   Muffled voice   New rash   Child appears to be getting sicker   2000-2015 [employer] [address], [place] [address]. All rights reserved. This information is not intended as a  substitute for professional medical care. Always follow your healthcare professional's instructions.

## 2015-02-12 NOTE — Progress Notes (Signed)
URGENT  CARE    PROGRESS NOTE      Patient: Judy Freeman   Date: 02/12/2015   MRN: [id number]     Past Medical History   Diagnosis Date   . MS (multiple sclerosis)    . Pneumonia 2011, 2012   . Sepsis 2011   . Asthma      Social History     Social History   . Marital Status: Single     Spouse Name: N/A   . Number of Children: N/A   . Years of Education: N/A     Occupational History   . Not on file.     Social History Main Topics   . Smoking status: Never Smoker    . Smokeless tobacco: Never Used   . Alcohol Use: Yes      Comment: socially   . Drug Use: No      Comment: for her MS   . Sexual Activity: Yes     Other Topics Concern   . Not on file     Social History Narrative     Family History   Problem Relation Age of Onset   . Cancer Father    . Diabetes Maternal Aunt    . Cancer Maternal Aunt        ASSESSMENT/PLAN     Etha M Auzenne is a 39 y.o. female    Chief Complaint   Patient presents with   . Sore Throat        1. Pharyngitis, unspecified etiology  - azithromycin (ZITHROMAX) 250 MG tablet; Two tabs PO day 1 and then one tab PO daily days 2-5  Dispense: 6 tablet; Refill: 0  - POCT Rapid Group A Strep  - Culture, Throat Screen    Rapid strep is negative. Will send culture and follow up on result. Pt given printed prescription and instructions to start if throat pain worsens or there is fever or she receives a phone call informing her of a positive strep result. Acetaminophen (Tylenol) or Ibuprofen can be used for relief of pain and fever.  Salt water gargles, anesthetic lozenges, cool-mist humidifier can help to relieve sore throat symptoms.  Wash hands frequently or used alcohol based hand sanitizer.  Increase fluid intake to 2-3 per day.  Get plenty of rest.  You may return to your normal activities when you begin to feel better.             Results for orders placed or performed in visit on 02/12/15   POCT Rapid Group A Strep   Result Value Ref Range    POCT QC Pass     Rapid Strep A Screen POCT  Negative  Negative    Comment       Negative Results should be confirmed by throat Cx to confirm absence of Strep A inf.     Risk & Benefits of the Azithromycin were explained to the patient who verbalized understanding & agreed to the treatment plan. Patient was encouraged to contact me/clinical staff with any questions/concerns      MEDICATIONS     Current Outpatient Prescriptions   Medication Sig Dispense Refill   . acetaminophen (TYLENOL) 325 MG tablet Take 650 mg by mouth.     . ALPRAZolam (XANAX) 1 MG tablet Take 1 tablet (1 mg total) by mouth daily. 30 tablet 1   . AUBAGIO 7 MG Tab      . azithromycin (ZITHROMAX) 250 MG tablet Two tabs  PO day 1 and then one tab PO daily days 2-5 6 tablet 0   . baclofen (LIORESAL) 10 MG tablet Take 10 mg by mouth 3 (three) times daily.     . betamethasone, augmented, (DIPROLENE) 0.05 % cream APPLY TO URTICARIA BID AS NEEDED EXTERNALLY  2   . carisoprodol (SOMA) 350 MG tablet TK 1 T PO  BID  3   . clonazePAM (KLONOPIN) 0.25 MG disintegrating tablet DIS ONE T PO  BID  5   . DULERA 200-5 MCG/ACT Aerosol INL 2 PFS PO BID  3   . DULoxetine (CYMBALTA) 30 MG capsule TK ONE C PO  QAM  11   . famotidine (PEPCID) 40 MG tablet TK 1 T PO QD HS  3   . fluticasone (FLONASE) 50 MCG/ACT nasal spray USE 2 SPRAYS NASALLY ONCE D  3   . HYDROcodone-acetaminophen (NORCO) 5-325 MG per tablet Take 1-2 tablets by mouth every 6 (six) hours as needed. 20 tablet 0   . HYDROmorphone (DILAUDID) 2 MG tablet Take 1 tablet (2 mg total) by mouth every 6 (six) hours as needed for Pain (As needed for pain). 20 tablet 0   . levocetirizine (XYZAL) 5 MG tablet TK 1 T PO QD  3   . lidocaine (LIDODERM) 5 % APPLY 2-3 PATCHES 12 HOURS ON AND 12 HOURS OFF  3   . LYRICA 50 MG capsule TK ONE C PO  BID  0   . meloxicam (MOBIC) 7.5 MG tablet   0   . PATADAY 0.2 % Solution   3   . predniSONE (DELTASONE) 20 MG tablet      . PROAIR HFA 108 (90 BASE) MCG/ACT inhaler USE 2 PUFFS PO Q 4 H FOR 30 DAYS.  3   . promethazine  (PHENERGAN) 25 MG tablet Take 1 tablet (25 mg total) by mouth every 6 (six) hours as needed for Nausea. 15 tablet 0   . Spacer/Aero-Holding Chambers (OPTICHAMBER DIAMOND-LG MASK) Device U UTD  3   . tolterodine (DETROL) 2 MG tablet TK 1 T PO  ONE TO TWO TIMES A DAY FOR OVERACTIVE BLADDER.  11   . topiramate (TOPAMAX) 50 MG tablet TK 1 T PO  BID  3     No current facility-administered medications for this visit.       Allergies   Allergen Reactions   . Penicillins        SUBJECTIVE     Chief Complaint   Patient presents with   . Sore Throat        Sore Throat   This is a new problem. The current episode started yesterday. The problem has been unchanged. There has been no fever. The pain is at a severity of 7/10. The pain is moderate. Associated symptoms include coughing, a hoarse voice, neck pain, swollen glands and trouble swallowing. Pertinent negatives include no abdominal pain, congestion, diarrhea, drooling, ear discharge, ear pain, headaches, shortness of breath, stridor or vomiting. She has had exposure to strep (he was negative on Rapid strep and positive on his culture). Exposure to: pt brother had strep. Treatments tried: NyQuil cold.   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 Hx: MS  ROS     Review of Systems   Constitutional: Positive for diaphoresis, appetite change and fatigue. Negative for fever and chills.   HENT: Positive for hoarse voice, sore throat, trouble swallowing and voice change. Negative for congestion, drooling, ear discharge, ear pain, postnasal drip and sinus pressure.  Eyes: Negative for pain, redness and itching.   Respiratory: Positive for cough. Negative for chest tightness, shortness of breath and stridor.    Gastrointestinal: Negative for vomiting, abdominal pain and diarrhea.   Musculoskeletal: Positive for myalgias and neck pain.   Skin: Negative for rash.   Neurological: Negative for dizziness, light-headedness and headaches.   Hematological: Positive for adenopathy.       The following  portions of the patient's history were reviewed and updated as appropriate: Allergies, Current Medications, Past Family History, Past Medical history, Past social history, Past surgical history, and Problem List.    PHYSICAL EXAM     Filed Vitals:    02/12/15 1932   BP: 129/90   Pulse: 67   Temp: 98.2 F (36.8 C)   TempSrc: Oral   Resp: 16   Height: 1.676 m (5\' 6" )   Weight: 83.008 kg (183 lb)   SpO2: 98%       Physical Exam   Nursing note and vitals reviewed.  Constitutional: She is oriented to person, place, and time. She appears well-developed and well-nourished. No distress.   HENT:   Head: Normocephalic and atraumatic.   Right Ear: Hearing, tympanic membrane, external ear and ear canal normal.   Left Ear: Hearing, tympanic membrane, external ear and ear canal normal.   Nose: Nose normal. Right sinus exhibits no maxillary sinus tenderness and no frontal sinus tenderness. Left sinus exhibits no maxillary sinus tenderness and no frontal sinus tenderness.   Mouth/Throat: Uvula is midline. Oropharyngeal exudate, posterior oropharyngeal edema and posterior oropharyngeal erythema present.   Eyes: Conjunctivae and lids are normal. Pupils are equal, round, and reactive to light. Right eye exhibits no discharge. Left eye exhibits no discharge.   Neck: Normal range of motion. Neck supple.   Cardiovascular: Normal rate, regular rhythm and normal heart sounds.  Exam reveals no gallop and no friction rub.    No murmur heard.  Pulmonary/Chest: Effort normal and breath sounds normal. No respiratory distress. She has no decreased breath sounds. She has no wheezes. She has no rales.   Lymphadenopathy:     She has cervical adenopathy (anterior).   Neurological: She is alert and oriented to person, place, and time.   Skin: Skin is warm and dry. No rash noted. She is not diaphoretic.   Psychiatric: She has a normal mood and affect. Her behavior is normal. Judgment and thought content normal.     Ortho Exam  Neurologic Exam      Mental Status   Oriented to person, place, and time.     Cranial Nerves     CN III, IV, VI   Pupils are equal, round, and reactive to light.      PROCEDURE(S)     Procedures        Signed,  Morene Crocker, NP  02/12/2015

## 2015-02-15 LAB — CULTURE, THROAT SCREEN

## 2015-04-08 ENCOUNTER — Encounter (INDEPENDENT_AMBULATORY_CARE_PROVIDER_SITE_OTHER): Payer: BC Managed Care – PPO | Admitting: Internal Medicine

## 2015-04-08 DIAGNOSIS — Z029 Encounter for administrative examinations, unspecified: Secondary | ICD-10-CM

## 2015-06-26 ENCOUNTER — Emergency Department: Payer: BC Managed Care – PPO

## 2015-06-26 ENCOUNTER — Emergency Department
Admission: EM | Admit: 2015-06-26 | Discharge: 2015-06-26 | Disposition: A | Discharge status: Home / Self Care | Payer: BC Managed Care – PPO | Attending: Emergency Medicine | Admitting: Emergency Medicine

## 2015-06-26 DIAGNOSIS — R05 Cough: Secondary | ICD-10-CM | POA: Insufficient documentation

## 2015-06-26 DIAGNOSIS — R059 Cough, unspecified: Secondary | ICD-10-CM

## 2015-06-26 DIAGNOSIS — R197 Diarrhea, unspecified: Secondary | ICD-10-CM | POA: Insufficient documentation

## 2015-06-26 DIAGNOSIS — R531 Weakness: Secondary | ICD-10-CM | POA: Insufficient documentation

## 2015-06-26 DIAGNOSIS — G35 Multiple sclerosis: Secondary | ICD-10-CM | POA: Insufficient documentation

## 2015-06-26 LAB — CBC AND DIFFERENTIAL
Basophils Absolute Automated: 0.03 10*3/uL (ref 0.00–0.20)
Basophils Automated: 1 %
Eosinophils Absolute Automated: 0.14 10*3/uL (ref 0.00–0.70)
Eosinophils Automated: 4 %
Hematocrit: 35.6 % — ABNORMAL LOW (ref 37.0–47.0)
Hgb: 11.6 g/dL — ABNORMAL LOW (ref 12.0–16.0)
Lymphocytes Absolute Automated: 1.13 10*3/uL (ref 0.50–4.40)
Lymphocytes Automated: 33 %
MCH: 27.6 pg — ABNORMAL LOW (ref 28.0–32.0)
MCHC: 32.6 g/dL (ref 32.0–36.0)
MCV: 84.6 fL (ref 80.0–100.0)
MPV: 10.3 fL (ref 9.4–12.3)
Monocytes Absolute Automated: 0.36 10*3/uL (ref 0.00–1.20)
Monocytes: 11 %
Neutrophils Absolute: 1.72 10*3/uL — ABNORMAL LOW (ref 1.80–8.10)
Neutrophils: 51 %
Platelets: 287 10*3/uL (ref 140–400)
RBC: 4.21 10*6/uL (ref 4.20–5.40)
RDW: 15 % (ref 12–15)
WBC: 3.38 10*3/uL — ABNORMAL LOW (ref 3.50–10.80)

## 2015-06-26 LAB — URINALYSIS
Bilirubin, UA: NEGATIVE
Blood, UA: NEGATIVE
Glucose, UA: NEGATIVE
Ketones UA: NEGATIVE
Leukocyte Esterase, UA: NEGATIVE
Nitrite, UA: NEGATIVE
Protein, UR: NEGATIVE
Specific Gravity UA: 1.025 (ref 1.001–1.035)
Urine pH: 7 (ref 5.0–8.0)
Urobilinogen, UA: NORMAL mg/dL (ref 0.2–2.0)

## 2015-06-26 LAB — ECG 12-LEAD
Atrial Rate: 71 {beats}/min
P Axis: 6 degrees
P-R Interval: 148 ms
Q-T Interval: 390 ms
QRS Duration: 82 ms
QTC Calculation (Bezet): 423 ms
R Axis: 43 degrees
T Axis: 31 degrees
Ventricular Rate: 71 {beats}/min

## 2015-06-26 LAB — COMPREHENSIVE METABOLIC PANEL
ALT: 9 U/L (ref 0–55)
AST (SGOT): 14 U/L (ref 5–34)
Albumin/Globulin Ratio: 1 (ref 0.9–2.2)
Albumin: 4.2 g/dL (ref 3.5–5.0)
Alkaline Phosphatase: 50 U/L (ref 37–106)
Anion Gap: 9 (ref 5.0–15.0)
BUN: 9 mg/dL (ref 7.0–19.0)
Bilirubin, Total: 0.9 mg/dL (ref 0.2–1.2)
CO2: 25 mEq/L (ref 22–29)
Calcium: 9.1 mg/dL (ref 8.5–10.5)
Chloride: 103 mEq/L (ref 100–111)
Creatinine: 0.8 mg/dL (ref 0.6–1.0)
Globulin: 4.3 g/dL — ABNORMAL HIGH (ref 2.0–3.6)
Glucose: 85 mg/dL (ref 70–100)
Potassium: 3.9 mEq/L (ref 3.5–5.1)
Protein, Total: 8.5 g/dL — ABNORMAL HIGH (ref 6.0–8.3)
Sodium: 137 mEq/L (ref 136–145)

## 2015-06-26 LAB — HCG, SERUM, QUALITATIVE: Hcg Qualitative: NEGATIVE

## 2015-06-26 LAB — TROPONIN I: Troponin I: <0.01 ng/mL (ref 0.00–0.09)

## 2015-06-26 LAB — GFR: EGFR: >60

## 2015-06-26 MED ORDER — ALBUTEROL SULFATE (2.5 MG/3ML) 0.083% IN NEBU
2.5000 mg | INHALATION_SOLUTION | RESPIRATORY_TRACT | Status: AC | PRN
Start: 2015-06-26 — End: 2015-07-26

## 2015-06-26 MED ORDER — ALBUTEROL SULFATE (2.5 MG/3ML) 0.083% IN NEBU
2.5000 mg | INHALATION_SOLUTION | Freq: Once | RESPIRATORY_TRACT | Status: AC
Start: 2015-06-26 — End: 2015-06-26
  Administered 2015-06-26: 2.5 mg via RESPIRATORY_TRACT
  Filled 2015-06-26: qty 3

## 2015-06-26 MED ORDER — IPRATROPIUM BROMIDE 0.02 % IN SOLN
0.5000 mg | Freq: Once | RESPIRATORY_TRACT | Status: AC
Start: 2015-06-26 — End: 2015-06-26
  Administered 2015-06-26: 0.5 mg via RESPIRATORY_TRACT
  Filled 2015-06-26: qty 2.5

## 2015-06-26 MED ORDER — SODIUM CHLORIDE 0.9 % IV BOLUS
1000.0000 mL | Freq: Once | INTRAVENOUS | Status: AC
Start: 2015-06-26 — End: 2015-06-26
  Administered 2015-06-26: 1000 mL via INTRAVENOUS

## 2015-06-26 NOTE — ED Provider Notes (Signed)
Physician/Midlevel provider first contact with patient: 06/26/15 1510         History     Chief Complaint   Patient presents with   . Shortness of Breath     HPI     Patient Name: Judy Freeman, Judy Freeman, 40 y.o., female      DR. Kennith Morss  is the primary attending for this patient and has obtained and performed the history, PE, and medical decision making for this patient.    History obtained by: patient  Chief Complaint/Onset/Duration/Quality/Location/Severity/Context: Patient presents with coughing, wheezing starting at 4 AM today.  She states she is having some coughing and wheezing throughout 1-2 weeks but no sig worse today.  Symptoms are currently mild in nature.  Also reports diarrhea all day today.  States she feels generally weak today.  No focal neurological weakness.  Aggravating Factors/Alleviating Factors: Aggravated by coughing. Alleviated by nothing despite using her inhalers.  Associated Symptoms: Denies fever, chills, abdominal pain, chest pain, focal neurological weakness.        *This note was generated by the Epic EMR system/ Dragon speech recognition and may contain inherent errors or omissions not intended by the user. Grammatical errors, random word insertions, deletions, pronoun errors and incomplete sentences are occasional consequences of this technology due to software limitations. Not all errors are caught or corrected. If there are questions or concerns about the content of this note or information contained within the body of this dictation they should be addressed directly with the author for clarification          Past Medical History   Diagnosis Date   . MS (multiple sclerosis)    . Pneumonia 2011, 2012   . Sepsis 2011   . Asthma        Past Surgical History   Procedure Laterality Date   . Cesarean section     . Cholecystectomy     . Debridement & irrigation, abscess  2015       Family History   Problem Relation Age of Onset   . Cancer Father    . Diabetes Maternal Aunt    . Cancer  Maternal Aunt        Social  Social History   Substance Use Topics   . Smoking status: Never Smoker    . Smokeless tobacco: Never Used   . Alcohol Use: Yes      Comment: socially       .     Allergies   Allergen Reactions   . Penicillins Hives       Home Medications     Last Medication Reconciliation Action:  Complete Cloyde Reams, RN 06/26/2015  4:00 PM                  acetaminophen (TYLENOL) 325 MG tablet     Take 650 mg by mouth.     AUBAGIO 7 MG Tab          baclofen (LIORESAL) 10 MG tablet     Take 10 mg by mouth 3 (three) times daily.     carisoprodol (SOMA) 350 MG tablet     TK 1 T PO  BID     Cholecalciferol (VITAMIN D) 1000 UNIT tablet     Take 1,000 Units by mouth daily.     clonazePAM (KLONOPIN) 0.25 MG disintegrating tablet     DIS ONE T PO  BID     DULERA 200-5 MCG/ACT Aerosol  INL 2 PFS PO BID     famotidine (PEPCID) 40 MG tablet     TK 1 T PO QD HS     fluticasone (FLONASE) 50 MCG/ACT nasal spray     USE 2 SPRAYS NASALLY ONCE D     levocetirizine (XYZAL) 5 MG tablet     TK 1 T PO QD     lidocaine (LIDODERM) 5 %     APPLY 2-3 PATCHES 12 HOURS ON AND 12 HOURS OFF     LYRICA 50 MG capsule     TK ONE C PO  BID     meloxicam (MOBIC) 7.5 MG tablet          PATADAY 0.2 % Solution          PROAIR HFA 108 (90 BASE) MCG/ACT inhaler     USE 2 PUFFS PO Q 4 H FOR 30 DAYS.     Spacer/Aero-Holding Chambers (OPTICHAMBER DIAMOND-LG MASK) Device     U UTD     tolterodine (DETROL) 2 MG tablet     TK 1 T PO  ONE TO TWO TIMES A DAY FOR OVERACTIVE BLADDER.     topiramate (TOPAMAX) 50 MG tablet     TK 1 T PO  BID                                                                                 Review of Systems   Constitutional: Negative for fever and chills.   HENT: Negative for sore throat.    Respiratory: Positive for cough, shortness of breath and wheezing.    Cardiovascular: Negative for chest pain.   Gastrointestinal: Positive for diarrhea. Negative for nausea, vomiting and abdominal pain.    Musculoskeletal: Negative for back pain and neck pain.   Skin: Negative for pallor and rash.   Neurological: Positive for weakness. Negative for syncope, numbness and headaches.   All other systems reviewed and are negative.      Physical Exam    BP: 131/79 mmHg, Heart Rate: 77, Temp: 98.7 F (37.1 C), Resp Rate: 20, SpO2: 100 %, Weight: 84.369 kg    Physical Exam   Constitutional: She is oriented to person, place, and time. She appears distressed.   HENT:   Head: Normocephalic and atraumatic.   Mouth/Throat: Oropharynx is clear and moist.   Eyes: Conjunctivae and EOM are normal.   Neck: Normal range of motion. Neck supple.   Cardiovascular: Normal rate, regular rhythm, normal heart sounds and intact distal pulses.    Pulmonary/Chest: Effort normal and breath sounds normal. No respiratory distress.   Abdominal: Soft. Bowel sounds are normal. She exhibits no distension. There is no tenderness. There is no CVA tenderness.   Musculoskeletal: Normal range of motion. She exhibits no edema.   Neurological: She is alert and oriented to person, place, and time. She has normal strength. No cranial nerve deficit. Coordination normal.   No focal neurological weakness present.   Skin: Skin is warm and dry. No rash noted.   Nursing note and vitals reviewed.        MDM and ED Course     ED Medication Orders     Start  Ordered     Status Ordering Provider    06/26/15 1530 06/26/15 1518  sodium chloride 0.9 % bolus 1,000 mL   Once     Route: Intravenous  Ordered Dose: 1,000 mL     Last MAR action:  New Bag [facility]    06/26/15 1530 06/26/15 1529  albuterol (PROVENTIL) nebulizer solution 2.5 mg   RT - Once     Route: Nebulization  Ordered Dose: 2.5 mg     Last MAR action:  Given Anda Sobotta    06/26/15 1530 06/26/15 1529  ipratropium (ATROVENT) 0.02 % nebulizer solution 0.5 mg   RT - Once     Route: Nebulization  Ordered Dose: 0.5 mg     Last MAR action:  Given Helayne Seminole             MDM  Number of Diagnoses or  Management Options  Diagnosis management comments: DR. Helayne Seminole  is the primary attending for this patient and has obtained and performed the history, PE, and medical decision making for this patient.        differential diagnosis to include but not limited to : electrolyte imbalance, infection, cad.         Amount and/or Complexity of Data Reviewed  Clinical lab tests: ordered and reviewed  Tests in the radiology section of CPT: ordered and reviewed  Independent visualization of images, tracings, or specimens: yes    Risk of Complications, Morbidity, and/or Mortality  Presenting problems: high  Diagnostic procedures: high  Management options: high      Oxygen saturation by pulse oximetry is 95%-100%, Normal.  Interventions: None Needed.    Attending Dr. Helayne Seminole        EKG Interpretation  EKG interpreted independently by ED physician  Rate: Normal for age.  Rhythm: Normal sinus rhythm  Axis: Normal for age  PR, QRS and QT intervals:  normal for age and rate  ST Segments: No deviations suggestive of ischemia  Impression: Normal ECG with no evidence of ischemia.    Attending : Dr. Helayne Seminole      Dr. Helayne Seminole, ED Physician visualized and interpreted images of CXR  Intrepretation by ED Physician: no pneumothorax, no acute infiltrate, no cardiomegaly, negative for acute process.        Patient course and reassessment prior to discharge : pt improved, no acute focal deficits. No pneumonia. Breathing improved after nebs, albuterol vial rx given.    Counseled pt about diagnosis. Recommended immediate ED visit for any new or worsening problems or concerns. Patient expressed understanding and agreement with discharge plan. Well appearing at discharge. Close and timed follow up recommended.    Results     Procedure Component Value Units Date/Time    Troponin I [[id number]] Collected:  06/26/15 1540    Specimen Information:  Blood Updated:  06/26/15 1611     Troponin I <0.01 ng/mL     Comprehensive  metabolic panel [[id number]]  (Abnormal) Collected:  06/26/15 1540    Specimen Information:  Blood Updated:  06/26/15 1611     Glucose 85 mg/dL      BUN 9.0 mg/dL      Creatinine 0.8 mg/dL      Sodium 629 mEq/L      Potassium 3.9 mEq/L      Chloride 103 mEq/L      CO2 25 mEq/L      Calcium 9.1 mg/dL      Protein, Total 8.5 (H) g/dL  Albumin 4.2 g/dL      AST (SGOT) 14 U/L      ALT 9 U/L      Alkaline Phosphatase 50 U/L      Bilirubin, Total 0.9 mg/dL      Globulin 4.3 (H) g/dL      Albumin/Globulin Ratio 1.0      Anion Gap 9.0     GFR [[id number]] Collected:  06/26/15 1540     EGFR >60.0 Updated:  06/26/15 1611    hCG, Qualitative (Pos/Neg) [[id number]] Collected:  06/26/15 1540    Specimen Information:  Blood Updated:  06/26/15 1600     Hcg Qualitative Negative     UA with reflex to micro (pts  3 + yrs) [[id number]] Collected:  06/26/15 1540    Specimen Information:  Urine from Urine, Clean Catch Updated:  06/26/15 1550     Urine Type Clean Catch      Color, UA Straw      Clarity, UA Slt.Cloudy      Specific Gravity UA 1.025      Urine pH 7.0      Leukocyte Esterase, UA Negative      Nitrite, UA Negative      Protein, UR Negative      Glucose, UA Negative      Ketones UA Negative      Urobilinogen, UA Normal mg/dL      Bilirubin, UA Negative      Blood, UA Negative     CBC with differential [[id number]]  (Abnormal) Collected:  06/26/15 1540    Specimen Information:  Blood from Blood Updated:  06/26/15 1549     WBC 3.38 (L) x10 3/uL      Hgb 11.6 (L) g/dL      Hematocrit 28.4 (L) %      Platelets 287 x10 3/uL      RBC 4.21 x10 6/uL      MCV 84.6 fL      MCH 27.6 (L) pg      MCHC 32.6 g/dL      RDW 15 %      MPV 10.3 fL      Neutrophils 51 %      Lymphocytes Automated 33 %      Monocytes 11 %      Eosinophils Automated 4 %      Basophils Automated 1 %      Immature Granulocyte Unmeasured %      Nucleated RBC Unmeasured /100 WBC      Neutrophils Absolute 1.72 (L) x10 3/uL      Abs Lymph Automated 1.13 x10 3/uL      Abs  Mono Automated 0.36 x10 3/uL      Abs Eos Automated 0.14 x10 3/uL      Absolute Baso Automated 0.03 x10 3/uL      Absolute Immature Granulocyte Unmeasured x10 3/uL           Radiology Results (24 Hour)     Procedure Component Value Units Date/Time    XR Chest 2 Views [[id number]] Collected:  06/26/15 1630    Order Status:  Completed Updated:  06/26/15 1636    Narrative:      HISTORY: Cough and wheezing    PA and lateral chest:  Lungs are clear with normal pulmonary  vascularity.  No pleural effusion, hilar or mediastinal prominence is  evident.  The heart size and contour are normal.  There are surgical  clips in the right upper quadrant.  Surrounding osseous and soft tissues  are unremarkable.      Impression:        Normal study.    Mickel Duhamel, MD   06/26/2015 4:32 PM                Procedures    Clinical Impression & Disposition     Clinical Impression  Final diagnoses:   Diarrhea, unspecified type   Weakness   Coughing        ED Disposition     Discharge [facility] Westfall discharge to home/self care.    Condition at disposition: Stable             New Prescriptions    No medications on file                   Helayne Seminole, MD  06/26/15 1649

## 2015-06-26 NOTE — ED Notes (Signed)
C/o wheezing x approx 2 weeks with chills and night sweats, diarrhea today, and generalized fatigue. Hx MS.

## 2015-06-26 NOTE — Discharge Instructions (Signed)
Dehydration    You have been diagnosed with dehydration.    Dehydration is when your body is low on fluids (liquids). Dehydration has a variety of causes. These range from vomiting and diarrhea, to excessive (a lot of) sweating and poor appetite.    You have received intravenous (IV) fluids. These are to help fix your dehydration. It is important to keep hydrating yourself at home. Drink plenty of fluids frequently. Drink fluids that won t upset your stomach. This includes water and juice. It also includes drinks like Gatorade. Stay away from beverages like soda pop and tea. They may make you worse. Avoid caffeine and alcohol. They may cause you to lose more fluid.    YOU SHOULD SEEK MEDICAL ATTENTION IMMEDIATELY, EITHER HERE OR AT THE NEAREST EMERGENCY DEPARTMENT, IF ANY OF THE FOLLOWING OCCURS:   Fever (temperature higher than 100.62F / 38C) or shaking chills.   Constant vomiting and/or diarrhea.   Lightheadedness or fainting.   If you do not urinate (pee) for 8 or more hours.              Weakness / Fatigue    You have been seen today for generalized weakness. This may also be described as fatigue.    Weakness is a common problem, especially in older individuals.    It is important to understand the difference between true weakness (real weakness from a nerve or brain problem) and the more common problem of fatigue. These words might seem similar but they do mean very different problems.   Fatigue: When a person is describing fatigue, they may feel tired out very quickly even with just a little activity. They may also say they are feeling tired, sleepy, easily exhausted and unable to do normal daily activities because they don't seem to have enough energy.   True Weakness: When someone has true weakness, it means that the muscles are not working right. For example, a leg might be truly weak if you can t support your weight on it or if you can't get up from a chair because the thigh muscles aren t  strong enough.    There are many causes of weakness including; Infections (often kidney/bladder infections or pneumonias), electrolyte abnormalities (low sodium, low potassium), depression, and neurologic (brain or nerve) disorders.    After looking at the results of the blood tests or X-rays, the cause of your weakness is:   Unclear or unknown.    It is VERY IMPORTANT to see your primary care doctor. More testing may be needed to figure out the cause of your weakness.    YOU SHOULD SEEK MEDICAL ATTENTION IMMEDIATELY, EITHER HERE OR AT THE NEAREST EMERGENCY DEPARTMENT, IF ANY OF THE FOLLOWING OCCURS:   Confusion, coma, agitation (becoming anxious or irritable).   Fever (temperature higher than 100.62F / 38C), vomiting.   Severe headache.   Signs of stroke (paralysis or numbness on one side of the body, drooping on one side of the face, difficulty talking).   Worsening weakness, difficulty standing, paralysis, loss of control of the bladder or bowels or difficulty swallowing.              Cough    You have been seen for your cough.    There are many possible causes of cough. Most are not dangerous. Your doctor has determined that it is OK for you to go home today.    Antibiotics are not necessary for your cough at this time. If your cough  was caused by a virus, asthma, or lung irritation, then antibiotics will not help.    The doctor may have prescribed some medicine to help with your cough. Use the medicine as directed.    YOU SHOULD SEEK MEDICAL ATTENTION IMMEDIATELY, EITHER HERE OR AT THE NEAREST EMERGENCY DEPARTMENT, IF ANY OF THE FOLLOWING OCCURS:   You wheeze or have trouble breathing.   You cough up mucous or lose weight for no reason.   You have a fever (temperature higher than 100.6F / 38C) that lasts more than 5 days.   You have chest pain.   Your symptoms get worse or do not get better in 2 or 3 days.   You have any new problems or concerns.

## 2015-07-16 ENCOUNTER — Emergency Department: Payer: No Typology Code available for payment source

## 2015-07-16 ENCOUNTER — Emergency Department
Admission: EM | Admit: 2015-07-16 | Discharge: 2015-07-16 | Disposition: A | Discharge status: Home / Self Care | Payer: No Typology Code available for payment source | Attending: Emergency Medicine | Admitting: Emergency Medicine

## 2015-07-16 DIAGNOSIS — G35 Multiple sclerosis: Secondary | ICD-10-CM | POA: Insufficient documentation

## 2015-07-16 DIAGNOSIS — J209 Acute bronchitis, unspecified: Secondary | ICD-10-CM | POA: Insufficient documentation

## 2015-07-16 LAB — CBC AND DIFFERENTIAL
Basophils Absolute Automated: 0.02 10*3/uL (ref 0.00–0.20)
Basophils Automated: 0 %
Eosinophils Absolute Automated: 0.26 10*3/uL (ref 0.00–0.70)
Eosinophils Automated: 6 %
Hematocrit: 32.2 % — ABNORMAL LOW (ref 37.0–47.0)
Hgb: 10.5 g/dL — ABNORMAL LOW (ref 12.0–16.0)
Lymphocytes Absolute Automated: 0.91 10*3/uL (ref 0.50–4.40)
Lymphocytes Automated: 20 %
MCH: 27.9 pg — ABNORMAL LOW (ref 28.0–32.0)
MCHC: 32.6 g/dL (ref 32.0–36.0)
MCV: 85.6 fL (ref 80.0–100.0)
MPV: 11.3 fL (ref 9.4–12.3)
Monocytes Absolute Automated: 0.82 10*3/uL (ref 0.00–1.20)
Monocytes: 18 %
Neutrophils Absolute: 2.45 10*3/uL (ref 1.80–8.10)
Neutrophils: 55 %
Platelets: 256 10*3/uL (ref 140–400)
RBC: 3.76 10*6/uL — ABNORMAL LOW (ref 4.20–5.40)
RDW: 15 % (ref 12–15)
WBC: 4.46 10*3/uL (ref 3.50–10.80)

## 2015-07-16 LAB — COMPREHENSIVE METABOLIC PANEL
ALT: 11 U/L (ref 0–55)
AST (SGOT): 21 U/L (ref 5–34)
Albumin/Globulin Ratio: 1 (ref 0.9–2.2)
Albumin: 3.8 g/dL (ref 3.5–5.0)
Alkaline Phosphatase: 46 U/L (ref 37–106)
Anion Gap: 10 (ref 5.0–15.0)
BUN: 6 mg/dL — ABNORMAL LOW (ref 7.0–19.0)
Bilirubin, Total: 0.3 mg/dL (ref 0.2–1.2)
CO2: 23 mEq/L (ref 22–29)
Calcium: 8.7 mg/dL (ref 8.5–10.5)
Chloride: 102 mEq/L (ref 100–111)
Creatinine: 0.8 mg/dL (ref 0.6–1.0)
Globulin: 3.7 g/dL — ABNORMAL HIGH (ref 2.0–3.6)
Glucose: 90 mg/dL (ref 70–100)
Potassium: 4.1 mEq/L (ref 3.5–5.1)
Protein, Total: 7.5 g/dL (ref 6.0–8.3)
Sodium: 135 mEq/L — ABNORMAL LOW (ref 136–145)

## 2015-07-16 LAB — HCG, SERUM, QUALITATIVE: Hcg Qualitative: NEGATIVE

## 2015-07-16 LAB — GFR: EGFR: >60

## 2015-07-16 MED ORDER — IPRATROPIUM BROMIDE 0.02 % IN SOLN
0.5000 mg | Freq: Once | RESPIRATORY_TRACT | Status: AC
Start: 2015-07-16 — End: 2015-07-16
  Administered 2015-07-16: 0.5 mg via RESPIRATORY_TRACT
  Filled 2015-07-16: qty 2.5

## 2015-07-16 MED ORDER — PREDNISONE 20 MG PO TABS
60.0000 mg | ORAL_TABLET | Freq: Every day | ORAL | Status: AC
Start: 2015-07-17 — End: 2015-07-22

## 2015-07-16 MED ORDER — SODIUM CHLORIDE 0.9 % IV BOLUS
1000.0000 mL | Freq: Once | INTRAVENOUS | Status: AC
Start: 2015-07-16 — End: 2015-07-16
  Administered 2015-07-16: 1000 mL via INTRAVENOUS

## 2015-07-16 MED ORDER — IPRATROPIUM BROMIDE 0.02 % IN SOLN
500.0000 ug | Freq: Three times a day (TID) | RESPIRATORY_TRACT | Status: DC
Start: 2015-07-16 — End: 2016-01-16

## 2015-07-16 MED ORDER — KETOROLAC TROMETHAMINE 30 MG/ML IJ SOLN
30.0000 mg | Freq: Once | INTRAMUSCULAR | Status: AC
Start: 2015-07-16 — End: 2015-07-16
  Administered 2015-07-16: 30 mg via INTRAVENOUS
  Filled 2015-07-16: qty 1

## 2015-07-16 MED ORDER — AZITHROMYCIN 250 MG PO TABS
500.0000 mg | ORAL_TABLET | Freq: Once | ORAL | Status: AC
Start: 2015-07-16 — End: 2015-07-16
  Administered 2015-07-16: 500 mg via ORAL
  Filled 2015-07-16: qty 2

## 2015-07-16 MED ORDER — ALBUTEROL SULFATE (2.5 MG/3ML) 0.083% IN NEBU
2.5000 mg | INHALATION_SOLUTION | Freq: Once | RESPIRATORY_TRACT | Status: AC
Start: 2015-07-16 — End: 2015-07-16
  Administered 2015-07-16: 2.5 mg via RESPIRATORY_TRACT
  Filled 2015-07-16: qty 3

## 2015-07-16 MED ORDER — AZITHROMYCIN 250 MG PO TABS
250.0000 mg | ORAL_TABLET | Freq: Every day | ORAL | Status: AC
Start: 2015-07-17 — End: 2015-07-21

## 2015-07-16 MED ORDER — IBUPROFEN 800 MG PO TABS
800.0000 mg | ORAL_TABLET | Freq: Three times a day (TID) | ORAL | Status: DC | PRN
Start: 2015-07-16 — End: 2016-01-16

## 2015-07-16 MED ORDER — ACETAMINOPHEN 500 MG PO TABS
1000.0000 mg | ORAL_TABLET | Freq: Once | ORAL | Status: AC
Start: 2015-07-16 — End: 2015-07-16
  Administered 2015-07-16: 1000 mg via ORAL
  Filled 2015-07-16: qty 2

## 2015-07-16 MED ORDER — METHYLPREDNISOLONE SODIUM SUCC 125 MG IJ SOLR
125.0000 mg | Freq: Once | INTRAMUSCULAR | Status: AC
Start: 2015-07-16 — End: 2015-07-16
  Administered 2015-07-16: 125 mg via INTRAVENOUS
  Filled 2015-07-16: qty 2

## 2015-07-16 NOTE — Discharge Instructions (Signed)
Bronchitis    You have been diagnosed with bronchitis.    Bronchitis is an irritation of the large breathing tubes. It can be caused by tobacco smoke, air pollution, or an infection. Patients with bronchitis are short of breath and may cough up green or yellow mucous. These symptoms are usually worse at night, when lying flat and in wet weather. Most people with bronchitis do not need antibiotics. If your doctor prescribes antibiotics, fill the prescription and take all the medicine according to the instructions.    Bronchitis is usually treated with medicine to help stop coughing. An inhaler with albuterol (Ventolin/Proventil) is sometimes used to help with cough. It is best to use the inhaler with a spacer to help the medicine reach your lungs. The doctor can prescribe a spacer.    Bronchitis is usually caused by a virus. Antibiotics do not kill viruses. In fact, antibiotics do not affect viruses in any way. In the past, some doctors prescribed antibiotics for people with bronchitis. We now know that antibiotics are not helpful for most bronchitis patients. Patients who might need antibiotics are those with lung problems that don't go away, like emphysema or COPD.    Your coughing and wheezing might last for 2 or 3 weeks! The symptoms should get better over this time period and not worse.    Your doctor prescribed an albuterol (Ventolin/Proventil) inhaler. Use it every four hours if you are wheezing, coughing, or short of breath. This will reduce symptoms.    Do not smoke. Research shows that smoking causes heart disease, cancer, and birth defects. Avoiding smoking will help your asthma. If you smoke, ask your doctor for ideas about how to stop.   If you do not smoke, avoid others who do.    YOU SHOULD SEEK MEDICAL ATTENTION IMMEDIATELY, EITHER HERE OR AT THE NEAREST EMERGENCY DEPARTMENT, IF ANY OF THE FOLLOWING OCCURS:   You wheeze or have trouble breathing.   You have a fever (temperature higher  than 100.4F / 38C), that won't go away.   You have chest pain.   You vomit or cannot keep liquids down or you feel weak or dizzy.   Your symptoms get worse over the next 2 or 3 days.              Bronchospasm    You have been seen for an episode of bronchospasm.    Bronchospasm happens with asthma. However, it seems this episode was caused by a different problem.    Bronchospasm is when the breathing tubes get irritated and spasm. The spasm traps air inside. This makes it hard to exhale (breathe out). There is some inflammation (swelling). This increases the amount of mucus normally produced by the breathing tubes. Some medicine treats the spasm. This includes bronchodilators like albuterol (Ventolin/Proventil). Others treat the inflammation. This includes steroids like prednisone (Prelone/Orapred).   Some symptoms are: Wheezing, trouble breathing and chest pain and tightness. Some patients do not wheeze, but may have a dry cough.    Things that can cause bronchospasm include: Tobacco smoke, air pollution, dust mites, pet dander and fumes from certain irritating chemicals. Also upper and lower respiratory tract infections (colds, bronchitis, pneumonia). Some people get bronchospasm when they exercise, especially in cold weather.    Avoid known irritants. Some of the most common ones are tobacco smoke, pollen, dust, mold, mildew, pets and perfumes. Do not smoke. If you have a pollen allergy, stay indoors when pollen counts are highest.      The doctor prescribed prednisone (Prelone/Orapred). This is a steroid. It is to help stop the bronchospasm. Use it as directed to help reduce symptoms.    YOU SHOULD SEEK MEDICAL ATTENTION IMMEDIATELY, EITHER HERE OR AT THE NEAREST EMERGENCY DEPARTMENT, IF ANY OF THE FOLLOWING OCCURS:   No improvement in 48 to 72 hours or symptoms get worse.   You are vomiting or can t keep medicines down. Dizziness, weakness or confusion.   Shortness of breath gets  worse.   Needing to use the inhaler more often than prescribed.   Chest pain.   Coughing up blood.   Fever (temperature higher than 100.4F / 38C)   You have trouble breathing.

## 2015-07-16 NOTE — ED Notes (Signed)
fever, sob x 3 days +wheezes +cough

## 2015-07-16 NOTE — ED Provider Notes (Signed)
Physician/Midlevel provider first contact with patient: 07/16/15 2018         History     Chief Complaint   Patient presents with   . Fever   . Cough     Patient is a 40 y.o. female presenting with cough. The history is provided by the patient.   Cough  Cough characteristics:  Productive  Sputum characteristics:  Yellow  Severity:  Moderate  Onset quality:  Gradual  Duration:  3 days  Timing:  Constant  Progression:  Worsening  Chronicity:  New  Smoker: no    Context: upper respiratory infection    Relieved by:  Nothing  Worsened by:  Nothing tried  Ineffective treatments:  Beta-agonist inhaler and cough suppressants  Associated symptoms: chest pain (pressure), fever, headaches, myalgias, shortness of breath, sinus congestion and wheezing    Associated symptoms: no ear pain, no rash, no rhinorrhea and no sore throat    Risk factors comment:  +sick contacts       Nursing (triage) note reviewed for the following pertinent information:  fever, sob x 3 days +wheezes +cough    Past Medical History   Diagnosis Date   . MS (multiple sclerosis)    . Pneumonia 2011, 2012   . Sepsis 2011   . Asthma        Past Surgical History   Procedure Laterality Date   . Cesarean section     . Cholecystectomy     . Debridement & irrigation, abscess  2015       Family History   Problem Relation Age of Onset   . Cancer Father    . Diabetes Maternal Aunt    . Cancer Maternal Aunt        Social  Social History   Substance Use Topics   . Smoking status: Never Smoker    . Smokeless tobacco: Never Used   . Alcohol Use: Yes      Comment: socially       .     Allergies   Allergen Reactions   . Penicillins Hives       Home Medications     Last Medication Reconciliation Action:  In Progress Jetty Duhamel, RN 07/16/2015  8:12 PM                  acetaminophen (TYLENOL) 325 MG tablet     Take 650 mg by mouth.     albuterol (PROVENTIL) (2.5 MG/3ML) 0.083% nebulizer solution     Take 3 mLs (2.5 mg total) by nebulization every 4 (four) hours as  needed for Wheezing or Shortness of Breath (coughing).     AUBAGIO 7 MG Tab          baclofen (LIORESAL) 10 MG tablet     Take 10 mg by mouth 3 (three) times daily.     carisoprodol (SOMA) 350 MG tablet     TK 1 T PO  BID     Cholecalciferol (VITAMIN D) 1000 UNIT tablet     Take 1,000 Units by mouth daily.     clonazePAM (KLONOPIN) 0.25 MG disintegrating tablet     DIS ONE T PO  BID     DULERA 200-5 MCG/ACT Aerosol     INL 2 PFS PO BID     famotidine (PEPCID) 40 MG tablet     TK 1 T PO QD HS     fluticasone (FLONASE) 50 MCG/ACT nasal spray     USE 2  SPRAYS NASALLY ONCE D     levocetirizine (XYZAL) 5 MG tablet     TK 1 T PO QD     lidocaine (LIDODERM) 5 %     APPLY 2-3 PATCHES 12 HOURS ON AND 12 HOURS OFF     LYRICA 50 MG capsule     TK ONE C PO  BID     meloxicam (MOBIC) 7.5 MG tablet          PATADAY 0.2 % Solution          Spacer/Aero-Holding Chambers (OPTICHAMBER DIAMOND-LG MASK) Device     U UTD     tolterodine (DETROL) 2 MG tablet     TK 1 T PO  ONE TO TWO TIMES A DAY FOR OVERACTIVE BLADDER.     topiramate (TOPAMAX) 50 MG tablet     TK 1 T PO  BID           Review of Systems   Constitutional: Positive for fever, activity change, appetite change and fatigue.   HENT: Negative for ear pain, rhinorrhea and sore throat.    Respiratory: Positive for cough, shortness of breath and wheezing.    Cardiovascular: Positive for chest pain (pressure).   Gastrointestinal: Positive for nausea. Negative for abdominal pain.   Genitourinary: Negative for decreased urine volume.   Musculoskeletal: Positive for myalgias.   Skin: Negative for rash.   Neurological: Positive for headaches. Negative for light-headedness.   All other systems reviewed and are negative.      Physical Exam    BP: (!) 157/93 mmHg, Heart Rate: 97, Temp: (!) 100.7 F (38.2 C), Resp Rate: 20, SpO2: 98 %, Weight: 83.462 kg    Physical Exam   Constitutional: She is oriented to person, place, and time. Vital signs are normal. She appears well-developed and  well-nourished. No distress.   HENT:   Head: Normocephalic and atraumatic.   Right Ear: Tympanic membrane, external ear and ear canal normal.   Left Ear: Tympanic membrane, external ear and ear canal normal.   Nose: Mucosal edema and rhinorrhea present. Right sinus exhibits no maxillary sinus tenderness and no frontal sinus tenderness. Left sinus exhibits no maxillary sinus tenderness and no frontal sinus tenderness.   Mouth/Throat: Uvula is midline and oropharynx is clear and moist. No oropharyngeal exudate, posterior oropharyngeal edema or posterior oropharyngeal erythema.   Eyes: Conjunctivae and EOM are normal.   Neck: Normal range of motion. Neck supple.   Cardiovascular: Normal rate, regular rhythm, normal heart sounds and intact distal pulses.    No murmur heard.  Pulmonary/Chest: Effort normal. No respiratory distress. She has wheezes.   Abdominal: Soft. Bowel sounds are normal. There is no tenderness. There is no rebound and no guarding.   Musculoskeletal: Normal range of motion. She exhibits no edema or tenderness.   Lymphadenopathy:     She has cervical adenopathy.   Neurological: She is alert and oriented to person, place, and time. She exhibits normal muscle tone. GCS eye subscore is 4. GCS verbal subscore is 5. GCS motor subscore is 6.   Skin: Skin is warm and dry. No rash noted. No pallor.   Psychiatric: She has a normal mood and affect. Her behavior is normal.   Nursing note and vitals reviewed.        MDM and ED Course     ED Medication Orders     Start Ordered     Status Ordering Provider    07/16/15 2130 07/16/15 2127  albuterol (  PROVENTIL) nebulizer solution 2.5 mg   RT - Once     Route: Nebulization  Ordered Dose: 2.5 mg     Last MAR action:  Given Natale Barba A    07/16/15 2130 07/16/15 2127  ipratropium (ATROVENT) 0.02 % nebulizer solution 0.5 mg   RT - Once     Route: Nebulization  Ordered Dose: 0.5 mg     Last MAR action:  Given Aren Cherne A    07/16/15 2130 07/16/15 2127  acetaminophen  (TYLENOL) tablet 1,000 mg   Once     Route: Oral  Ordered Dose: 1,000 mg     Last MAR action:  Given Ravan Schlemmer A    07/16/15 2030 07/16/15 2018  albuterol (PROVENTIL) nebulizer solution 2.5 mg   RT - Once     Route: Nebulization  Ordered Dose: 2.5 mg     Last MAR action:  Given Shameca Landen A    07/16/15 2030 07/16/15 2018  ipratropium (ATROVENT) 0.02 % nebulizer solution 0.5 mg   RT - Once     Route: Nebulization  Ordered Dose: 0.5 mg     Last MAR action:  Given Wyona Neils A    07/16/15 2030 07/16/15 2025  ketorolac (TORADOL) injection 30 mg   Once     Route: Intravenous  Ordered Dose: 30 mg     Last MAR action:  Given John Vasconcelos A    07/16/15 2030 07/16/15 2025  sodium chloride 0.9 % bolus 1,000 mL   Once     Route: Intravenous  Ordered Dose: 1,000 mL     Last MAR action:  New Bag Terri Malerba A    07/16/15 2030 07/16/15 2025  methylPREDNISolone sodium succinate (Solu-MEDROL) injection 125 mg   Once     Route: Intravenous  Ordered Dose: 125 mg     Last MAR action:  Given Liani Caris A             MDM  Number of Diagnoses or Management Options  Bronchitis, acute, with bronchospasm:      Amount and/or Complexity of Data Reviewed  Clinical lab tests: ordered and reviewed  Tests in the radiology section of CPT: ordered and reviewed  Independent visualization of images, tracings, or specimens: yes        I, Kalman Drape MD, have been the primary provider for Lindee M Antonini during this Emergency Dept visit.    Oxygen saturation by pulse oximetry is 95%-100% on RA, Normal.  Interventions: Patient Observed.    Will check labs  Including flu, hydrate pt, give IV Toradol, and eval by CXR to r/o pneumonia.  Nebs and steroids to treat bronchspasm.    9:15pm  Pt rechecked and reports feeling better.  Insp wheezes resolved but still with diffuse exp wheezes.  Will repeat neb and recheck.  CXR pending.    Pt still with some wheezing but feeling much better and has neb at home.  Will add Atrovent solution, as well as  abx for possible bronchitis.        Results for orders placed or performed during the hospital encounter of 07/16/15   Rapid influenza A/B antigens    Narrative    ORDER#: [id number]                                    ORDERED BY: Angelique Blonder  SOURCE: Nasal Aspirate  COLLECTED:  07/16/15 20:37  ANTIBIOTICS AT COLL.:                                RECEIVED :  07/16/15 20:45  Influenza Rapid Antigen A&B                FINAL       07/16/15 20:58  07/16/15   Negative for Influenza A and B             Reference Range: Negative     CBC with differential   Result Value Ref Range    WBC 4.46 3.50 - 10.80 x10 3/uL    Hgb 10.5 (L) 12.0 - 16.0 g/dL    Hematocrit 16.1 (L) 37.0 - 47.0 %    Platelets 256 140 - 400 x10 3/uL    RBC 3.76 (L) 4.20 - 5.40 x10 6/uL    MCV 85.6 80.0 - 100.0 fL    MCH 27.9 (L) 28.0 - 32.0 pg    MCHC 32.6 32.0 - 36.0 g/dL    RDW 15 12 - 15 %    MPV 11.3 9.4 - 12.3 fL    Neutrophils 55 None %    Lymphocytes Automated 20 None %    Monocytes 18 None %    Eosinophils Automated 6 None %    Basophils Automated 0 None %    Immature Granulocyte Unmeasured None %    Nucleated RBC Unmeasured 0 - 1 /100 WBC    Neutrophils Absolute 2.45 1.80 - 8.10 x10 3/uL    Abs Lymph Automated 0.91 0.50 - 4.40 x10 3/uL    Abs Mono Automated 0.82 0.00 - 1.20 x10 3/uL    Abs Eos Automated 0.26 0.00 - 0.70 x10 3/uL    Absolute Baso Automated 0.02 0.00 - 0.20 x10 3/uL    Absolute Immature Granulocyte Unmeasured 0 x10 3/uL   Comprehensive metabolic panel   Result Value Ref Range    Glucose 90 70 - 100 mg/dL    BUN 6.0 (L) 7.0 - 09.6 mg/dL    Creatinine 0.8 0.6 - 1.0 mg/dL    Sodium 045 (L) 409 - 145 mEq/L    Potassium 4.1 3.5 - 5.1 mEq/L    Chloride 102 100 - 111 mEq/L    CO2 23 22 - 29 mEq/L    Calcium 8.7 8.5 - 10.5 mg/dL    Protein, Total 7.5 6.0 - 8.3 g/dL    Albumin 3.8 3.5 - 5.0 g/dL    AST (SGOT) 21 5 - 34 U/L    ALT 11 0 - 55 U/L    Alkaline Phosphatase 46 37 - 106 U/L    Bilirubin, Total 0.3 0.2 -  1.2 mg/dL    Globulin 3.7 (H) 2.0 - 3.6 g/dL    Albumin/Globulin Ratio 1.0 0.9 - 2.2    Anion Gap 10.0 5.0 - 15.0   hCG, Qualitative (Pos/Neg)   Result Value Ref Range    Hcg Qualitative Negative Negative   GFR   Result Value Ref Range    EGFR >60.0      Results for orders placed or performed during the hospital encounter of 07/16/15   XR Chest 2 Views    Narrative    HISTORY:  Cough, fever and wheezing     COMPARISON:  06/26/2015    TWO-VIEW CHEST: The heart is normal in size.  There is normal pulmonary  vascularity.  Lungs are clear.  No pleural effusion, hilar or  mediastinal prominence is evident. No pneumothorax is seen. Mild  degenerative changes are noted in the spine. There is evidence of prior  cholecystectomy.      Impression     No acute cardiopulmonary disease.    Geanie Cooley, MD   07/16/2015 9:54 PM             Procedures    Clinical Impression & Disposition     Clinical Impression  Final diagnoses:   Bronchitis, acute, with bronchospasm        ED Disposition     Discharge Iman M Fischman discharge to home/self care.    Condition at disposition: Stable             Discharge Medication List as of 07/16/2015 10:25 PM      START taking these medications    Details   azithromycin (ZITHROMAX) 250 MG tablet Take 1 tablet (250 mg total) by mouth daily., Starting 07/17/2015, Until Wed 07/21/15, Print      ibuprofen (ADVIL,MOTRIN) 800 MG tablet Take 1 tablet (800 mg total) by mouth every 8 (eight) hours as needed for Pain or Fever., Starting 07/16/2015, Until Discontinued, Print      ipratropium (ATROVENT) 0.02 % nebulizer solution Take 2.5 mLs (0.5 mg total) by nebulization 3 (three) times daily., Starting 07/16/2015, Until Discontinued, Print      predniSONE (DELTASONE) 20 MG tablet Take 3 tablets (60 mg total) by mouth daily., Starting 07/17/2015, Until Thu 07/22/15, Print                         Kalman Drape, MD  07/20/15 2132

## 2015-08-12 ENCOUNTER — Telehealth (INDEPENDENT_AMBULATORY_CARE_PROVIDER_SITE_OTHER): Payer: Self-pay | Admitting: Internal Medicine

## 2015-08-12 NOTE — Telephone Encounter (Signed)
Please call the patient and ask how she is doing.  She was seen in the ED on 07/16/2015 and has not followed up.  Please advise the patient to make an appointment to be seen ASAP and advise of the option of Urgent Care or our extended hours for future problems.  Is she due for a physical?  Please update her Overdue Health Maintenance list.

## 2015-09-09 ENCOUNTER — Other Ambulatory Visit (INDEPENDENT_AMBULATORY_CARE_PROVIDER_SITE_OTHER): Payer: Self-pay | Admitting: Surgery

## 2015-09-09 ENCOUNTER — Ambulatory Visit (INDEPENDENT_AMBULATORY_CARE_PROVIDER_SITE_OTHER): Payer: No Typology Code available for payment source | Admitting: Surgery

## 2015-09-09 VITALS — BP 125/80 | HR 81 | Wt 184.0 lb

## 2015-09-09 DIAGNOSIS — M79605 Pain in left leg: Secondary | ICD-10-CM

## 2015-09-09 NOTE — Progress Notes (Signed)
CONSULTATION  Vascular and Endovascular Surgery      Date Time: @IPTODAYDATE @ 11:59 AM  Referring Physician:   Consulting Physician: Dione Plover MD  Consulting Service: Vascular and Endovascular Surgery    REASON FOR CONSULTATION:  Left thigh pressure pain when sitting.    HISTORY OF PRESENT ILLNESS:  Judy Freeman is a 40 y.o. female presents for evaluation of left thigh pressure pain for two months. Patient has Multiple Sclerosis. Pain occurs almost always with sitting or sitting while driving.  She denies any claudication symptoms.  No tissue loss or ulceration.  Patient was recently diagnosed with a herniated disc.  She denies having radiating by buttock and posterior leg pain.  Due to her multiple sclerosis.  The patient has chronic soft tissue pain.    Past Medical History   Diagnosis Date   . MS (multiple sclerosis)    . Pneumonia 2011, 2012   . Sepsis 2011   . Asthma        Past Surgical History   Procedure Laterality Date   . Cesarean section     . Cholecystectomy     . Debridement & irrigation, abscess  2015       Family History   Problem Relation Age of Onset   . Cancer Father    . Diabetes Maternal Aunt    . Cancer Maternal Aunt        @IPSOCHX @    Allergies   Allergen Reactions   . Penicillins Hives       @IPPTAMEDS @    REVIEW OF SYSTEMS:  A comprehensive twelve point review of systems was: Negative in the neurologic, respiratory, cardiac, vascular, gastrointestinal, genitourinary, musculoskeletal, immunologic,psychiatric or endocrinologic systems except as mentioned above.       PHYSICAL EXAMINATION:    There were no vitals filed for this visit.    General: Patient appears their stated age, well-nourished. Alert and in no apparent distress. Speaks in full sentences    Head: Normal cranium and atraumatic.    Eyes: EOMI Bilaterally, PERRLA Bilaterally    Nose: No septal defects. No nasal discharge    Ears: Normal externals ears. Normal gross hearing    Throat: Oropharynx: Normal      Tongue FROM:  Normal    Neck: Supple: Yes   Adenopathy: No   Trachea Midline: Yes   JVD: No    CVS:  Rate: Normal   Rhythm: Regular   Murmurs: No   Palpitation: No    PUL: CTA, Bilaterally; No Wheezes, Rhonchi, or Rales    ABD: Flat and Thin; Soft, Non-tender, Non-distended, NABS   Guarding or Rebound: No   Mid-line Pulsatile Mass:  No    Skin: Color appropriate for race, Skin warm and dry: Yes.   Gangrene: No   Non-healing ulcers: No   Varicose veins: No,    Hyperpigmentation: No   Lipo-dermatosclerosis: No    Neuro: CN II-XII intact, and sensory intact    Psych: AAOx3, appropriate mood and affect.      Vascular Exam:    Radial (Right): Palpable  Radial (Left): Palpable    Ulnar (Right): Palpable  Ulnar (Left): Palpable    Abd: No pulsatile abdominal mass    Fem (Right): Palpable , Bruit  No  Fem (Left):  Palpable , Bruit  No    Pop (Right):  Palpable , Bruit  No  Pop (Left):  Palpable , Bruit  No    DP (Right): Palpable  DP (Left): Palpable  PT (Right): Palpable  PT (Left): Palpable    Motor Strength:    Hip Flexion (Right): Normal  Hip Flexion (Left): Normal    Knee Extension (Right): Normal  Knee Extension (Left): Normal    Plantar Flexion (Right): Normal  Plantar Flexion (Left): Normal    Dorsiflexion (Right): Normal  Dorsiflexion (Left): Normal    Other Exam Findings:   Manual palpation of the left anterior, posterior, lateral and medial thigh demonstrated no evidence of soft tissue masses.  She has a palpable left popliteal artery.  Manual pressure over the left popliteal artery and left popliteal fossa demonstrates some mild tenderness.  Patient does not have any posterior buttock tenderness.      LABORATORY AND IMAGING STUDIES:    Results     ** No results found for the last 24 hours. **          Radiology:  No results found.        Patient Active Problem List   Diagnosis   . Anxiety       ASSESSMENT:  40 y.o. female the herniated disc, multiple sclerosis, who presents with isolated left posterior thigh and left  popliteal fossa tenderness.  The patient has a normal pulse exam.  However, she does have tenderness over the left popliteal artery.  At this time, I am uncertain as to the etiology of her left posterior thigh pain.  I will obtain imaging to evaluate her symptoms.    PLAN:  1. CTA/Lower Extremities to evaluated for possible etiology of the left posterior thigh pain.    2. Right leg arterial duplex to evaluate the arteries in her leg, especially the left popliteal artery.    3. Follow-up in the Vascular Surgery Clinic after above studies.      Thank you for the consult.      Dione Plover, MD, FACS  Vascular and Endovascular Surgery  [facility]  [facility]  [phone]

## 2015-09-13 ENCOUNTER — Other Ambulatory Visit (INDEPENDENT_AMBULATORY_CARE_PROVIDER_SITE_OTHER): Payer: Self-pay

## 2015-09-15 ENCOUNTER — Ambulatory Visit: Payer: No Typology Code available for payment source

## 2015-09-16 ENCOUNTER — Ambulatory Visit
Admission: RE | Admit: 2015-09-16 | Discharge: 2015-09-16 | Disposition: A | Discharge status: Home / Self Care | Payer: No Typology Code available for payment source | Source: Ambulatory Visit | Attending: Surgery | Admitting: Surgery

## 2015-09-16 DIAGNOSIS — Z87891 Personal history of nicotine dependence: Secondary | ICD-10-CM | POA: Insufficient documentation

## 2015-09-16 DIAGNOSIS — M79605 Pain in left leg: Secondary | ICD-10-CM | POA: Insufficient documentation

## 2015-09-22 ENCOUNTER — Ambulatory Visit: Admission: RE | Admit: 2015-09-22 | Payer: No Typology Code available for payment source | Source: Ambulatory Visit

## 2015-09-22 ENCOUNTER — Encounter (INDEPENDENT_AMBULATORY_CARE_PROVIDER_SITE_OTHER): Payer: Self-pay | Admitting: Surgery

## 2015-09-27 ENCOUNTER — Encounter (INDEPENDENT_AMBULATORY_CARE_PROVIDER_SITE_OTHER): Payer: Self-pay | Admitting: Surgery

## 2015-11-12 ENCOUNTER — Other Ambulatory Visit (INDEPENDENT_AMBULATORY_CARE_PROVIDER_SITE_OTHER): Payer: Self-pay | Admitting: Surgery

## 2015-11-12 DIAGNOSIS — M79662 Pain in left lower leg: Secondary | ICD-10-CM

## 2015-11-18 ENCOUNTER — Ambulatory Visit
Admission: RE | Admit: 2015-11-18 | Discharge: 2015-11-18 | Disposition: A | Discharge status: Home / Self Care | Payer: No Typology Code available for payment source | Source: Ambulatory Visit | Attending: Surgery | Admitting: Surgery

## 2015-11-18 DIAGNOSIS — M79662 Pain in left lower leg: Secondary | ICD-10-CM

## 2015-11-18 DIAGNOSIS — M79661 Pain in right lower leg: Secondary | ICD-10-CM

## 2016-01-16 ENCOUNTER — Emergency Department: Payer: No Typology Code available for payment source

## 2016-01-16 ENCOUNTER — Emergency Department
Admission: EM | Admit: 2016-01-16 | Discharge: 2016-01-16 | Disposition: A | Discharge status: Home / Self Care | Payer: No Typology Code available for payment source | Attending: Emergency Medicine | Admitting: Emergency Medicine

## 2016-01-16 DIAGNOSIS — J45909 Unspecified asthma, uncomplicated: Secondary | ICD-10-CM | POA: Insufficient documentation

## 2016-01-16 DIAGNOSIS — M778 Other enthesopathies, not elsewhere classified: Secondary | ICD-10-CM

## 2016-01-16 DIAGNOSIS — M779 Enthesopathy, unspecified: Secondary | ICD-10-CM | POA: Insufficient documentation

## 2016-01-16 DIAGNOSIS — G35 Multiple sclerosis: Secondary | ICD-10-CM | POA: Insufficient documentation

## 2016-01-16 MED ORDER — TRAMADOL HCL 50 MG PO TABS
50.0000 mg | ORAL_TABLET | Freq: Four times a day (QID) | ORAL | 0 refills | Status: DC | PRN
Start: 2016-01-16 — End: 2016-09-23

## 2016-01-16 MED ORDER — IBUPROFEN 800 MG PO TABS
800.0000 mg | ORAL_TABLET | Freq: Three times a day (TID) | ORAL | 0 refills | Status: DC | PRN
Start: 2016-01-16 — End: 2016-09-23

## 2016-01-16 MED ORDER — KETOROLAC TROMETHAMINE 30 MG/ML IJ SOLN
60.0000 mg | Freq: Once | INTRAMUSCULAR | Status: AC
Start: 2016-01-16 — End: 2016-01-16
  Administered 2016-01-16: 60 mg via INTRAMUSCULAR
  Filled 2016-01-16: qty 2

## 2016-01-16 NOTE — Discharge Instructions (Signed)
Tendonitis    You have been diagnosed with tendonitis.    Tendons are strong bands of connective tissue that attach muscles to bone. "Itis" means inflammation. Tendonitis is when the tendon and its coverings get irritated and inflamed (swollen). The cause is often, but not always, using one muscle group over and over and too much.    Tendonitis is treated with pain medicine. It is sometimes treated with a splint. This keeps the injured tendon from moving. Additional treatments include Resting, Icing, Compressing, and Elevating the injured area. Remember this as "RICE."   REST: Limit the use of the injured body part.   ICE: By applying ice to the affected area, swelling and pain can be reduced. Place some ice cubes in a re-sealable (Ziploc) bag and add some water. Put a thin washcloth between the bag and the skin. Apply the ice bag to the area for at least 20 minutes. Do this at least 4 times per day. Using the ice for longer times and more frequently is OK. NEVER APPLY ICE DIRECTLY TO THE SKIN.   COMPRESS: Compression means to apply pressure around the injured area such as with a splint, cast or an ACE bandage. Compression decreases swelling and improves comfort. Compression should be tight enough to relieve swelling but not so tight as to decrease circulation. Increasing pain, numbness, tingling, or change in skin color, are all signs of decreased circulation.   ELEVATE: Elevate the injured part. For example, a painful arm can be elevated by placing the arm in a sling while awake and propped up on pillows while lying down.    YOU SHOULD SEEK MEDICAL ATTENTION IMMEDIATELY, EITHER HERE OR AT THE NEAREST EMERGENCY DEPARTMENT, IF ANY OF THE FOLLOWING OCCURS:   Severe increase in pain or swelling in the injured area.   New numbness or tingling in or below the injured area.   A cold, pale foot/hand that seems to have blood supply problems develops.   A fever (temperature higher than 100.4F / 38C), chills  or increased redness over the affected area develops.

## 2016-01-16 NOTE — ED Triage Notes (Signed)
patient has MS and woke up wednesday unable to move left wrist, no grip strength. no relief with ibuprofen, tylenol, or lido patch.

## 2016-01-17 ENCOUNTER — Telehealth (INDEPENDENT_AMBULATORY_CARE_PROVIDER_SITE_OTHER): Payer: Self-pay

## 2016-01-17 NOTE — Telephone Encounter (Signed)
Left voice message to call to schedule ER Follow UP.

## 2016-01-23 NOTE — ED Provider Notes (Signed)
Physician/Midlevel provider first contact with patient: 01/16/16 1455         History     Chief Complaint   Patient presents with   . Wrist Pain     The history is provided by the patient.   Wrist Pain   This is a new problem. The current episode started in the past 7 days. The problem occurs constantly. The problem has been gradually worsening. Associated symptoms include arthralgias (L wrist). Pertinent negatives include no chest pain, fatigue, fever, joint swelling, myalgias, numbness, rash or weakness. The symptoms are aggravated by bending (lifting). She has tried acetaminophen, NSAIDs and rest (lidoderm patch) for the symptoms.   Pt w/ hx of MS and is concerned that pain is an exacerbation/ complication of her MS.  She denies any distal numbness, weakness in fingers or at elbow.         Past Medical History:   Diagnosis Date   . Asthma    . MS (multiple sclerosis)    . Pneumonia 2011, 2012   . Sepsis 2011       Past Surgical History:   Procedure Laterality Date   . CESAREAN SECTION     . CHOLECYSTECTOMY     . DEBRIDEMENT & IRRIGATION, ABSCESS  2015       Family History   Problem Relation Age of Onset   . Cancer Father    . Diabetes Maternal Aunt    . Cancer Maternal Aunt        Social  Social History   Substance Use Topics   . Smoking status: Never Smoker   . Smokeless tobacco: Never Used   . Alcohol use Yes      Comment: occasionally       .     Allergies   Allergen Reactions   . Penicillins Hives       Home Medications     Med List Status:  In Progress Set By: Ivar Drape, RN at 01/16/2016  2:45 PM                carisoprodol (SOMA) 350 MG tablet     TK 1 T PO  BID     lidocaine (LIDODERM) 5 %     APPLY 2-3 PATCHES 12 HOURS ON AND 12 HOURS OFF           Review of Systems   Constitutional: Positive for activity change. Negative for fatigue and fever.   Cardiovascular: Negative for chest pain.   Musculoskeletal: Positive for arthralgias (L wrist). Negative for joint swelling and myalgias.   Skin: Negative  for rash.   Neurological: Negative for weakness and numbness.   All other systems reviewed and are negative.      Physical Exam    BP: 129/84, Heart Rate: 77, Temp: 98.3 F (36.8 C), Resp Rate: 18, SpO2: 100 %, Weight: 83.9 kg    Physical Exam   Constitutional: She is oriented to person, place, and time. Vital signs are normal. She appears well-developed and well-nourished. She appears distressed (not using L wrist hand unless supported by other hand).   HENT:   Head: Normocephalic and atraumatic.   Eyes: Conjunctivae and EOM are normal.   Neck: Normal range of motion. Neck supple.   Cardiovascular: Normal rate, regular rhythm and intact distal pulses.    Pulmonary/Chest: Effort normal. No respiratory distress.   Musculoskeletal:        Left wrist: She exhibits decreased range of motion and  tenderness (ulnar aspect, along tendon and extending into prox muscle wad.  No induration or erythema.). She exhibits no swelling and no effusion.        Arms:  Neurological: She is alert and oriented to person, place, and time. She exhibits normal muscle tone. GCS eye subscore is 4. GCS verbal subscore is 5. GCS motor subscore is 6.   Skin: Skin is warm and dry. Capillary refill takes less than 2 seconds. No rash noted.   Psychiatric: She has a normal mood and affect. Her behavior is normal.   Nursing note and vitals reviewed.        MDM and ED Course     ED Medication Orders     Start Ordered     Status Ordering Provider    01/16/16 1600 01/16/16 1547  ketorolac (TORADOL) injection 60 mg  Once     Route: Intramuscular  Ordered Dose: 60 mg     Last MAR action:  Given Briaunna Grindstaff A             MDM  Number of Diagnoses or Management Options  Left wrist tendonitis:      Amount and/or Complexity of Data Reviewed  Tests in the radiology section of CPT: ordered and reviewed  Independent visualization of images, tracings, or specimens: yes    I, Kalman Drape MD, have been the primary provider for Judy Freeman during this  Emergency Dept visit.    Oxygen saturation by pulse oximetry is 95%-100% on RA, Normal.  Interventions: Patient Observed.        ED Course      Suspect tendonitis with tenderness isolated to one side of wrist joint and extending into proximal muscle wad.  No effusion or abnormality on XR.  No proximal or distal weakness or numbness that would support pain as an MS exacerbation.      Will give pt velcro wrist splint to rest joint and advised her to f/u w/ ortho if no improvement in next week.       Results for orders placed or performed during the hospital encounter of 01/16/16   Wrist Left PA Lateral and Oblique    Narrative    Clinical History: Left wrist pain.     Findings: PA, lateral, and oblique views of the left wrist.     No fracture, dislocation, or significant osteoarthritis is identified.      Impression     No evidence of fracture.    Darra Lis, MD   01/16/2016 4:40 PM           Procedures    Clinical Impression & Disposition     Clinical Impression  Final diagnoses:   Left wrist tendonitis        ED Disposition     ED Disposition Condition Date/Time Comment    Discharge  Sun Jan 16, 2016  4:52 PM Judy Freeman discharge to home/self care.    Condition at disposition: Stable           Discharge Medication List as of 01/16/2016  4:52 PM      START taking these medications    Details   ibuprofen (ADVIL,MOTRIN) 800 MG tablet Take 1 tablet (800 mg total) by mouth every 8 (eight) hours as needed for Pain., Starting Sun 01/16/2016, Normal      traMADol (ULTRAM) 50 MG tablet Take 1 tablet (50 mg total) by mouth every 6 (six) hours as needed for Pain.Do not drive  or operate machinery while taking this medication, Starting Sun 01/16/2016, Print                       Kalman Drape, MD  01/23/16 [phone]

## 2016-09-23 ENCOUNTER — Emergency Department
Admission: EM | Admit: 2016-09-23 | Discharge: 2016-09-23 | Disposition: A | Discharge status: Home / Self Care | Payer: Medicaid Other | Attending: Emergency Medicine | Admitting: Emergency Medicine

## 2016-09-23 ENCOUNTER — Emergency Department: Payer: Medicaid Other

## 2016-09-23 DIAGNOSIS — M79671 Pain in right foot: Secondary | ICD-10-CM | POA: Insufficient documentation

## 2016-09-23 DIAGNOSIS — M7989 Other specified soft tissue disorders: Secondary | ICD-10-CM | POA: Insufficient documentation

## 2016-09-23 MED ORDER — TRAMADOL HCL 50 MG PO TABS
50.0000 mg | ORAL_TABLET | Freq: Four times a day (QID) | ORAL | 0 refills | Status: DC | PRN
Start: 2016-09-23 — End: 2017-01-03

## 2016-09-23 MED ORDER — NAPROXEN 500 MG PO TABS
500.0000 mg | ORAL_TABLET | Freq: Two times a day (BID) | ORAL | 0 refills | Status: DC
Start: 2016-09-23 — End: 2017-01-03

## 2016-09-23 MED ORDER — IBUPROFEN 600 MG PO TABS
600.0000 mg | ORAL_TABLET | Freq: Once | ORAL | Status: AC
Start: 2016-09-23 — End: 2016-09-23
  Administered 2016-09-23: 600 mg via ORAL
  Filled 2016-09-23: qty 1

## 2016-09-23 NOTE — Discharge Instructions (Signed)
Joint Pain     You have been seen for joint pain.     Many things can cause pain in your joints. Having joint pain means that you often have inflammation in your joints.      Some things that can cause joint pain are:  · Rheumatoid arthritis (inflammation of the joints).  · Trauma or injury to your joints.  · A viral infection.  · Overuse of your joints.  · Obesity that causes excess wear and tear on your joints.  · Gout (swelling of the joints).  · Osteoarthritis (breakdown of cartilage in the joints).  · Inflammation of a joint tendon (tendinitis).     It is not yet known why you are having joint pains. You might need another exam or more tests to find out why you have these symptoms. At this time, the cause of your symptoms does not seem dangerous. You don't need to stay in the hospital.     Some things you can try at home are:  · Rest the affected joint.  · Frequent stretching.  · Massage.  · NSAID medications like ibuprofen (Advil® or Motrin®), naproxen (Aleve®, Naprosyn®).  · Elevating the joints that hurt.     Follow the instructions for any medication you are prescribed.      Follow up with your doctor in 7 days.     We don’t believe your condition is dangerous right now. However, you need to be careful. Sometimes a problem that seems small can get serious later. Therefore, it is very important for you to come back here or go to the nearest Emergency Department if you don't get better or your symptoms get worse.     YOU SHOULD SEEK MEDICAL ATTENTION IMMEDIATELY, EITHER HERE OR AT THE NEAREST EMERGENCY DEPARTMENT, IF ANY OF THE FOLLOWING OCCUR:     · You have a fever (temperature higher than 100.4°F or 38°C).  · Your pain does not go away or gets worse.  · You notice redness over the joint.  · Your painful joint gets very swollen.  · You don't feel better after treatment.  · Any other symptoms, concerns, or not getting better as expected.     If you can't follow up with your doctor, or if at any time you feel  you need to be rechecked or seen again, come back here or go to the nearest emergency department.

## 2016-09-23 NOTE — ED Provider Notes (Signed)
Physician/Midlevel provider first contact with patient: 09/23/16 0708         History     Chief Complaint   Patient presents with   . Foot Pain     HPI     Patient Name: Judy Freeman, Judy Freeman, 41 y.o., female      DR. Markia Kyer  is the primary attending for this patient and performed the HPI, PE, and medical decision making for this patient.    History obtained by: patient  CC/Onset/Duration/Quality/Location/Severity/Context: Patient presents with pain along the right foot along the sole last several weeks with no reported trauma.  Started noticing some mild nodular swelling to the aspect of the plantar area for the last 3 days.  Symptoms are constant, mild to moderate in nature.  Aggravating factors are : Walking. Alleviating factors are : Nothing taken  Associated Symptoms: Denies numbness, tingling, trauma, redness, proximal tenderness, calf pain, fever, chills.        *This note was generated by the Epic EMR system/ Dragon speech recognition and may contain inherent errors or omissions not intended by the user. Grammatical errors, random word insertions, deletions, pronoun errors and incomplete sentences are occasional consequences of this technology due to software limitations. Not all errors are caught or corrected. If there are questions or concerns about the content of this note or information contained within the body of this dictation they should be addressed directly with the author for clarification          Past Medical History:   Diagnosis Date   . Asthma    . MS (multiple sclerosis)    . Pneumonia 2011, 2012   . Sepsis 2011       Past Surgical History:   Procedure Laterality Date   . CESAREAN SECTION     . CHOLECYSTECTOMY     . DEBRIDEMENT & IRRIGATION, ABSCESS  2015       Family History   Problem Relation Age of Onset   . Cancer Father    . Diabetes Maternal Aunt    . Cancer Maternal Aunt        Social  Social History   Substance Use Topics   . Smoking status: Never Smoker   . Smokeless tobacco:  Never Used   . Alcohol use Yes      Comment: occasionally       .     Allergies   Allergen Reactions   . Penicillins Hives       Home Medications     Med List Status:  In Progress Set By: Jettie Booze, RN at 09/23/2016  7:11 AM                                                    Flagged for Removal             lidocaine (LIDODERM) 5 %     APPLY 2-3 PATCHES 12 HOURS ON AND 12 HOURS OFF           Review of Systems   Constitutional: Negative for chills and fever.   Musculoskeletal: Negative for back pain.   Skin: Negative for pallor and rash.   Neurological: Negative for weakness and numbness.   All other systems reviewed and are negative.      Physical Exam    BP: 144/85,  Heart Rate: 81, Temp: 97.9 F (36.6 C), Resp Rate: 18, SpO2: 99 %, Weight: 83.9 kg    Physical Exam   Constitutional: She is oriented to person, place, and time. She appears distressed.   HENT:   Head: Normocephalic and atraumatic.   Musculoskeletal: Normal range of motion. She exhibits tenderness.   Mild tenderness along the sole of her right foot with a small half centimeter nodular mobile soft tissue swelling appreciated along the mid right palmar surface of the right foot.  No significant erythema, fluctuance felt.  No evidence of infections present.  No dorsal tenderness is appreciated.Distal N/V exam normal of affected extremity, cap refill normal, pulses intact.   Neurological: She is alert and oriented to person, place, and time. She has normal strength. No sensory deficit.   Skin: Skin is warm and dry. No erythema.   Psychiatric: She has a normal mood and affect. Her behavior is normal.   Nursing note and vitals reviewed.        MDM and ED Course     ED Medication Orders     Start Ordered     Status Ordering Provider    09/23/16 0730 09/23/16 0728  ibuprofen (ADVIL,MOTRIN) tablet 600 mg  Once     Route: Oral  Ordered Dose: 600 mg     Last MAR action:  Given Antwain Caliendo             MDM  Number of Diagnoses or Management Options      Amount and/or Complexity of Data Reviewed  Tests in the radiology section of CPT: ordered and reviewed  Independent visualization of images, tracings, or specimens: yes               Images of xray independently visualized and interpreted by me - Dr. Helayne Seminole  Interpretation by ED physician : r foot xray - no acute fracture, no subcutaneous air,  negative for acute process.       Patient course and reassessment prior to discharge : op f/u with podiatry given ( several names given) and pain meds/post op shoe. Told to return for any worsening conditions.    Counseled pt about diagnosis and/or relevant lab/radiology results if any present. Recommended immediate ED visit for any new or worsening problems or concerns. Patient expressed understanding and agreement with discharge plan. Well appearing at discharge. Close and timed follow up recommended.    Results     ** No results found for the last 24 hours. **          Radiology Results (24 Hour)     Procedure Component Value Units Date/Time    Foot Right AP Lateral And Oblique [[id number]] Collected:  09/23/16 0740    Order Status:  Completed Updated:  09/23/16 0745    Narrative:       HISTORY: Pain. Right foot pain for several weeks with acute worsening.    FINDINGS: AP, oblique and lateral views of the right foot were  performed. The osseous structures and joint spaces appear normal with no  fracture, dislocation, or focal lesion.        Impression:        No acute bony findings of the right foot.    Elizebeth Koller, MD   09/23/2016 7:41 AM            Procedures    Clinical Impression & Disposition     Clinical Impression  Final diagnoses:   Right foot pain  Nodule of soft tissue        ED Disposition     ED Disposition Condition Date/Time Comment    Discharge  Sat Sep 23, 2016  7:57 AM Judy Freeman discharge to home/self care.    Condition at disposition: Stable           New Prescriptions    NAPROXEN (NAPROSYN) 500 MG TABLET    Take 1 tablet (500 mg  total) by mouth 2 (two) times daily with meals.    TRAMADOL (ULTRAM) 50 MG TABLET    Take 1 tablet (50 mg total) by mouth every 6 (six) hours as needed for Pain.Do not drive or operate machinery while taking this medication                 Helayne Seminole, MD  09/23/16 [phone]

## 2017-01-03 ENCOUNTER — Emergency Department: Payer: No Typology Code available for payment source

## 2017-01-03 ENCOUNTER — Emergency Department
Admission: EM | Admit: 2017-01-03 | Discharge: 2017-01-03 | Disposition: A | Discharge status: Home / Self Care | Payer: No Typology Code available for payment source | Attending: Emergency Medicine | Admitting: Emergency Medicine

## 2017-01-03 DIAGNOSIS — R0781 Pleurodynia: Secondary | ICD-10-CM | POA: Insufficient documentation

## 2017-01-03 DIAGNOSIS — M62838 Other muscle spasm: Secondary | ICD-10-CM | POA: Insufficient documentation

## 2017-01-03 DIAGNOSIS — G35 Multiple sclerosis: Secondary | ICD-10-CM | POA: Insufficient documentation

## 2017-01-03 LAB — COMPREHENSIVE METABOLIC PANEL
ALT: 8 U/L (ref 0–55)
AST (SGOT): 13 U/L (ref 5–34)
Albumin/Globulin Ratio: 0.8 — ABNORMAL LOW (ref 0.9–2.2)
Albumin: 3.8 g/dL (ref 3.5–5.0)
Alkaline Phosphatase: 66 U/L (ref 37–106)
Anion Gap: 13 (ref 5.0–15.0)
BUN: 8 mg/dL (ref 7.0–19.0)
Bilirubin, Total: 0.2 mg/dL (ref 0.2–1.2)
CO2: 24 mEq/L (ref 22–29)
Calcium: 9 mg/dL (ref 8.5–10.5)
Chloride: 100 mEq/L (ref 100–111)
Creatinine: 0.8 mg/dL (ref 0.6–1.0)
Globulin: 4.5 g/dL — ABNORMAL HIGH (ref 2.0–3.6)
Glucose: 100 mg/dL (ref 70–100)
Potassium: 4 mEq/L (ref 3.5–5.1)
Protein, Total: 8.3 g/dL (ref 6.0–8.3)
Sodium: 137 mEq/L (ref 136–145)

## 2017-01-03 LAB — CBC AND DIFFERENTIAL
Absolute NRBC: 0 10*3/uL
Basophils Absolute Automated: 0.05 10*3/uL (ref 0.00–0.20)
Basophils Automated: 0.7 %
Eosinophils Absolute Automated: 0.44 10*3/uL (ref 0.00–0.70)
Eosinophils Automated: 6.6 %
Hematocrit: 33.8 % — ABNORMAL LOW (ref 37.0–47.0)
Hgb: 10.7 g/dL — ABNORMAL LOW (ref 12.0–16.0)
Immature Granulocytes Absolute: 0.01 10*3/uL
Immature Granulocytes: 0.1 %
Lymphocytes Absolute Automated: 1.79 10*3/uL (ref 0.50–4.40)
Lymphocytes Automated: 26.8 %
MCH: 26.8 pg — ABNORMAL LOW (ref 28.0–32.0)
MCHC: 31.7 g/dL — ABNORMAL LOW (ref 32.0–36.0)
MCV: 84.5 fL (ref 80.0–100.0)
MPV: 10.8 fL (ref 9.4–12.3)
Monocytes Absolute Automated: 0.46 10*3/uL (ref 0.00–1.20)
Monocytes: 6.9 %
Neutrophils Absolute: 3.94 10*3/uL (ref 1.80–8.10)
Neutrophils: 58.9 %
Nucleated RBC: 0 /100 WBC (ref 0.0–1.0)
Platelets: 281 10*3/uL (ref 140–400)
RBC: 4 10*6/uL — ABNORMAL LOW (ref 4.20–5.40)
RDW: 16 % — ABNORMAL HIGH (ref 12–15)
WBC: 6.69 10*3/uL (ref 3.50–10.80)

## 2017-01-03 LAB — ECG 12-LEAD
Atrial Rate: 93 {beats}/min
P Axis: 35 degrees
P-R Interval: 136 ms
Q-T Interval: 332 ms
QRS Duration: 82 ms
QTC Calculation (Bezet): 412 ms
R Axis: 25 degrees
T Axis: 21 degrees
Ventricular Rate: 93 {beats}/min

## 2017-01-03 LAB — GFR: EGFR: >60

## 2017-01-03 LAB — C-REACTIVE PROTEIN: C-Reactive Protein: 2.7 mg/dL — ABNORMAL HIGH (ref 0.0–0.8)

## 2017-01-03 LAB — TROPONIN I: Troponin I: <0.01 ng/mL (ref 0.00–0.09)

## 2017-01-03 LAB — HCG, SERUM, QUALITATIVE: Hcg Qualitative: NEGATIVE

## 2017-01-03 LAB — SEDIMENTATION RATE: Sed Rate: 64 mm/Hr — ABNORMAL HIGH (ref 0–20)

## 2017-01-03 LAB — IHS D-DIMER: D-Dimer: <0.27 ug/mL FEU (ref 0.00–0.50)

## 2017-01-03 MED ORDER — IODIXANOL 320 MG/ML IV SOLN
100.0000 mL | Freq: Once | INTRAVENOUS | Status: AC | PRN
Start: 2017-01-03 — End: 2017-01-03
  Administered 2017-01-03: 100 mL via INTRAVENOUS

## 2017-01-03 MED ORDER — LIDOCAINE 5 % EX PTCH
1.0000 | MEDICATED_PATCH | CUTANEOUS | Status: DC
Start: 2017-01-03 — End: 2017-01-03
  Administered 2017-01-03: 1 via TRANSDERMAL
  Filled 2017-01-03: qty 1

## 2017-01-03 MED ORDER — LORAZEPAM 2 MG/ML IJ SOLN
1.0000 mg | Freq: Once | INTRAMUSCULAR | Status: AC
Start: 2017-01-03 — End: 2017-01-03
  Administered 2017-01-03: 1 mg via INTRAVENOUS
  Filled 2017-01-03: qty 1

## 2017-01-03 MED ORDER — PROMETHAZINE HCL 25 MG/ML IJ SOLN
12.5000 mg | Freq: Once | INTRAMUSCULAR | Status: AC
Start: 2017-01-03 — End: 2017-01-03
  Administered 2017-01-03: 12.5 mg via INTRAVENOUS
  Filled 2017-01-03: qty 1

## 2017-01-03 MED ORDER — AZITHROMYCIN 250 MG PO TABS
250.0000 mg | ORAL_TABLET | Freq: Every day | ORAL | 0 refills | Status: AC
Start: 2017-01-03 — End: 2017-01-07

## 2017-01-03 MED ORDER — SODIUM CHLORIDE 0.9 % IV BOLUS
1000.0000 mL | Freq: Once | INTRAVENOUS | Status: AC
Start: 2017-01-03 — End: 2017-01-03
  Administered 2017-01-03: 1000 mL via INTRAVENOUS

## 2017-01-03 MED ORDER — NAPROXEN 250 MG PO TABS
250.0000 mg | ORAL_TABLET | Freq: Two times a day (BID) | ORAL | 0 refills | Status: DC
Start: 2017-01-03 — End: 2019-01-11

## 2017-01-03 MED ORDER — DEXAMETHASONE SOD PHOSPHATE PF 10 MG/ML IJ SOLN
10.0000 mg | Freq: Once | INTRAMUSCULAR | Status: AC
Start: 2017-01-03 — End: 2017-01-03
  Administered 2017-01-03: 10 mg via INTRAVENOUS
  Filled 2017-01-03: qty 1

## 2017-01-03 MED ORDER — HYDROMORPHONE HCL 1 MG/ML IJ SOLN
1.0000 mg | Freq: Once | INTRAMUSCULAR | Status: AC
Start: 2017-01-03 — End: 2017-01-03
  Administered 2017-01-03: 1 mg via INTRAVENOUS
  Filled 2017-01-03: qty 1

## 2017-01-03 MED ORDER — AZITHROMYCIN 250 MG PO TABS
500.0000 mg | ORAL_TABLET | Freq: Once | ORAL | Status: AC
Start: 2017-01-03 — End: 2017-01-03
  Administered 2017-01-03: 500 mg via ORAL
  Filled 2017-01-03: qty 2

## 2017-01-03 MED ORDER — OXYCODONE-ACETAMINOPHEN 5-325 MG PO TABS
1.0000 | ORAL_TABLET | Freq: Four times a day (QID) | ORAL | Status: AC | PRN
Start: 2017-01-03 — End: 2017-01-13

## 2017-01-03 MED ORDER — KETOROLAC TROMETHAMINE 30 MG/ML IJ SOLN
15.0000 mg | Freq: Once | INTRAMUSCULAR | Status: AC
Start: 2017-01-03 — End: 2017-01-03
  Administered 2017-01-03: 15 mg via INTRAVENOUS
  Filled 2017-01-03: qty 1

## 2017-01-03 NOTE — ED Provider Notes (Signed)
Physician/Midlevel provider first contact with patient: 01/03/17 1610         History     Chief Complaint   Patient presents with   . Back Pain   . Cough     40 yof came into the ER for complaints of "pleurisy." She says that it started 3-4 days ago. She took a muscle relaxer without relief. She denies cough or fever. She states that she is a "big advocate" for medical marijuana and she smokes it and eats it and cook with the oil. She denies any history of pulmonary embolism. She denies any other injury.       The history is provided by the patient.   Chest Pain   Pain location:  L chest  Pain quality: sharp    Pain radiates to:  Mid back  Pain severity:  Moderate  Onset quality:  Gradual  Duration:  4 hours  Timing:  Constant  Progression:  Unchanged  Chronicity:  Recurrent  Context: breathing and movement    Relieved by:  Nothing  Worsened by:  Movement and deep breathing  Ineffective treatments: muscle relaxer and ibuprofen.  Associated symptoms: anxiety, back pain and palpitations    Associated symptoms: no abdominal pain, no cough, no diaphoresis, no dizziness, no fever, no headache, no nausea, no numbness, no shortness of breath, no vomiting and no weakness    Risk factors: obesity and smoking    Risk factors: no coronary artery disease, no diabetes mellitus and no prior DVT/PE             Past Medical History:   Diagnosis Date   . Asthma    . MS (multiple sclerosis)    . Pneumonia 2011, 2012   . Sepsis 2011       Past Surgical History:   Procedure Laterality Date   . CESAREAN SECTION     . CHOLECYSTECTOMY     . DEBRIDEMENT & IRRIGATION, ABSCESS  2015       Family History   Problem Relation Age of Onset   . Cancer Father    . Diabetes Maternal Aunt    . Cancer Maternal Aunt        Social  Social History   Substance Use Topics   . Smoking status: Never Smoker   . Smokeless tobacco: Never Used   . Alcohol use Yes      Comment: occasionally       .     Allergies   Allergen Reactions   . Penicillins Hives        Home Medications     Med List Status:  In Progress Set By: Briant Cedar, RN at 01/03/2017  3:14 AM                                                    Review of Systems   Constitutional: Negative for chills, diaphoresis and fever.   HENT: Negative for rhinorrhea and sore throat.    Eyes: Negative for pain and discharge.   Respiratory: Negative for cough and shortness of breath.    Cardiovascular: Positive for chest pain and palpitations.   Gastrointestinal: Negative for abdominal pain, nausea and vomiting.   Genitourinary: Negative for dysuria and frequency.   Musculoskeletal: Positive for arthralgias, back pain and myalgias.   Skin: Negative for color  change and rash.   Neurological: Negative for dizziness, weakness, numbness and headaches.   Psychiatric/Behavioral: Negative for suicidal ideas. The patient is not nervous/anxious.        Physical Exam    BP: 156/79, Heart Rate: (!) 109, Temp: 99 F (37.2 C), Resp Rate: 22, SpO2: 100 %, Weight: 83.9 kg    Physical Exam   Constitutional: She is oriented to person, place, and time. She appears well-developed and well-nourished. She appears distressed.   tearful   HENT:   Head: Normocephalic and atraumatic.   Eyes: Conjunctivae are normal. Right eye exhibits no discharge. Left eye exhibits no discharge.   Cardiovascular: Normal rate and regular rhythm.    Pulmonary/Chest: Effort normal and breath sounds normal. No respiratory distress. She has no wheezes. She exhibits tenderness and bony tenderness. She exhibits no crepitus and no deformity.       Pain along 5th-7th ribs, worse with palpation and movement   Abdominal: Soft. There is no tenderness.   Musculoskeletal: She exhibits no edema.        Thoracic back: She exhibits tenderness, pain and spasm. She exhibits no swelling and no deformity.        Back:    Neurological: She is alert and oriented to person, place, and time.   Skin: Skin is warm and dry. No rash noted.   Psychiatric: Her speech is normal. Her mood  appears anxious. Her affect is angry. She is agitated. She exhibits a depressed mood.   Nursing note and vitals reviewed.        MDM and ED Course     ED Medication Orders     Start Ordered     Status Ordering Provider    01/03/17 0445 01/03/17 0435  dexamethasone (DECADRON) injection 10 mg  Once     Route: Intravenous  Ordered Dose: 10 mg     Last MAR action:  Given Terance Ice    01/03/17 0445 01/03/17 0435  lidocaine (LIDODERM) 5 % 1 patch  Every 24 hours     Route: Transdermal  Ordered Dose: 1 patch     Last MAR action:  Patch Applied Elyn Aquas J    01/03/17 0445 01/03/17 0438  azithromycin (ZITHROMAX) tablet 500 mg  Once     Route: Oral  Ordered Dose: 500 mg     Last MAR action:  Given Terance Ice    01/03/17 0330 01/03/17 0324  sodium chloride 0.9 % bolus 1,000 mL  Once     Route: Intravenous  Ordered Dose: 1,000 mL     Last MAR action:  New Bag Elyn Aquas J    01/03/17 0330 01/03/17 0325  HYDROmorphone (DILAUDID) injection 1 mg  Once     Route: Intravenous  Ordered Dose: 1 mg     Last MAR action:  Given Elyn Aquas J    01/03/17 0330 01/03/17 0325  promethazine (PHENERGAN) injection 12.5 mg  Once     Route: Intravenous  Ordered Dose: 12.5 mg     Last MAR action:  Given Elyn Aquas J    01/03/17 0330 01/03/17 0325  LORazepam (ATIVAN) injection 1 mg  Once     Route: Intravenous  Ordered Dose: 1 mg     Last MAR action:  Given Terance Ice             MDM    EKG Interpretation 01/03/17 Time 0333  EKG interpreted by ED physician  Rate: Normal for age, 18  Rhythm: Normal sinus rhythm  Axis: Normal for age  PR, QRS and QT intervals:  normal for age and rate  ST Segments: No deviations suggestive of ischemia  Impression: Normal ECG with no evidence of ischemia.    Attending : Dr. Elyn Aquas     Results     Procedure Component Value Units Date/Time    Troponin I [[id number]] Collected:  01/03/17 0325    Specimen:  Blood Updated:  01/03/17 0355     Troponin I <0.01  ng/mL     Sedimentation rate (ESR) [[id number]]  (Abnormal) Collected:  01/03/17 0325    Specimen:  Blood Updated:  01/03/17 0351     Sed Rate 64 (H) mm/Hr     Comprehensive metabolic panel [[id number]]  (Abnormal) Collected:  01/03/17 0325    Specimen:  Blood Updated:  01/03/17 0348     Glucose 100 mg/dL      BUN 8.0 mg/dL      Creatinine 0.8 mg/dL      Sodium 086 mEq/L      Potassium 4.0 mEq/L      Chloride 100 mEq/L      CO2 24 mEq/L      Calcium 9.0 mg/dL      Protein, Total 8.3 g/dL      Albumin 3.8 g/dL      AST (SGOT) 13 U/L      ALT 8 U/L      Alkaline Phosphatase 66 U/L      Bilirubin, Total 0.2 mg/dL      Globulin 4.5 (H) g/dL      Albumin/Globulin Ratio 0.8 (L)     Anion Gap 13.0    C-reactive Protein (CRP) [[id number]]  (Abnormal) Collected:  01/03/17 0325    Specimen:  Blood Updated:  01/03/17 0348     C-Reactive Protein 2.7 (H) mg/dL     GFR [[id number]] Collected:  01/03/17 0325     Updated:  01/03/17 0348     EGFR >60.0    D-Dimer [[id number]] Collected:  01/03/17 0325     Updated:  01/03/17 0347     D-Dimer <0.27 ug/mL FEU     CBC with differential [[id number]]  (Abnormal) Collected:  01/03/17 0325    Specimen:  Blood from Blood Updated:  01/03/17 0331     WBC 6.69 x10 3/uL      Hgb 10.7 (L) g/dL      Hematocrit 59.5 (L) %      Platelets 281 x10 3/uL      RBC 4.00 (L) x10 6/uL      MCV 84.5 fL      MCH 26.8 (L) pg      MCHC 31.7 (L) g/dL      RDW 16 (H) %      MPV 10.8 fL      Neutrophils 58.9 %      Lymphocytes Automated 26.8 %      Monocytes 6.9 %      Eosinophils Automated 6.6 %      Basophils Automated 0.7 %      Immature Granulocyte 0.1 %      Nucleated RBC 0.0 /100 WBC      Neutrophils Absolute 3.94 x10 3/uL      Abs Lymph Automated 1.79 x10 3/uL      Abs Mono Automated 0.46 x10 3/uL      Abs Eos Automated 0.44 x10 3/uL      Absolute Baso Automated 0.05 x10 3/uL  Absolute Immature Granulocyte 0.01 x10 3/uL      Absolute NRBC 0.00 x10 3/uL           Radiology Results (24 Hour)     Procedure  Component Value Units Date/Time    Chest 2 Views [[id number]] Collected:  01/03/17 0518    Order Status:  Completed Updated:  01/03/17 0525    Narrative:       HISTORY: Chest pain on the left    PA and lateral images of the chest show heart size and the vascular  pattern to be normal. Aortic contour is normal. Is no focal airspace  process, pleural effusion, atelectasis or pneumothorax.      Impression:         1. No acute cardiopulmonary abnormality identified.    Olen Pel, MD   01/03/2017 5:21 AM          I have reviewed all labs and/or radiological studies. I have reviewed all xrays if any myself on the PACS system.      After my evaluation and physical exam, I told the patient that I would order something for her pain and she stopped me from leaving and asked what I would be giving her. She said that she had to ask this because "only dilaudid in the IV helps with the pain."  She also made the comment that "many doctors don't believe African American women about their pain." I informed her that was an unfair comment and that I never said that I would not give her dilaudid. All I asked was what usually helps her. She started on a tirade about how one doctor said that the pain was all in her head. I simply said that pain isn't always the typical presentation for MS flare, but that I was happy to treat her pain and needed to make sure it wasn't something more concerning like a PE or pneumothorax from smoking. I asked her if she ever had a CT to r/o PE in the past and she says her lung doctor says that her lungs are fine.  I walked out like I originally intended to and I did order dilaudid and phenergan and ativan.  In review of her medical records, I did see many complaints for pain and reference to a pain management doctor and Dr. Dorene Grebe as her neurologist. I did find that when she was last diagnosed with pleurisy in 10/15 she had a CTA that was negative.     IMPRESSION:     CT angiogram of the chest shows no  acute abnormality.        There is no detectable pulmonary embolism.  No abnormality is seen to  explain the reported symptoms.                           Miguel Dibble, MD   01/16/2014 2:29 AM    Procedures    Clinical Impression & Disposition     Clinical Impression  Final diagnoses:   MS (multiple sclerosis)   Muscle spasm        ED Disposition     ED Disposition Condition Date/Time Comment    Discharge  Wed Jan 03, 2017  4:49 AM Judy Freeman discharge to home/self care.    Condition at disposition: Stable           New Prescriptions    AZITHROMYCIN (ZITHROMAX) 250 MG TABLET    Take 1  tablet (250 mg total) by mouth daily.for 4 doses    OXYCODONE-ACETAMINOPHEN (PERCOCET) 5-325 MG PER TABLET    Take 1 tablet by mouth every 6 (six) hours as needed for Pain.for up to 10 days                 Terance Ice, DO  01/03/17 1610

## 2017-01-03 NOTE — ED Notes (Signed)
IS teaching done

## 2017-01-03 NOTE — ED Provider Notes (Signed)
Physician/Midlevel provider first contact with patient: 01/03/17 1719         History     Chief Complaint   Patient presents with   . Rib pain     41 yo female with c/o left rib pain and sob x 4 days. Seen her last night for same. Pain worse with deep breathing. States she was given abx for possible pneumonia but hasn't filled her rx yet d/t insurance problems. C/o persistant symptoms. Is requesting a cat scan of her chest. Denies recent air travel, surgery, smoking or hx pe/dvt.      The history is provided by the patient.   Shortness of Breath   Severity:  Moderate  Onset quality:  Gradual  Duration:  4 days  Timing:  Constant  Progression:  Unchanged  Chronicity:  New  Relieved by:  Nothing  Worsened by:  Coughing and deep breathing  Ineffective treatments:  Rest  Associated symptoms: cough    Associated symptoms: no abdominal pain, no chest pain, no fever, no sore throat and no vomiting             Past Medical History:   Diagnosis Date   . Asthma    . MS (multiple sclerosis)    . Pneumonia 2011, 2012   . Sepsis 2011       Past Surgical History:   Procedure Laterality Date   . CESAREAN SECTION     . CHOLECYSTECTOMY     . DEBRIDEMENT & IRRIGATION, ABSCESS  2015       Family History   Problem Relation Age of Onset   . Cancer Father    . Diabetes Maternal Aunt    . Cancer Maternal Aunt        Social  Social History   Substance Use Topics   . Smoking status: Never Smoker   . Smokeless tobacco: Never Used   . Alcohol use Yes      Comment: occasionally       .     Allergies   Allergen Reactions   . Penicillins Hives       Home Medications     Med List Status:  In Progress Set By: Ralene Cork, RN at 01/03/2017  5:17 PM                azithromycin (ZITHROMAX) 250 MG tablet     Take 1 tablet (250 mg total) by mouth daily.for 4 doses     oxyCODONE-acetaminophen (PERCOCET) 5-325 MG per tablet     Take 1 tablet by mouth every 6 (six) hours as needed for Pain.for up to 10 days                                         Review  of Systems   Constitutional: Negative for chills and fever.   HENT: Negative for congestion and sore throat.    Eyes: Negative for discharge and redness.   Respiratory: Positive for cough and shortness of breath.    Cardiovascular: Negative for chest pain, palpitations and leg swelling.   Gastrointestinal: Negative for abdominal pain, nausea and vomiting.   Genitourinary: Negative for difficulty urinating, dysuria and flank pain.   Musculoskeletal: Negative for back pain.   Skin: Negative for color change and wound.   Neurological: Negative for dizziness and light-headedness.   Hematological: Does not bruise/bleed easily.  Physical Exam    BP: (!) 167/100, Heart Rate: (!) 110, Temp: 99 F (37.2 C), Resp Rate: 18, SpO2: 100 %, Weight: 83.9 kg    Physical Exam   Constitutional: She is oriented to person, place, and time. She appears well-developed and well-nourished. She appears distressed.   crying   HENT:   Head: Normocephalic and atraumatic.   Eyes: Conjunctivae are normal. Right eye exhibits no discharge. Left eye exhibits no discharge.   Neck: Normal range of motion. Neck supple.   Cardiovascular: Regular rhythm.    tachy   Pulmonary/Chest: Effort normal and breath sounds normal. No respiratory distress. She exhibits tenderness.   Left lateral chest ttp under left breast and lateral ribs, no edema, no ecchymosis or crepitus   Abdominal: Soft. She exhibits no distension. There is no tenderness.   Musculoskeletal: Normal range of motion.   No calf ttp   Neurological: She is alert and oriented to person, place, and time.   Skin: Skin is warm and dry.   Psychiatric:   crying   Nursing note and vitals reviewed.        MDM and ED Course     ED Medication Orders     Start Ordered     Status Ordering Provider    01/03/17 1745 01/03/17 1731  ketorolac (TORADOL) injection 15 mg  Once     Route: Intravenous  Ordered Dose: 15 mg     Last MAR action:  Given Bradly Sangiovanni JEAN    01/03/17 1745 01/03/17 1732  sodium chloride  0.9 % bolus 1,000 mL  Once     Route: Intravenous  Ordered Dose: 1,000 mL     Last MAR action:  Stopped Octave Montrose JEAN             MDM  Number of Diagnoses or Management Options  Rib pain on left side:   Diagnosis management comments: I, Polo Riley PA-C, have been the primary provider for Padme M Doan during this Emergency Dept visit.  Oxygen saturation by pulse oximetry is 95%-100%, Normal.  Interventions: None Needed    Pt specifically requesting a chest ct. D/w pt in detail risk/benefit of ct including unnecessary radiation with negative d-dimer last night, pt expresses understanding and adamantly requests ct to be done.     The attending signature signifies review and agreement of the history, physical examination, evaluation, clinical impression and plan except as noted  Pt seen by Dr.Virmani who is in agreement with plan.    Results of ed work up d/w pt. Feeling much better after toradol. Recommended immediate er return for any new or worsening problems or concerns. Pt expressed understanding and agreement with [place] plan. Well appearing upon [place].          Amount and/or Complexity of Data Reviewed  Tests in the radiology section of CPT: ordered and reviewed  Review and summarize past medical records: yes                     Procedures    Clinical Impression & Disposition     Clinical Impression  Final diagnoses:   Rib pain on left side        ED Disposition     ED Disposition Condition Date/Time Comment    Discharge  Wed Jan 03, 2017  7:13 PM Brittnee M Shimkus discharge to home/self care.    Condition at disposition: Stable           Discharge  Medication List as of 01/03/2017  7:13 PM      START taking these medications    Details   naproxen (NAPROSYN) 250 MG tablet Take 1 tablet (250 mg total) by mouth 2 (two) times daily with meals., Starting Wed 01/03/2017, Print                       Everardo Pacific C[place], [place]  01/03/17 2014       Helayne Seminole, MD  01/05/17 [phone]

## 2017-01-03 NOTE — Discharge Instructions (Signed)
Return to the ER immediately for any new or worsening problems or concerns    Pleurisy    You have been diagnosed with pleurisy.    Pleurisy is a swelling or irritation. It affects the chest cavity lining and lungs and causes sharp chest pain. The pain is worse when breathing in (inhaling).    There are other more serious problems that can cause pain with breathing, however. The doctor who saw you today believes your problem is not serious.    You had a helical (spiral) CT scan of your chest. This was to look for blood clots in the lung. The test results show it is unlikely that you have a blood clot in the lung.    Treatment may include an anti-inflammatory medicine. If you were given a prescription for an anti-inflammatory medicine, do not take other over-the-counter (without prescription) anti-inflammatories like ibuprofen (Advil or Motrin) and naproxen (Naprosyn or Aleve).    Take deep breaths. This will keep your lungs expanded and help prevent partial lung collapse (atelectasis) from developing. It will also prevent pneumonia.    YOU SHOULD SEEK MEDICAL ATTENTION IMMEDIATELY, EITHER HERE OR AT THE NEAREST EMERGENCY DEPARTMENT, IF ANY OF THE FOLLOWING OCCURS:   Trouble breathing or wheezing.   Coughing up blood.   Fever (temperature higher than 100.72F / 38C) or any fever that doesn't go away.   Chest pain.   No improvement in 48 to 72 hours or symptoms get worse.

## 2017-01-03 NOTE — ED Provider Notes (Signed)
ATTENDING : Helayne Seminole, MD. I HAVE PERSONALLY SEEN AND EXAMINED PATIENT. I have personally obtained a history, performed a focused PE, and participated in the medical decision making of this patient.     I have discussed and agree with the care plan of the Advanced Care Provider.    Brief Note -left-sided rib pain that is developed over the last 4 days that is constant, moderate in nature that is worsened with deep breathing.  Negative workup last night in the emergency department including normal blood work, d-dimer.  Patient returns because pain is still present.  On physical examination she is splinting on deep breathing but bilateral lung sounds are present.  No abdominal tenderness is present.  Heart sounds are normal.  Plan to do CT imaging per patient's request and reevaluation.         Helayne Seminole, MD  01/03/17 [phone]

## 2017-01-03 NOTE — Discharge Instructions (Signed)
Multiple Sclerosis Exacerbation    You have been seen for an exacerbation (flare-up) of your multiple sclerosis (MS) symptoms.     A flare-up of MS can happen when there is inflammation (irritation) anywhere in the nervous system. This damages the myelin. Myelin is also called "white matter." With myelin damage, the nerve signals are slowed as they move down the nerve. This causes the symptoms you have.    A flare-up can happen in a new area of damage in the white matter of the brain or spinal cord. The area of damage is called a lesion. When the new lesion forms, a part of your body that worked fine yesterday might suddenly be numb, weak or tingle. Steroids can treat this. These include prednisone (Prelone/Orapred). They help with inflammation. With treatment, symptoms often get better over a few weeks.    Sometimes, the symptoms may not be from a true "flare-up." A new lesion may not be the cause. The symptoms can be from a problem with nerve conduction in an area with old MS damage. Such problems are temporary. These areas may have worked well until you got a fever (temperature higher than 100.67F / 38C) or got sick with an infection. When the infection goes away or the fever comes down, the symptoms usually get better quickly.    Some symptoms of an MS flare-up can include:   Changes in vision because the optic nerve gets inflamed. This is called optic neuritis.   Balance problems.   Severe fatigue.   Weakness in part of your body.   Numbness, or loss of feeling in any part of your body.   Any neurological symptom or symptoms, depending on where the inflammation is in the nervous system.    See your doctor or neurologist about this visit and the MS flare-up.    YOU SHOULD SEEK MEDICAL ATTENTION IMMEDIATELY, EITHER HERE OR AT THE NEAREST EMERGENCY DEPARTMENT, IF ANY OF THE FOLLOWING OCCUR:   The symptoms get worse or you get sicker.   You can't take care of yourself because of the  symptoms.   You have any other problems or concerns.

## 2017-02-13 ENCOUNTER — Other Ambulatory Visit: Payer: Self-pay | Admitting: Obstetrics & Gynecology

## 2017-02-13 DIAGNOSIS — N946 Dysmenorrhea, unspecified: Secondary | ICD-10-CM

## 2017-02-13 DIAGNOSIS — N6320 Unspecified lump in the left breast, unspecified quadrant: Secondary | ICD-10-CM

## 2017-02-13 DIAGNOSIS — N921 Excessive and frequent menstruation with irregular cycle: Secondary | ICD-10-CM

## 2017-02-14 ENCOUNTER — Ambulatory Visit
Admission: RE | Admit: 2017-02-14 | Discharge: 2017-02-14 | Disposition: A | Discharge status: Home / Self Care | Payer: No Typology Code available for payment source | Source: Ambulatory Visit | Attending: Obstetrics & Gynecology | Admitting: Obstetrics & Gynecology

## 2017-02-14 DIAGNOSIS — N921 Excessive and frequent menstruation with irregular cycle: Secondary | ICD-10-CM

## 2017-02-14 DIAGNOSIS — N946 Dysmenorrhea, unspecified: Secondary | ICD-10-CM | POA: Insufficient documentation

## 2017-02-14 DIAGNOSIS — N920 Excessive and frequent menstruation with regular cycle: Secondary | ICD-10-CM | POA: Insufficient documentation

## 2017-02-22 ENCOUNTER — Ambulatory Visit
Admission: RE | Admit: 2017-02-22 | Discharge: 2017-02-22 | Disposition: A | Discharge status: Home / Self Care | Payer: No Typology Code available for payment source | Source: Ambulatory Visit | Attending: Obstetrics & Gynecology | Admitting: Obstetrics & Gynecology

## 2017-02-22 DIAGNOSIS — N6320 Unspecified lump in the left breast, unspecified quadrant: Secondary | ICD-10-CM

## 2017-02-22 DIAGNOSIS — N63 Unspecified lump in unspecified breast: Secondary | ICD-10-CM

## 2017-02-22 HISTORY — DX: Unspecified lump in unspecified breast: N63.0

## 2018-04-16 ENCOUNTER — Other Ambulatory Visit: Payer: Self-pay | Admitting: Neuromusculoskeletal Medicine & OMM

## 2018-04-16 DIAGNOSIS — G35 Multiple sclerosis: Secondary | ICD-10-CM

## 2018-04-17 ENCOUNTER — Ambulatory Visit
Admission: RE | Admit: 2018-04-17 | Discharge: 2018-04-17 | Disposition: A | Discharge status: Home / Self Care | Payer: BC Managed Care – PPO | Source: Ambulatory Visit | Attending: Neuromusculoskeletal Medicine & OMM | Admitting: Neuromusculoskeletal Medicine & OMM

## 2018-04-17 DIAGNOSIS — G9589 Other specified diseases of spinal cord: Secondary | ICD-10-CM | POA: Insufficient documentation

## 2018-04-17 DIAGNOSIS — M47814 Spondylosis without myelopathy or radiculopathy, thoracic region: Secondary | ICD-10-CM | POA: Insufficient documentation

## 2018-04-17 DIAGNOSIS — M47812 Spondylosis without myelopathy or radiculopathy, cervical region: Secondary | ICD-10-CM | POA: Insufficient documentation

## 2018-04-17 DIAGNOSIS — G35 Multiple sclerosis: Secondary | ICD-10-CM | POA: Insufficient documentation

## 2018-04-17 MED ORDER — GADOBUTROL 1 MMOL/ML IV SOLN
10.00 mL | Freq: Once | INTRAVENOUS | Status: AC | PRN
Start: 2018-04-17 — End: 2018-04-17
  Administered 2018-04-17: 22:00:00 10 mmol via INTRAVENOUS
  Filled 2018-04-17: qty 10

## 2018-04-29 ENCOUNTER — Emergency Department: Payer: BC Managed Care – PPO

## 2018-04-29 ENCOUNTER — Emergency Department
Admission: EM | Admit: 2018-04-29 | Discharge: 2018-04-29 | Disposition: A | Discharge status: Home / Self Care | Payer: BC Managed Care – PPO | Attending: Emergency Medicine | Admitting: Emergency Medicine

## 2018-04-29 DIAGNOSIS — M2550 Pain in unspecified joint: Secondary | ICD-10-CM

## 2018-04-29 DIAGNOSIS — R112 Nausea with vomiting, unspecified: Secondary | ICD-10-CM | POA: Insufficient documentation

## 2018-04-29 DIAGNOSIS — G35 Multiple sclerosis: Secondary | ICD-10-CM | POA: Insufficient documentation

## 2018-04-29 DIAGNOSIS — R531 Weakness: Secondary | ICD-10-CM | POA: Insufficient documentation

## 2018-04-29 DIAGNOSIS — E079 Disorder of thyroid, unspecified: Secondary | ICD-10-CM | POA: Insufficient documentation

## 2018-04-29 DIAGNOSIS — M25511 Pain in right shoulder: Secondary | ICD-10-CM | POA: Insufficient documentation

## 2018-04-29 HISTORY — DX: Disorder of thyroid, unspecified: E07.9

## 2018-04-29 LAB — COMPREHENSIVE METABOLIC PANEL
ALT: 15 U/L (ref 0–55)
AST (SGOT): 18 U/L (ref 5–34)
Albumin/Globulin Ratio: 0.9 (ref 0.9–2.2)
Albumin: 4.4 g/dL (ref 3.5–5.0)
Alkaline Phosphatase: 63 U/L (ref 37–106)
Anion Gap: 12 (ref 5.0–15.0)
BUN: 12 mg/dL (ref 7.0–19.0)
Bilirubin, Total: 0.3 mg/dL (ref 0.2–1.2)
CO2: 24 mEq/L (ref 22–29)
Calcium: 9.1 mg/dL (ref 8.5–10.5)
Chloride: 98 mEq/L — ABNORMAL LOW (ref 100–111)
Creatinine: 0.8 mg/dL (ref 0.6–1.0)
Globulin: 4.8 g/dL — ABNORMAL HIGH (ref 2.0–3.6)
Glucose: 112 mg/dL — ABNORMAL HIGH (ref 70–100)
Potassium: 3.7 mEq/L (ref 3.5–5.1)
Protein, Total: 9.2 g/dL — ABNORMAL HIGH (ref 6.0–8.3)
Sodium: 134 mEq/L — ABNORMAL LOW (ref 136–145)

## 2018-04-29 LAB — CBC AND DIFFERENTIAL
Absolute NRBC: 0 10*3/uL (ref 0.00–0.00)
Basophils Absolute Automated: 0.06 10*3/uL (ref 0.00–0.08)
Basophils Automated: 0.8 %
Eosinophils Absolute Automated: 0.04 10*3/uL (ref 0.00–0.44)
Eosinophils Automated: 0.5 %
Hematocrit: 35.3 % (ref 34.7–43.7)
Hgb: 11.3 g/dL — ABNORMAL LOW (ref 11.4–14.8)
Immature Granulocytes Absolute: 0.02 10*3/uL (ref 0.00–0.07)
Immature Granulocytes: 0.3 %
Lymphocytes Absolute Automated: 1.51 10*3/uL (ref 0.42–3.22)
Lymphocytes Automated: 20.3 %
MCH: 27.7 pg (ref 25.1–33.5)
MCHC: 32 g/dL (ref 31.5–35.8)
MCV: 86.5 fL (ref 78.0–96.0)
MPV: 10.3 fL (ref 8.9–12.5)
Monocytes Absolute Automated: 0.67 10*3/uL (ref 0.21–0.85)
Monocytes: 9 %
Neutrophils Absolute: 5.14 10*3/uL (ref 1.10–6.33)
Neutrophils: 69.1 %
Nucleated RBC: 0 /100 WBC (ref 0.0–0.0)
Platelets: 304 10*3/uL (ref 142–346)
RBC: 4.08 10*6/uL (ref 3.90–5.10)
RDW: 16 % — ABNORMAL HIGH (ref 11–15)
WBC: 7.44 10*3/uL (ref 3.10–9.50)

## 2018-04-29 LAB — URINALYSIS REFLEX TO MICROSCOPIC EXAM - REFLEX TO CULTURE
Bilirubin, UA: NEGATIVE
Blood, UA: NEGATIVE
Glucose, UA: NEGATIVE
Ketones UA: NEGATIVE
Leukocyte Esterase, UA: NEGATIVE
Nitrite, UA: NEGATIVE
Protein, UR: 30 — AB
Specific Gravity UA: 1.015 (ref 1.001–1.035)
Urine pH: 7 (ref 5.0–8.0)
Urobilinogen, UA: NORMAL mg/dL (ref 0.2–2.0)
WBC, UA: NONE SEEN /hpf (ref 0–5)

## 2018-04-29 LAB — LIPASE: Lipase: 13 U/L (ref 8–78)

## 2018-04-29 LAB — GROUP A STREP, RAPID ANTIGEN: Group A Strep, Rapid Antigen: NEGATIVE

## 2018-04-29 LAB — GFR: EGFR: >60

## 2018-04-29 LAB — HCG, SERUM, QUALITATIVE: Hcg Qualitative: NEGATIVE

## 2018-04-29 MED ORDER — FENTANYL CITRATE (PF) 50 MCG/ML IJ SOLN (WRAP)
50.00 ug | Freq: Once | INTRAMUSCULAR | Status: AC
Start: 2018-04-29 — End: 2018-04-29
  Administered 2018-04-29: 12:00:00 50 ug via INTRAVENOUS
  Filled 2018-04-29: qty 2

## 2018-04-29 MED ORDER — ONDANSETRON HCL 4 MG/2ML IJ SOLN
4.00 mg | Freq: Once | INTRAMUSCULAR | Status: AC
Start: 2018-04-29 — End: 2018-04-29
  Administered 2018-04-29: 10:00:00 4 mg via INTRAVENOUS
  Filled 2018-04-29: qty 2

## 2018-04-29 MED ORDER — SODIUM CHLORIDE 0.9 % IV BOLUS
1000.00 mL | Freq: Once | INTRAVENOUS | Status: AC
Start: 2018-04-29 — End: 2018-04-29
  Administered 2018-04-29: 10:00:00 1000 mL via INTRAVENOUS

## 2018-04-29 MED ORDER — PROMETHAZINE HCL 25 MG PO TABS
25.0000 mg | ORAL_TABLET | Freq: Three times a day (TID) | ORAL | 0 refills | Status: DC | PRN
Start: 2018-04-29 — End: 2019-01-11

## 2018-04-29 MED ORDER — HYDROCODONE-ACETAMINOPHEN 5-325 MG PO TABS
1.0000 | ORAL_TABLET | Freq: Four times a day (QID) | ORAL | 0 refills | Status: DC | PRN
Start: 2018-04-29 — End: 2019-01-11

## 2018-04-29 MED ORDER — KETOROLAC TROMETHAMINE 15 MG/ML IJ SOLN
15.00 mg | Freq: Once | INTRAMUSCULAR | Status: AC
Start: 2018-04-29 — End: 2018-04-29
  Administered 2018-04-29: 10:00:00 15 mg via INTRAVENOUS
  Filled 2018-04-29: qty 1

## 2018-04-29 MED ORDER — PROMETHAZINE HCL 25 MG/ML IJ SOLN
12.50 mg | Freq: Once | INTRAMUSCULAR | Status: AC
Start: 2018-04-29 — End: 2018-04-29
  Administered 2018-04-29: 12:00:00 12.5 mg via INTRAVENOUS
  Filled 2018-04-29: qty 1

## 2018-04-29 NOTE — ED Provider Notes (Signed)
Physician/Midlevel provider first contact with patient: 04/29/18 5784         History     Chief Complaint   Patient presents with    Nausea    Generalized Body Aches         Historian: Patient    Chief Complaint: Vomiting  Location:    Timing: This morning  Severity: Moderate severe  Quality: Nonbloody  Modifying Factors: None  Associated signs and symptoms: Cough, congestion, weakness  Context:      HPI: 43 year old female presented with complaint of vomiting starting this morning associated generalized weakness.  Has had issues related to URI symptoms over the past several days.  Does have sick contacts at home.  Patient does have history of MS.  Is not currently on any medication for. Also reporting pain in the right shoulder which has been present for the at least the past month. States that her neurologist recommends that she follow up with a rheumatologist.         Past Medical History:   Diagnosis Date    Asthma     Breast lump 02/22/2017    painful lump left breast    Disorder of thyroid     MS (multiple sclerosis)     Pneumonia 2011, 2012    Sepsis 2011       Past Surgical History:   Procedure Laterality Date    CESAREAN SECTION      CHOLECYSTECTOMY      DEBRIDEMENT & IRRIGATION, ABSCESS  2015       Family History   Problem Relation Age of Onset    Cancer Father     Diabetes Maternal Aunt     Cancer Maternal Aunt     Breast cancer Maternal Aunt        Social  Social History     Tobacco Use    Smoking status: Never Smoker    Smokeless tobacco: Never Used   Substance Use Topics    Alcohol use: Yes     Comment: occasionally    Drug use: Yes     Types: Marijuana     Comment: for her MS- medical marijuana       .     Allergies   Allergen Reactions    Penicillins Hives       Home Medications     Med List Status:  In Progress Set By: Maura Crandall, LPN at 69/62/9528  9:24 AM                celecoxib (CELEBREX) 200 MG capsule     Take 200 mg by mouth 2 (two) times daily     LEVOTHYROXINE  SODIUM PO     Take by mouth     naproxen (NAPROSYN) 250 MG tablet     Take 1 tablet (250 mg total) by mouth 2 (two) times daily with meals.           Review of Systems   Constitutional: Positive for fatigue. Negative for fever.   HENT: Positive for congestion.    Respiratory: Positive for cough. Negative for shortness of breath.    Cardiovascular: Negative for chest pain and palpitations.   Gastrointestinal: Positive for nausea and vomiting. Negative for abdominal pain and diarrhea.   Genitourinary: Positive for frequency. Negative for dysuria.   Skin: Negative for color change.   All other systems reviewed and are negative.      Physical Exam    BP: 172/89, Heart Rate:  78, Temp: 97.4 F (36.3 C), Resp Rate: 18, SpO2: 100 %, Weight: 98.4 kg     Physical Exam  Constitutional:       General: She is in acute distress.      Appearance: She is well-developed.   HENT:      Head: Normocephalic and atraumatic.   Eyes:      General: No scleral icterus.        Right eye: No discharge.         Left eye: No discharge.      Conjunctiva/sclera: Conjunctivae normal.   Neck:      Vascular: No JVD.      Trachea: No tracheal deviation.   Cardiovascular:      Rate and Rhythm: Normal rate and regular rhythm.      Heart sounds: Normal heart sounds. No murmur. No friction rub. No gallop.    Pulmonary:      Effort: Pulmonary effort is normal. No respiratory distress.      Breath sounds: Normal breath sounds. No stridor. No wheezing or rales.   Abdominal:      General: Bowel sounds are normal. There is no distension.      Palpations: Abdomen is soft. There is no mass.      Tenderness: There is no abdominal tenderness. There is no guarding or rebound.   Musculoskeletal: Normal range of motion.         General: No tenderness.   Skin:     General: Skin is warm and dry.   Neurological:      Mental Status: She is alert and oriented to person, place, and time.      GCS: GCS eye subscore is 4. GCS verbal subscore is 5. GCS motor subscore is 6.    Psychiatric:         Speech: Speech normal.         Behavior: Behavior normal.         Thought Content: Thought content normal.         Judgment: Judgment normal.           MDM and ED Course     ED Medication Orders (From admission, onward)    Start Ordered     Status Ordering Provider    04/29/18 1130 04/29/18 1115  fentaNYL (PF) (SUBLIMAZE) injection 50 mcg  Once     Route: Intravenous  Ordered Dose: 50 mcg     Last MAR action:  Given Cyprian Gongaware    04/29/18 1130 04/29/18 1126  promethazine (PHENERGAN) injection 12.5 mg  Once     Route: Intravenous  Ordered Dose: 12.5 mg     Last MAR action:  Given Aidan Caloca    04/29/18 0945 04/29/18 0935  ondansetron (ZOFRAN) injection 4 mg  Once     Route: Intravenous  Ordered Dose: 4 mg     Last MAR action:  Given Elyn Krogh    04/29/18 0945 04/29/18 0935  ketorolac (TORADOL) injection 15 mg  Once     Route: Intravenous  Ordered Dose: 15 mg     Last MAR action:  Given Marabelle Cushman    04/29/18 0945 04/29/18 0935  sodium chloride 0.9 % bolus 1,000 mL  Once     Route: Intravenous  Ordered Dose: 1,000 mL     Last MAR action:  Stopped Ervine Witucki             MDM  Number of Diagnoses or Management Options  Diagnosis  management comments: I, Earline Mayotte MD, have been the primary provider for Raeley M Tischer during this Emergency Dept visit.      DDx includes, but is not limited to: Viral syndrome, dehydration, gastroenteritis, pneumonia    Initial Plan: Labs, chest x-ray, IV fluids, Zofran, Toradol      Discussed labs and x-ray with patient.  States that she still feels nauseated at this time.  Also states that Toradol did not help her pain.  Advised that we will give her something additional for her nausea as well as her from pain ER.  Advised that we would be unable to prescribe long-term medications for pain control. reviewed PMP and I did state that I would prescribe a short-term prescription for pain medication at this time in addition to the  Phenergan.  Also advised patient to follow-up with pain management and rheumatology as an outpatient. Patient did become visibly upset and agitated after I discussed pain medication and limitations of the ER with her. She states that she felt that she was not being heard and listened to. I did ask her what else I could do to help her. States that she is not feeling well. I did ask her if she felt well enough to go home at this time or if she felt like she needed to be in the hospital at this time. Patient declined admission. I again advised her to follow up with her physicians. Also provided her with referral list for pain management in addition to a rheumatologist for follow up.     Results     Procedure Component Value Units Date/Time    Urinalysis Reflex to Microscopic Exam- Reflex to Culture [[id number]]  (Abnormal) Collected:  04/29/18 1050     Updated:  04/29/18 1058     Urine Type Urine, Clean Ca     Color, UA Straw     Clarity, UA Clear     Specific Gravity UA 1.015     Urine pH 7.0     Leukocyte Esterase, UA Negative     Nitrite, UA Negative     Protein, UR 30     Glucose, UA Negative     Ketones UA Negative     Urobilinogen, UA Normal mg/dL      Bilirubin, UA Negative     Blood, UA Negative     WBC, UA None Seen /hpf      Squamous Epithelial Cells, Urine 0 - 5 /hpf      Urine Mucus Present    Narrative:       Replace urinary catheter prior to obtaining the urine culture  if it has been in place for greater than or equal to 14  days:->N/A No Foley  Indications for U/A Reflex to Micro - Reflex to  Culture:->Suprapubic Pain/Tenderness or Dysuria    Lipase [[id number]] Collected:  04/29/18 0940    Specimen:  Blood Updated:  04/29/18 1013     Lipase 13 U/L     Narrative:       Replace urinary catheter prior to obtaining the urine culture  if it has been in place for greater than or equal to 14  days:->N/A No Foley  Indications for U/A Reflex to Micro - Reflex to  Culture:->Suprapubic Pain/Tenderness or Dysuria     GFR [[id number]] Collected:  04/29/18 0940     Updated:  04/29/18 1013     EGFR >60.0    Narrative:       Replace urinary catheter prior  to obtaining the urine culture  if it has been in place for greater than or equal to 14  days:->N/A No Foley  Indications for U/A Reflex to Micro - Reflex to  Culture:->Suprapubic Pain/Tenderness or Dysuria    Comprehensive metabolic panel [[id number]]  (Abnormal) Collected:  04/29/18 0940    Specimen:  Blood Updated:  04/29/18 1013     Glucose 112 mg/dL      BUN 40.9 mg/dL      Creatinine 0.8 mg/dL      Sodium 811 mEq/L      Potassium 3.7 mEq/L      Chloride 98 mEq/L      CO2 24 mEq/L      Calcium 9.1 mg/dL      Protein, Total 9.2 g/dL      Albumin 4.4 g/dL      AST (SGOT) 18 U/L      ALT 15 U/L      Alkaline Phosphatase 63 U/L      Bilirubin, Total 0.3 mg/dL      Globulin 4.8 g/dL      Albumin/Globulin Ratio 0.9     Anion Gap 12.0    Narrative:       Replace urinary catheter prior to obtaining the urine culture  if it has been in place for greater than or equal to 14  days:->N/A No Foley  Indications for U/A Reflex to Micro - Reflex to  Culture:->Suprapubic Pain/Tenderness or Dysuria    Beta HCG, Qual, Serum [[id number]] Collected:  04/29/18 0940    Specimen:  Blood Updated:  04/29/18 1004     Hcg Qualitative Negative    Narrative:       Replace urinary catheter prior to obtaining the urine culture  if it has been in place for greater than or equal to 14  days:->N/A No Foley  Indications for U/A Reflex to Micro - Reflex to  Culture:->Suprapubic Pain/Tenderness or Dysuria    CBC and differential [[id number]]  (Abnormal) Collected:  04/29/18 0940    Specimen:  Blood Updated:  04/29/18 0955     WBC 7.44 x10 3/uL      Hgb 11.3 g/dL      Hematocrit 46.9 %      Platelets 304 x10 3/uL      RBC 4.08 x10 6/uL      MCV 86.5 fL      MCH 27.7 pg      MCHC 32.0 g/dL      RDW 16 %      MPV 10.3 fL      Neutrophils 69.1 %      Lymphocytes Automated 20.3 %      Monocytes 9.0 %      Eosinophils  Automated 0.5 %      Basophils Automated 0.8 %      Immature Granulocyte 0.3 %      Nucleated RBC 0.0 /100 WBC      Neutrophils Absolute 5.14 x10 3/uL      Abs Lymph Automated 1.51 x10 3/uL      Abs Mono Automated 0.67 x10 3/uL      Abs Eos Automated 0.04 x10 3/uL      Absolute Baso Automated 0.06 x10 3/uL      Absolute Immature Granulocyte 0.02 x10 3/uL      Absolute NRBC 0.00 x10 3/uL     Narrative:       Replace urinary catheter prior to obtaining the urine culture  if it has been  in place for greater than or equal to 14  days:->N/A No Foley  Indications for U/A Reflex to Micro - Reflex to  Culture:->Suprapubic Pain/Tenderness or Dysuria    Rapid Strep [[id number]] Collected:  04/29/18 0929    Specimen:  Throat Updated:  04/29/18 0950     Group A Strep, Rapid Antigen Negative    Influenza A/B Virus Antigen [[id number]] Collected:  04/29/18 0929    Specimen:  Nasopharyngeal from Nasal Aspirate Updated:  04/29/18 0950    Narrative:       ORDER#: [id number]                                    ORDERED BY: Tiajuana Amass  SOURCE: Nasal Aspirate                               COLLECTED:  04/29/18 09:29  ANTIBIOTICS AT COLL.:                                RECEIVED :  04/29/18 09:33  Influenza Rapid Antigen A&B                FINAL       04/29/18 09:50  04/29/18   Negative for Influenza A and B             Reference Range: Negative            Radiology Results (24 Hour)     Procedure Component Value Units Date/Time    XR Chest 2 Views [[id number]] Collected:  04/29/18 1037    Order Status:  Completed Updated:  04/29/18 1042    Narrative:       Clinical history: Cough.    Chest, PA and lateral were obtained. Comparison made to prior study  dated 01/03/2017.    FINDINGS:    The heart and mediastinal contour is within normal limits. The lungs are  clear bilaterally. No pneumothorax or pleural effusion is seen.      Impression:           No acute cardiopulmonary disease.    Georgiana Spinner, MD   04/29/2018 10:38 AM                        Procedures    Clinical Impression & Disposition     Clinical Impression  Final diagnoses:   Non-intractable vomiting with nausea, unspecified vomiting type   Weakness   Arthralgia, unspecified joint        ED Disposition     ED Disposition Condition Date/Time Comment    Discharge  Mon Apr 29, 2018 11:28 AM Chonda M Player discharge to home/self care.    Condition at disposition: Stable           Discharge Medication List as of 04/29/2018 11:28 AM      START taking these medications    Details   HYDROcodone-acetaminophen (NORCO) 5-325 MG per tablet Take 1 tablet by mouth every 6 (six) hours as needed for Pain, Starting Mon 04/29/2018, Print      promethazine (PHENERGAN) 25 MG tablet Take 1 tablet (25 mg total) by mouth every 8 (eight) hours as needed for Nausea, Starting Mon 04/29/2018, Print  Earline Mayotte, MD  04/29/18 [id number]

## 2018-04-29 NOTE — Discharge Instructions (Signed)
Vomiting    You have been seen for vomiting.    Vomiting (throwing-up) can be caused by many different things. Most of the time the cause IS NOT serious. The doctor feels it is OK for you to go home today.    Common causes of vomiting include the following:   Gastroenteritis (stomach flu), usually with diarrhea.   Other illnesses. Sometimes medical conditions like diabetes, heart problems, headaches, or infections can make someone throw up.    Bowel obstructions (blockages) can cause vomiting and make patients unable to have bowel movements (stool) or pass gas.   Vomiting can be a symptom of appendicitis, especially if there is also pain in the right lower abdomen (belly).    Sometimes it is hard to find out what is causing the vomiting. Vomiting can be treated with anti-nausea medicines like promethazine (Phenergan), prochlorperazine (Compazine) or ondansetron (Zofran).    Try to drink liquids to avoid dehydration. Dont drink a lot of fluid all at once. Take small sips throughout the day.    YOU SHOULD SEEK MEDICAL ATTENTION IMMEDIATELY, EITHER HERE OR AT THE NEAREST EMERGENCY DEPARTMENT, IF ANY OF THE FOLLOWING OCCURS:   You cant stop vomiting or your vomiting doesnt get better with medication.   You cannot keep liquids down.   You have severe sudden chest or belly pain after vomiting.   You have abdominal pain.          Arthralgia    You have been diagnosed with Arthralgia.    Arthralgia means pain and stiffness of the joints. People often describe the pain as aching or throbbing. Arthralgia can affect one or more joints. It can be caused by many types of conditions and/or injuries. Often, arthralgia lasts for a long time and people need treatment over months or years. Some causes of arthralgia are:     Infection with a virus.   Many types of infections that are starting to improve. When recovering from infection, sometimes there is joint pain.    Autoimmune diseases (where the body  attacks itself). Examples are Lupus or Rheumatoid Arthritis.   Inflammation of the tendons or the fluid-filled sacs (bursa) surrounding your joints.   Low thyroid function.   Depression.     You might need another exam or more tests to find out why you have arthralgias. At this time, the cause of your symptoms does not seem dangerous. You do not need to stay in the hospital.    We dont believe your condition is dangerous right now. However, you need to be careful. Sometimes a problem that seems small can get serious later. This is why it is very important to come back here or go to the nearest Emergency Department unless you are much improved.    Clues that joint pain is dangerous are:     Hot and swollen joints. This may mean they are infected.   Fever (Temperature higher than 100.50F or 38C), weight loss and feeling very ill can be symptoms of severe infection (sepsis).   Severe pain, weakness or numbness (loss of feeling).    Some things you can try at home are:     Over-the-counter pain medications.   Heating pads and warm baths.   Physical therapy.    Follow the instructions for any medication you get prescribed.     Have a close follow-up with your primary care doctor.    YOU SHOULD SEEK MEDICAL ATTENTION IMMEDIATELY, EITHER HERE OR AT THE NEAREST EMERGENCY  DEPARTMENT, IF ANY OF THE FOLLOWING OCCURS:     You have a fever (temperature higher than 100.71F or 38C).   Your pain does not go away or gets worse.   The joint that hurts turns red and/or gets swollen.   You suddenly can't walk.   You don't feel better after treatment or feel you're getting worse.   You get any other symptoms, concerns, or don't get better as expected.    If you can't follow up with your doctor, or if at any time you feel you need to be rechecked or seen again, come back here or go to the nearest emergency department.

## 2018-11-07 ENCOUNTER — Emergency Department: Payer: 59

## 2018-11-07 ENCOUNTER — Emergency Department
Admission: EM | Admit: 2018-11-07 | Discharge: 2018-11-07 | Disposition: A | Discharge status: Home / Self Care | Payer: 59 | Attending: Emergency Medicine | Admitting: Emergency Medicine

## 2018-11-07 DIAGNOSIS — R030 Elevated blood-pressure reading, without diagnosis of hypertension: Secondary | ICD-10-CM

## 2018-11-07 DIAGNOSIS — G35 Multiple sclerosis: Secondary | ICD-10-CM

## 2018-11-07 DIAGNOSIS — M79604 Pain in right leg: Secondary | ICD-10-CM

## 2018-11-07 LAB — IHS D-DIMER: D-Dimer: 0.45 ug/mL FEU (ref 0.00–0.50)

## 2018-11-07 LAB — GFR: EGFR: >60

## 2018-11-07 LAB — COMPREHENSIVE METABOLIC PANEL
ALT: 14 U/L (ref 0–55)
AST (SGOT): 17 U/L (ref 5–34)
Albumin/Globulin Ratio: 1 (ref 0.9–2.2)
Albumin: 4.4 g/dL (ref 3.5–5.0)
Alkaline Phosphatase: 55 U/L (ref 37–106)
Anion Gap: 14 (ref 5.0–15.0)
BUN: 8 mg/dL (ref 7.0–19.0)
Bilirubin, Total: 0.5 mg/dL (ref 0.2–1.2)
CO2: 21 mEq/L — ABNORMAL LOW (ref 22–29)
Calcium: 9.5 mg/dL (ref 8.5–10.5)
Chloride: 101 mEq/L (ref 100–111)
Creatinine: 0.7 mg/dL (ref 0.6–1.0)
Globulin: 4.3 g/dL — ABNORMAL HIGH (ref 2.0–3.6)
Glucose: 99 mg/dL (ref 70–100)
Potassium: 4.3 mEq/L (ref 3.5–5.1)
Protein, Total: 8.7 g/dL — ABNORMAL HIGH (ref 6.0–8.3)
Sodium: 136 mEq/L (ref 136–145)

## 2018-11-07 LAB — CBC AND DIFFERENTIAL
Absolute NRBC: 0 10*3/uL (ref 0.00–0.00)
Basophils Absolute Automated: 0.06 10*3/uL (ref 0.00–0.08)
Basophils Automated: 1 %
Eosinophils Absolute Automated: 0.02 10*3/uL (ref 0.00–0.44)
Eosinophils Automated: 0.3 %
Hematocrit: 38.8 % (ref 34.7–43.7)
Hgb: 12.5 g/dL (ref 11.4–14.8)
Immature Granulocytes Absolute: 0.01 10*3/uL (ref 0.00–0.07)
Immature Granulocytes: 0.2 %
Lymphocytes Absolute Automated: 1.91 10*3/uL (ref 0.42–3.22)
Lymphocytes Automated: 33.2 %
MCH: 28 pg (ref 25.1–33.5)
MCHC: 32.2 g/dL (ref 31.5–35.8)
MCV: 87 fL (ref 78.0–96.0)
MPV: 10.7 fL (ref 8.9–12.5)
Monocytes Absolute Automated: 0.49 10*3/uL (ref 0.21–0.85)
Monocytes: 8.5 %
Neutrophils Absolute: 3.26 10*3/uL (ref 1.10–6.33)
Neutrophils: 56.8 %
Nucleated RBC: 0 /100 WBC (ref 0.0–0.0)
Platelets: 290 10*3/uL (ref 142–346)
RBC: 4.46 10*6/uL (ref 3.90–5.10)
RDW: 16 % — ABNORMAL HIGH (ref 11–15)
WBC: 5.75 10*3/uL (ref 3.10–9.50)

## 2018-11-07 LAB — TROPONIN I: Troponin I: <0.01 ng/mL (ref 0.00–0.05)

## 2018-11-07 LAB — HCG, SERUM, QUALITATIVE: Hcg Qualitative: NEGATIVE

## 2018-11-07 MED ORDER — CYCLOBENZAPRINE HCL 10 MG PO TABS
10.0000 mg | ORAL_TABLET | Freq: Three times a day (TID) | ORAL | 0 refills | Status: DC | PRN
Start: 2018-11-07 — End: 2019-01-11

## 2018-11-07 MED ORDER — KETOROLAC TROMETHAMINE 15 MG/ML IJ SOLN
15.00 mg | Freq: Once | INTRAMUSCULAR | Status: AC
Start: 2018-11-07 — End: 2018-11-07
  Administered 2018-11-07: 15:00:00 15 mg via INTRAVENOUS
  Filled 2018-11-07: qty 1

## 2018-11-07 NOTE — Discharge Instructions (Signed)
Elevated Blood Pressure    During your visit today your blood pressure was higher than normal.    Check your blood pressure several times over the next several days, then follow up with your regular doctor. If you do not have a doctor, ask the medical staff to refer you to one.    You may need medication for your blood pressure if it stays high. Untreated high blood pressure can cause damage to your heart and kidneys and may lead to a heart attack or stroke. It is VERY IMPORTANT to follow up with your doctor.   Check your blood pressure daily and follow up with your doctor.   A doctor will diagnose high blood pressure only if your blood pressure is high for several days. Many pharmacies have machines that let you check your own blood pressure. You can also check with a fire station to see whether a paramedic will take your blood pressure. Another option is to purchase a blood pressure monitor to use at home. These are available at most pharmacies.     YOU SHOULD SEEK MEDICAL ATTENTION IMMEDIATELY, EITHER HERE OR AT THE NEAREST EMERGENCY DEPARTMENT, IF ANY OF THE FOLLOWING OCCURS:   You have a sudden or severe headache.   You are numb, tingly, or weak on one side of your body, half of your face droops, or you have trouble speaking.   You have chest pain.   You are short of breath.     Leg Pain    You have been diagnosed with leg pain.    Many things can cause leg pain. Most of the causes are not dangerous and will get better on their own. These can include muscle cramps, bruises, strains, pinched nerves, and minor skin infections.    You had an exam by your doctor today. He or she decided that the cause of your leg pain does not seem to be serious or dangerous.    During your visit, you had an ultrasound (duplex doppler) to make sure that you didn't have a blood clot in your leg.    If your pain continues, you might need another exam or more tests. This is to find out why you have leg pain. The cause  of your symptoms doesn't seem dangerous now. You don't need to stay in the hospital.    Though we dont believe your condition is dangerous right now, it is important to be careful. Sometimes a problem that seems mild can become serious later. This is why it is very important that you return here or go to the nearest Emergency Department if you are not improving or your symptoms are getting worse.    Some things you may try at home are:    Over-the-counter pain medications like that have ibuprofen (Advil/Motrin) or acetaminophen (Tylenol) in them. Follow the directions on the package.   Rest the leg as needed. As soon as you are able, start moving your leg again to keep it from getting stiff.    Return here or go to the nearest Emergency Department or follow-up with your doctor if you are not getting better as expected.    Follow the instructions for any medication you are prescribed.     YOU SHOULD SEEK MEDICAL ATTENTION IMMEDIATELY, EITHER HERE OR AT THE NEAREST EMERGENCY DEPARTMENT, IF ANY OF THE FOLLOWING HAPPENS:   You have a fever (temperature higher than 100.46F or 38C).   Your pain does not go away or gets worse.  Your leg or joints (hip, knee, ankle, etc.) are red or swollen.   Your chest hurts or you get short of breath.   You have any other symptoms or concerns, or don't get better as expected.    If you can't follow up with your doctor, or if at any time you feel you need to be rechecked or seen again, come back here or go to the nearest emergency department.                   Sedating Medication (Edu)    You have been given a medicine or prescription for medicine that causes drowsiness (sleepiness) or dizziness.    While taking this medicine:   Do not drink alcohol or take any take any tranquilizers or sleeping pills.   Do not drive or use heavy machinery.    Do not ride a bicycle.   Do not perform jobs where you need to be alert.   Do not make important decisions or sign any  legal documents.    After taking sedating medicine, you have a bigger risk of falling. You may need help with things you dont normally need help with. Falls can result in serious injury.     Do the following when taking this medicine to prevent a fall:   If you get dizzy, sit or lie down at the first signs. Get up slowly.   Be careful walking and going up and down stairs. Always ask for help.   Wear your eye glasses.    Use your assistive devices such as a cane or walker.     Never give this medicine to others.    Keep this medicine out of reach of children.    Do not take or save old medicines. Throw them away when expired. To learn how to safely throw away medications, go to: [employer]    Keep all medicines in a cool, dry place. Do not keep them in your bathroom medicine cabinet or in a cabinet above the stove.

## 2018-11-07 NOTE — ED Provider Notes (Signed)
History     Chief Complaint   Patient presents with    Leg Pain     right     43 y.o. with h/o MS who has been off her MS meds for a couple years presents to the ER with right leg pain for one month. She also feels like her muscle looks deformed. No trauma. The pain is mild and "dull." She has muscle spasms around her chest which she calls the "MS hug", but those are not new. No SOB. No cough. No fevers/chills. Nothing makes her leg pain better or worse.    The history is provided by the patient.   Leg Pain   Associated symptoms: no back pain and no fever             Past Medical History:   Diagnosis Date    Asthma     Breast lump 02/22/2017    painful lump left breast    Disorder of thyroid     MS (multiple sclerosis)     Pneumonia 2011, 2012    Sepsis 2011       Past Surgical History:   Procedure Laterality Date    CESAREAN SECTION      CHOLECYSTECTOMY      DEBRIDEMENT & IRRIGATION, ABSCESS  2015       Family History   Problem Relation Age of Onset    Cancer Father     Diabetes Maternal Aunt     Cancer Maternal Aunt     Breast cancer Maternal Aunt        Social  Social History     Tobacco Use    Smoking status: Never Smoker    Smokeless tobacco: Never Used   Substance Use Topics    Alcohol use: Yes     Comment: occasionally    Drug use: Yes     Types: Marijuana     Comment: for her MS- medical marijuana       .     Allergies   Allergen Reactions    Penicillins Hives       Home Medications     Med List Status:  In Progress Set By: Maura Crandall, LPN at 16/12/9602  2:30 PM                LEVOTHYROXINE SODIUM PO     Take by mouth           Flagged for Removal             celecoxib (CELEBREX) 200 MG capsule     Take 200 mg by mouth 2 (two) times daily     HYDROcodone-acetaminophen (NORCO) 5-325 MG per tablet     Take 1 tablet by mouth every 6 (six) hours as needed for Pain     naproxen (NAPROSYN) 250 MG tablet     Take 1 tablet (250 mg total) by mouth 2 (two) times daily with meals.      promethazine (PHENERGAN) 25 MG tablet     Take 1 tablet (25 mg total) by mouth every 8 (eight) hours as needed for Nausea           Review of Systems   Constitutional: Negative for chills and fever.   HENT: Negative for congestion, rhinorrhea and sore throat.    Respiratory: Negative for cough, chest tightness and shortness of breath.    Cardiovascular: Negative for chest pain and palpitations.   Gastrointestinal: Negative for abdominal pain, diarrhea, nausea and  vomiting.   Genitourinary: Negative for dysuria and frequency.   Musculoskeletal: Positive for gait problem and myalgias. Negative for back pain.   Skin: Negative for color change and rash.   Neurological: Negative for dizziness and headaches.   Psychiatric/Behavioral: Negative for confusion. The patient is not nervous/anxious.        Physical Exam    BP: (!) 168/104, Heart Rate: 90, Temp: 97.9 F (36.6 C), Resp Rate: 18, SpO2: 100 %, Weight: 98 kg    Physical Exam  Vitals signs and nursing note reviewed.   Constitutional:       Appearance: Normal appearance. She is well-developed.   HENT:      Head: Normocephalic and atraumatic.   Eyes:      Conjunctiva/sclera: Conjunctivae normal.      Pupils: Pupils are equal, round, and reactive to light.   Neck:      Musculoskeletal: Normal range of motion and neck supple.   Cardiovascular:      Rate and Rhythm: Normal rate and regular rhythm.      Heart sounds: Normal heart sounds.   Pulmonary:      Effort: Pulmonary effort is normal. No respiratory distress.      Breath sounds: Normal breath sounds. No wheezing or rales.   Abdominal:      General: There is no distension.      Palpations: Abdomen is soft.      Tenderness: There is no abdominal tenderness. There is no guarding or rebound.   Musculoskeletal: Normal range of motion.         General: No tenderness.      Comments: 2+ pedal pulses, indentation of right thigh, no obvious swelling, no rash   Skin:     General: Skin is warm and dry.   Neurological:       General: No focal deficit present.      Mental Status: She is alert and oriented to person, place, and time.      Cranial Nerves: No cranial nerve deficit.   Psychiatric:         Mood and Affect: Mood normal.         Behavior: Behavior normal.         Thought Content: Thought content normal.         Judgment: Judgment normal.           MDM and ED Course     ED Medication Orders (From admission, onward)    Start Ordered     Status Ordering Provider    11/07/18 1445 11/07/18 1432  ketorolac (TORADOL) injection 15 mg  Once     Route: Intravenous  Ordered Dose: 15 mg     Last MAR action:  Given Lasonja Lakins H             MDM  Number of Diagnoses or Management Options  Elevated blood pressure reading:   Multiple sclerosis:   Right leg pain:   Diagnosis management comments: Ddx: MS pain/muscle wasting, doubt clot nor infection  Plan: labs, u/s, obs    I, Nita Sells, M.D, have been the primary provider for Judy Freeman during this Emergency Dept visit.  Oxygen saturation by pulse oximetry is 95%-100%, Normal.  Interventions: None Needed.      ECG Interpretation by Nita Sells, MD, Emergency Physician:  Rate: Normal  Rhythm: Normal Sinus  Axis: Nl  Blocks: NONE  ST segments: normal  Q waves: NONE  Impression: Normal ECG  I reviewed with pt and spouse the importance of close f/u and return if worsening or any concerns. Will f/u with neurology.        Amount and/or Complexity of Data Reviewed  Clinical lab tests: ordered and reviewed  Tests in the radiology section of CPT: ordered and reviewed    Patient Progress  Patient progress: stable                  Radiology Results (24 Hour)     Procedure Component Value Units Date/Time    [place] VenoDopp Low Extremity Right [[id number]] Collected:  11/07/18 1534    Order Status:  Completed Updated:  11/07/18 1537    Narrative:       Clinical History: Right leg pain. Rule out DVT.    Comparison: None available.    Technique: Real-time sonographic imaging of the deep venous system of  the  right lower extremity was performed using color-flow, Doppler  waveform, manual graded compression, and flow augmentation techniques.  Common femoral, femoral, popliteal, and calf veins were evaluated.     Findings: All visualized deep venous structures demonstrate normal flow  and compressibility in the absence of intraluminal thrombus. Evaluation  of calf veins is somewhat limited by patient body habitus.      Impression:        No evidence of right lower extremity DVT.    Darra Lis, MD   11/07/2018 3:35 PM        Results     Procedure Component Value Units Date/Time    Troponin I [[id number]] Collected:  11/07/18 1444    Specimen:  Blood Updated:  11/07/18 1519     Troponin I <0.01 ng/mL     GFR [[id number]] Collected:  11/07/18 1444     Updated:  11/07/18 1519     EGFR >60.0    Comprehensive metabolic panel [[id number]]  (Abnormal) Collected:  11/07/18 1444    Specimen:  Blood Updated:  11/07/18 1519     Glucose 99 mg/dL      BUN 8.0 mg/dL      Creatinine 0.7 mg/dL      Sodium 629 mEq/L      Potassium 4.3 mEq/L      Chloride 101 mEq/L      CO2 21 mEq/L      Calcium 9.5 mg/dL      Protein, Total 8.7 g/dL      Albumin 4.4 g/dL      AST (SGOT) 17 U/L      ALT 14 U/L      Alkaline Phosphatase 55 U/L      Bilirubin, Total 0.5 mg/dL      Globulin 4.3 g/dL      Albumin/Globulin Ratio 1.0     Anion Gap 14.0    D-Dimer [[id number]] Collected:  11/07/18 1444     Updated:  11/07/18 1512     D-Dimer 0.45 ug/mL FEU     Beta HCG, Qual, Serum [[id number]] Collected:  11/07/18 1444    Specimen:  Blood Updated:  11/07/18 1505     Hcg Qualitative Negative    CBC and differential [[id number]]  (Abnormal) Collected:  11/07/18 1444    Specimen:  Blood Updated:  11/07/18 1457     WBC 5.75 x10 3/uL      Hgb 12.5 g/dL      Hematocrit 59.5 %      Platelets 290 x10 3/uL      RBC 4.46 x10 6/uL  MCV 87.0 fL      MCH 28.0 pg      MCHC 32.2 g/dL      RDW 16 %      MPV 10.7 fL      Neutrophils 56.8 %      Lymphocytes Automated 33.2 %       Monocytes 8.5 %      Eosinophils Automated 0.3 %      Basophils Automated 1.0 %      Immature Granulocytes 0.2 %      Nucleated RBC 0.0 /100 WBC      Neutrophils Absolute 3.26 x10 3/uL      Lymphocytes Absolute Automated 1.91 x10 3/uL      Monocytes Absolute Automated 0.49 x10 3/uL      Eosinophils Absolute Automated 0.02 x10 3/uL      Basophils Absolute Automated 0.06 x10 3/uL      Immature Granulocytes Absolute 0.01 x10 3/uL      Absolute NRBC 0.00 x10 3/uL             Procedures    Clinical Impression & Disposition     Clinical Impression  Final diagnoses:   Right leg pain   Multiple sclerosis   Elevated blood pressure reading        ED Disposition     ED Disposition Condition Date/Time Comment    Discharge  Thu Nov 07, 2018  3:43 PM Judy Freeman discharge to home/self care.    Condition at disposition: Stable           Discharge Medication List as of 11/07/2018  3:43 PM      START taking these medications    Details   cyclobenzaprine (FLEXERIL) 10 MG tablet Take 1 tablet (10 mg total) by mouth 3 (three) times daily as needed for Muscle spasms, Starting Thu 11/07/2018, Normal                       Leticia Clas, MD  11/07/18 1754

## 2018-11-08 LAB — ECG 12-LEAD
Atrial Rate: 81 {beats}/min
P Axis: 45 degrees
P-R Interval: 138 ms
Q-T Interval: 384 ms
QRS Duration: 80 ms
QTC Calculation (Bezet): 446 ms
R Axis: 27 degrees
T Axis: 18 degrees
Ventricular Rate: 81 {beats}/min

## 2019-01-10 ENCOUNTER — Inpatient Hospital Stay
Admission: EM | Admit: 2019-01-10 | Discharge: 2019-01-13 | DRG: 060 | Disposition: A | Discharge status: Home / Self Care | Payer: 59 | Attending: Internal Medicine | Admitting: Internal Medicine

## 2019-01-10 DIAGNOSIS — M545 Low back pain: Secondary | ICD-10-CM | POA: Diagnosis present

## 2019-01-10 DIAGNOSIS — G8929 Other chronic pain: Secondary | ICD-10-CM | POA: Diagnosis present

## 2019-01-10 DIAGNOSIS — Z8701 Personal history of pneumonia (recurrent): Secondary | ICD-10-CM

## 2019-01-10 DIAGNOSIS — J4520 Mild intermittent asthma, uncomplicated: Secondary | ICD-10-CM

## 2019-01-10 DIAGNOSIS — D649 Anemia, unspecified: Secondary | ICD-10-CM | POA: Diagnosis present

## 2019-01-10 DIAGNOSIS — M7531 Calcific tendinitis of right shoulder: Secondary | ICD-10-CM | POA: Diagnosis present

## 2019-01-10 DIAGNOSIS — Z9114 Patient's other noncompliance with medication regimen: Secondary | ICD-10-CM

## 2019-01-10 DIAGNOSIS — Z7401 Bed confinement status: Secondary | ICD-10-CM

## 2019-01-10 DIAGNOSIS — Z79899 Other long term (current) drug therapy: Secondary | ICD-10-CM

## 2019-01-10 DIAGNOSIS — Z79890 Hormone replacement therapy (postmenopausal): Secondary | ICD-10-CM

## 2019-01-10 DIAGNOSIS — G43909 Migraine, unspecified, not intractable, without status migrainosus: Secondary | ICD-10-CM | POA: Diagnosis present

## 2019-01-10 DIAGNOSIS — G35 Multiple sclerosis: Principal | ICD-10-CM | POA: Diagnosis present

## 2019-01-10 DIAGNOSIS — E039 Hypothyroidism, unspecified: Secondary | ICD-10-CM | POA: Diagnosis present

## 2019-01-10 DIAGNOSIS — J45909 Unspecified asthma, uncomplicated: Secondary | ICD-10-CM | POA: Diagnosis present

## 2019-01-10 DIAGNOSIS — D6489 Other specified anemias: Secondary | ICD-10-CM

## 2019-01-10 DIAGNOSIS — Z9119 Patient's noncompliance with other medical treatment and regimen: Secondary | ICD-10-CM

## 2019-01-10 HISTORY — DX: Calcific tendinitis, unspecified site: M65.20

## 2019-01-10 HISTORY — DX: Other intervertebral disc degeneration, lumbar region: M51.36

## 2019-01-10 LAB — CBC AND DIFFERENTIAL
Absolute NRBC: 0 10*3/uL (ref 0.00–0.00)
Basophils Absolute Automated: 0.05 10*3/uL (ref 0.00–0.08)
Basophils Automated: 0.7 %
Eosinophils Absolute Automated: 0.05 10*3/uL (ref 0.00–0.44)
Eosinophils Automated: 0.7 %
Hematocrit: 35.3 % (ref 34.7–43.7)
Hgb: 11.2 g/dL — ABNORMAL LOW (ref 11.4–14.8)
Immature Granulocytes Absolute: 0.01 10*3/uL (ref 0.00–0.07)
Immature Granulocytes: 0.1 %
Lymphocytes Absolute Automated: 3.11 10*3/uL (ref 0.42–3.22)
Lymphocytes Automated: 44.9 %
MCH: 28.6 pg (ref 25.1–33.5)
MCHC: 31.7 g/dL (ref 31.5–35.8)
MCV: 90.1 fL (ref 78.0–96.0)
MPV: 10.2 fL (ref 8.9–12.5)
Monocytes Absolute Automated: 0.5 10*3/uL (ref 0.21–0.85)
Monocytes: 7.2 %
Neutrophils Absolute: 3.21 10*3/uL (ref 1.10–6.33)
Neutrophils: 46.4 %
Nucleated RBC: 0 /100 WBC (ref 0.0–0.0)
Platelets: 282 10*3/uL (ref 142–346)
RBC: 3.92 10*6/uL (ref 3.90–5.10)
RDW: 14 % (ref 11–15)
WBC: 6.93 10*3/uL (ref 3.10–9.50)

## 2019-01-10 LAB — CK: Creatine Kinase (CK): 94 U/L (ref 29–168)

## 2019-01-10 LAB — COMPREHENSIVE METABOLIC PANEL
ALT: 16 U/L (ref 0–55)
AST (SGOT): 15 U/L (ref 5–34)
Albumin/Globulin Ratio: 1.1 (ref 0.9–2.2)
Albumin: 4.2 g/dL (ref 3.5–5.0)
Alkaline Phosphatase: 57 U/L (ref 37–106)
Anion Gap: 12 (ref 5.0–15.0)
BUN: 9 mg/dL (ref 7.0–19.0)
Bilirubin, Total: 0.3 mg/dL (ref 0.2–1.2)
CO2: 25 mEq/L (ref 22–29)
Calcium: 8.5 mg/dL (ref 8.5–10.5)
Chloride: 102 mEq/L (ref 100–111)
Creatinine: 0.8 mg/dL (ref 0.6–1.0)
Globulin: 3.9 g/dL — ABNORMAL HIGH (ref 2.0–3.6)
Glucose: 110 mg/dL — ABNORMAL HIGH (ref 70–100)
Potassium: 3.6 mEq/L (ref 3.5–5.1)
Protein, Total: 8.1 g/dL (ref 6.0–8.3)
Sodium: 139 mEq/L (ref 136–145)

## 2019-01-10 LAB — GFR: EGFR: >60

## 2019-01-10 MED ORDER — KETOROLAC TROMETHAMINE 30 MG/ML IJ SOLN
30.00 mg | Freq: Once | INTRAMUSCULAR | Status: AC
Start: 2019-01-10 — End: 2019-01-10
  Administered 2019-01-10: 23:00:00 30 mg via INTRAVENOUS
  Filled 2019-01-10: qty 1

## 2019-01-10 MED ORDER — METHYLPREDNISOLONE SODIUM SUCC 125 MG IJ SOLR
125.00 mg | Freq: Once | INTRAMUSCULAR | Status: AC
Start: 2019-01-10 — End: 2019-01-10
  Administered 2019-01-10: 23:00:00 125 mg via INTRAVENOUS
  Filled 2019-01-10: qty 2

## 2019-01-10 MED ORDER — ONDANSETRON HCL 4 MG/2ML IJ SOLN
4.00 mg | Freq: Once | INTRAMUSCULAR | Status: AC
Start: 2019-01-11 — End: 2019-01-10
  Administered 2019-01-10: 4 mg via INTRAVENOUS
  Filled 2019-01-10: qty 2

## 2019-01-10 MED ORDER — FENTANYL CITRATE (PF) 50 MCG/ML IJ SOLN (WRAP)
100.00 ug | Freq: Once | INTRAMUSCULAR | Status: AC
Start: 2019-01-10 — End: 2019-01-10
  Administered 2019-01-10: 23:00:00 100 ug via INTRAVENOUS
  Filled 2019-01-10: qty 2

## 2019-01-10 MED ORDER — SODIUM CHLORIDE 0.9 % IV BOLUS
1000.00 mL | Freq: Once | INTRAVENOUS | Status: AC
Start: 2019-01-10 — End: 2019-01-11
  Administered 2019-01-10: 23:00:00 1000 mL via INTRAVENOUS

## 2019-01-10 NOTE — ED Provider Notes (Signed)
History     Chief Complaint   Patient presents with    Muscle Weakness    Pain     43 year old female with history of MS presents with MS exacerbation symptoms.  Patient weakness and pain was slow onset, started about 2 to 3 months ago and gradually worsening, initially mild to moderate, constant, nothing makes it better-not in any medication for it at this time, nothing makes it worse.  States is almost bedridden for the past 2 to 3 weeks.  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  The calcific tendinitis and herniated disc are being evaluated currently by physicians.  She was referred to [facility] by her primary care doctor but has not been able to get a appointment at this time.  Denies f/c/n/v/d/c/chest pain/sob/photophobia/rash/focal neuro deficits--had complained of some buttock and finger intermittent numbness but none now/loc/neck pain/recent exposure to anyone with COVID-19    The history is provided by the patient, a relative and medical records. No language interpreter was used.        Past Medical History:   Diagnosis Date    Asthma     Breast lump 02/22/2017    painful lump left breast    Bulging lumbar disc     Calcific tendinitis     Disorder of thyroid     MS (multiple sclerosis)     Pneumonia 2011, 2012    Sepsis 2011       Past Surgical History:   Procedure Laterality Date    CESAREAN SECTION      CHOLECYSTECTOMY      DEBRIDEMENT & IRRIGATION, ABSCESS  2015       Family History   Problem Relation Age of Onset    Cancer Father     Diabetes Maternal Aunt     Cancer Maternal Aunt     Breast cancer Maternal Aunt        Social  Social History     Tobacco Use    Smoking status: Never Smoker    Smokeless tobacco: Never Used   Substance Use Topics    Alcohol use: Yes     Comment: occasionally    Drug use: Not Currently     Types: Marijuana     Comment: for her MS- medical marijuana        .     Allergies   Allergen Reactions    Penicillins Hives       Home Medications     Med List Status: In Progress Set By: Briant Cedar, RN at 01/10/2019 10:39 PM                LEVOTHYROXINE SODIUM PO     Take by mouth           Flagged for Removal             celecoxib (CELEBREX) 200 MG capsule     Take 200 mg by mouth 2 (two) times daily     cyclobenzaprine (FLEXERIL) 10 MG tablet     Take 1 tablet (10 mg total) by mouth 3 (three) times daily as needed for Muscle spasms     HYDROcodone-acetaminophen (NORCO) 5-325 MG per tablet     Take 1 tablet by mouth every 6 (six) hours as needed for Pain     naproxen (NAPROSYN) 250 MG tablet     Take 1 tablet (250  mg total) by mouth 2 (two) times daily with meals.     promethazine (PHENERGAN) 25 MG tablet     Take 1 tablet (25 mg total) by mouth every 8 (eight) hours as needed for Nausea           Review of Systems   Constitutional: Negative for chills and fever.   HENT: Negative for rhinorrhea and sore throat.    Eyes: Negative for photophobia and discharge.   Respiratory: Negative for cough and shortness of breath.    Cardiovascular: Negative for chest pain and palpitations.   Gastrointestinal: Negative for abdominal pain, diarrhea, nausea and vomiting.   Genitourinary: Negative for dysuria, frequency and hematuria.   Musculoskeletal: Positive for back pain and myalgias. Negative for neck pain.   Neurological: Positive for numbness and headaches. Negative for dizziness, syncope, weakness and light-headedness.   Psychiatric/Behavioral: Negative for confusion and suicidal ideas.       Physical Exam    BP: (!) 169/99, Heart Rate: 96, Temp: 97.4 F (36.3 C), Resp Rate: 16, SpO2: 100 %, Weight: 98.2 kg    Physical Exam  Vitals signs and nursing note reviewed.   Constitutional:       General: She is not in acute distress.     Appearance: She is well-developed. She is not toxic-appearing or diaphoretic.   HENT:      Head: Normocephalic and atraumatic.      Nose: Nose normal.       Mouth/Throat:      Mouth: Mucous membranes are dry.      Pharynx: No oropharyngeal exudate.   Eyes:      General: Lids are normal.         Right eye: No discharge.         Left eye: No discharge.      Conjunctiva/sclera: Conjunctivae normal.      Right eye: Right conjunctiva is not injected. No exudate.     Left eye: Left conjunctiva is not injected. No exudate.     Pupils: Pupils are equal, round, and reactive to light.   Neck:      Musculoskeletal: Full passive range of motion without pain, normal range of motion and neck supple. Normal range of motion. No spinous process tenderness or muscular tenderness.      Vascular: No carotid bruit or JVD.      Trachea: No tracheal tenderness.   Cardiovascular:      Rate and Rhythm: Normal rate and regular rhythm.      Pulses: Normal pulses.      Heart sounds: Normal heart sounds.   Pulmonary:      Effort: Pulmonary effort is normal. No respiratory distress.      Breath sounds: Normal breath sounds. No stridor. No decreased breath sounds, wheezing, rhonchi or rales.   Chest:      Chest wall: No tenderness.   Abdominal:      General: Bowel sounds are normal. There is no distension.      Palpations: Abdomen is soft. There is no mass.      Tenderness: There is no abdominal tenderness. There is no right CVA tenderness, left CVA tenderness, guarding or rebound.      Hernia: No hernia is present.   Musculoskeletal:         General: Tenderness present. No swelling, deformity or signs of injury.        Back:       Right lower leg: No edema.  Left lower leg: No edema.      Comments: No calf tenderness, no Homman's sign   Skin:     General: Skin is warm.      Coloration: Skin is not pale.      Findings: No rash.   Neurological:      General: No focal deficit present.      Mental Status: She is alert and oriented to person, place, and time.      GCS: GCS eye subscore is 4. GCS verbal subscore is 5. GCS motor subscore is 6.      Cranial Nerves: Cranial nerves are intact. No  cranial nerve deficit.      Sensory: Sensation is intact. No sensory deficit.      Motor: Motor function is intact.      Coordination: Coordination is intact. Coordination normal.      Deep Tendon Reflexes: Reflexes are normal and symmetric.      Reflex Scores:       Patellar reflexes are 2+ on the right side and 2+ on the left side.  Psychiatric:         Speech: Speech normal.         Behavior: Behavior normal.         Thought Content: Thought content normal.         Judgment: Judgment normal.           MDM and ED Course     ED Medication Orders (From admission, onward)    Start Ordered     Status Ordering Provider    01/10/19 2300 01/10/19 2258  sodium chloride 0.9 % bolus 1,000 mL  Once     Route: Intravenous  Ordered Dose: 1,000 mL     Ordered Atalya Dano R    01/10/19 2300 01/10/19 2258  ketorolac (TORADOL) injection 30 mg  Once     Route: Intravenous  Ordered Dose: 30 mg     Verlee Monte, Estrella Alcaraz R    01/10/19 2300 01/10/19 2258  fentaNYL (PF) (SUBLIMAZE) injection 100 mcg  Once     Route: Intravenous  Ordered Dose: 100 mcg     Verlee Monte, Zamariya Neal R    01/10/19 2300 01/10/19 2258  methylPREDNISolone sodium succinate (Solu-MEDROL) injection 125 mg  Once     Route: Intravenous  Ordered Dose: 125 mg     Ordered Shereece Wellborn R             MDM  Number of Diagnoses or Management Options  Anemia due to other cause, not classified:   Multiple sclerosis exacerbation:   Diagnosis management comments: I, Janese Banks, have assumed care of patient.  I have completed his/her evaluation, reviewed all pertinent data, and determined his final disposition.    " *This note was generated by the Epic EMR system/ Dragon speech recognition and may contain inherent errors or omissions not intended by the user. Grammatical errors, random word insertions, deletions, pronoun errors and incomplete sentences are occasional consequences of this technology due to software limitations. Not all errors are caught or corrected. If there  are questions or concerns about the content of this note or information contained within the body of this dictation they should be addressed directly with the author for clarification."      Oxygen saturation by pulse oximetry is 95%-100%, Normal.  Interventions: None Needed.     I reviewed all labs and/or radiology studies.     I reviewed old medical records including  I reviewed nursing recorded vitals and history including [id number]         When I was within 6 feet of this patient I donned the following PPE:  Surgical Mask Yes, Gloves Yes, Gown No  ; Goggles Yes; Face Shield No  , N95 or respirator Yes.  The patient was wearing a mask during my evaluation Yes.    Differential diagnosis: MS exasperation, dehydration, electrolyte imbalance, UTI, stroke-unlikely, chronic pain    Labs, meds, IV fluids, reeval    WBC within normal limits without left shift.  Electrolytes within normal limits.  Urinalysis pending.  Patient vomited once secondary to likely narcotics.  Given Zofran.  Patient given Solu-Medrol and will patient in hospital for treatment of MS exacerbation.  Discussed study results and treatment plan with patient and family.     Discussed case with Dr. Leonie Man who will write orders to place patient in the hospital.                   Procedures    Clinical Impression & Disposition     Clinical Impression  Final diagnoses:   Multiple sclerosis exacerbation   Anemia due to other cause, not classified        ED Disposition     ED Disposition Condition Date/Time Comment    Admit to [facility]  Fri Jan 10, 2019 11:58 PM            New Prescriptions    No medications on file                 Janese Banks, MD  01/10/19 2359

## 2019-01-10 NOTE — H&P (Addendum)
[facility]  H&P    Patient Info:   Date/Time: 01/11/2019 / 5:19 AM   Admit Date:01/10/2019  Patient Name:Judy Freeman   ZOX:[id number]   PCP: Hurley Cisco, MD  Attending Physician:Milla Wahlberg, MD     Assessment and Plan:     1. Multiple sclerosis exacerbation  She was given Solu-Medrol x1 in the ER.  Will defer on further steroids to neurology.  --Noncompliant with her multiple sclerosis medications x5 years.  --Flareup was 4 years ago reportedly.  --The brain is showing Last MRI brain was 1/20:" Findings consistent with the given diagnosis of demyelinating disease. There has been progression comparing to the prior study from 2016. No enhancing lesions are seen"  --Consult neurology in the morning.  She states she is in between neurologists and wants to follow-up at [facility].  --MS in [facility]   --Neurology [place]  --CRP, CBC, CMP, MG, ESR. TSH    2.History of --MS in [facility]   --Neurology [place]: Stable and chronic.    3.Right shoulder tendinitis: She is in physical therapy for this.    Hospital Problems:  Principal Problem:    Multiple sclerosis exacerbation  Active Problems:    Asthma    Right shoulder pain    DVT Prohylaxis:SEDs   Code Status: Full Code  Disposition:home  Type of Admission:Observation  Estimated Length of Stay (including stay in the ER receiving treatment): greater than 2 midnights  Foley and Central Lines: none  Medical Necessity for stay:see above  Milestones: see above    Clinical Presentation:   History of Presenting Illness:     This is a 43 year old female with a past medical history as listed above who comes in today complaining of generalized body pain.  She states she relapsed at the end of September.  She has had generalized weakness and fatigue over her entire arms and legs.  She states the pain is been relentless and severe and burning.  She has right pain and has been diagnosed with tendinitis therefore is been in physical therapy.  She  has had a constant headache with numbness on bilateral fingers and toes.  She feels like she has been having trouble with her speech over the last 1 month--have trouble understanding her sometimes.  She has black dots and floaters in the vision.  She has had palpitations because she cries due to severe pain--occasionally has emesis after crying severely.  She has the urge to pee but denies any dysuria, frequency, urgency or other.  She states she is been bedbound for the symptoms over the last 3 weeks.  She is able to ambulate but stays in bed most the day.  He is not taking anything for pain at home.  She stopped taking all her multiple sclerosis medications 5 years ago because "I did not want to feel like a [place] pig" she is been off medication since.  She last had an MRI with neurology in January which showed progression of her disease--is in process of switching to a [facility] physician.   Denies any chest pain, shortness of breath, nausea, vomiting, diarrhea, urinary complaints, melena, or focal neurological deficits.     Review of Systems:   A comprehensive review of systems was negative except for the underlying highlighted in Bold     Constitutional: chills, weight loss, malaise/fatigue and diaphoresis. Head to toe pain  HENT: hearing loss, ear pain, nosebleeds, congestion, sore throat, neck pain, tinnitus and ear discharge.  Eyes: blurred vision, double vision, photophobia, pain, discharge and redness.   Respiratory: cough, hemoptysis, sputum production, shortness of breath, wheezing and stridor.    Cardiovascular: chest pain, palpitations, orthopnea, claudication, leg swelling and PND.   Gastrointestinal: heartburn, nausea, vomiting, abdominal pain, diarrhea, constipation, blood in stool and melena.   Genitourinary: dysuria, urgency, frequency, hematuria and flank pain.   Musculoskeletal: myalgias, back pain, joint pain and falls.   Skin: itching and rash.   Neurological: dizziness, tingling,  tremors, sensory change, speech change, focal weakness, seizures, loss of consciousness, weakness and headaches.   Endo/Heme/Allergies: environmental allergies and polydipsia. Does not bruise/bleed easily.   Psychiatric/Behavioral: depression, suicidal ideas, hallucinations, memory loss and substance abuse. The patient is not nervous/anxious and does not have insomnia.      Physical Exam:     Vitals:    01/11/19 0100 01/11/19 0200 01/11/19 0253 01/11/19 0340   BP: (!) 161/92 156/86 145/73 129/82   Pulse:   82 93   Resp:   16 16   Temp:   97.7 F (36.5 C) 97.6 F (36.4 C)   TempSrc:   Temporal Oral   SpO2: 100% 98% 98% 96%   Weight:       Height:           Constitutional: Patient is oriented to person, place, and time. Patient appears well-developed and well-nourished.   Head: Normocephalic and atraumatic.  Eyes- pupils equal and reactive, extraocular eye movements intact, sclera anicteric  Ears - external ear canals normal, right ear normal, left ear normal  Nose - normal and patent, no erythema, discharge or polyps and normal nontender sinuses  Mouth - mucous membranes moist, pharynx normal without lesions  Neck: Normal range of motion. Neck supple. No JVD present. No bruit or thrill noted on bilateral carotids. No tracheal deviation present. No thyromegaly present.   Cardiovascular: Normal rate, regular rhythm, normal heart sounds and intact distal pulses.  Exam reveals no gallop and no friction rub. No murmur heard.  Pulmonary/Chest: Effort normal and breath sounds normal. No stridor. No respiratory distress. Patient has no wheezes. No crackles were present.  Exhibits no tenderness on palpation.   Abdominal: Soft. Bowel sounds are normal. Patient exhibits no distension and no mass was palpable. There is no tenderness. There is no rebound and no guarding.   Musculoskeletal: Normal range of motion. Patient exhibits no edema and no tenderness.   Right shoulder pain (chronic)  Lymphadenopathy:  Patient has no  cervical adenopathy.   Neurological: Patient is alert and oriented to person, place, and time and has normal reflexes. No cranial nerve deficit.  Normal muscle tone. Coordination normal.   bil numbness to hands and toes, legs are +4/5  +HA  Skin: Skin is warm. No rash noted. Patient is not diaphoretic. No erythema. No pallor.   Psychiatric: Has normal mood and affect. Behavior is normal. Judgment and thought content normal    Physical Exam    Clinical Information and History:   Chief Complaint:  Chief Complaint   Patient presents with    Muscle Weakness    Pain     Past Medical History:  Past Medical History:   Diagnosis Date    Asthma     Breast lump 02/22/2017    painful lump left breast    Bulging lumbar disc     Calcific tendinitis     Disorder of thyroid     MS (multiple sclerosis)     Pneumonia 2011, 2012  Sepsis 2011     Past Surgical History:  Past Surgical History:   Procedure Laterality Date    CESAREAN SECTION      CHOLECYSTECTOMY      DEBRIDEMENT & IRRIGATION, ABSCESS  2015     Family History:  Family History   Problem Relation Age of Onset    Cancer Father     Diabetes Maternal Aunt     Cancer Maternal Aunt     Breast cancer Maternal Aunt      Social History:  Social History     Substance and Sexual Activity   Alcohol Use Yes    Comment: occasionally     Social History     Substance and Sexual Activity   Drug Use Not Currently    Types: Marijuana    Comment: for her MS- medical marijuana     Social History     Tobacco Use   Smoking Status Never Smoker   Smokeless Tobacco Never Used     Social History     Socioeconomic History    Marital status: Single     Spouse name: None    Number of children: None    Years of education: None    Highest education level: None   Occupational History    None   Social Engineer, site strain: None    Food insecurity     Worry: None     Inability: None    Transportation needs     Medical: None     Non-medical: None   Tobacco Use     Smoking status: Never Smoker    Smokeless tobacco: Never Used   Substance and Sexual Activity    Alcohol use: Yes     Comment: occasionally    Drug use: Not Currently     Types: Marijuana     Comment: for her MS- medical marijuana    Sexual activity: Yes   Lifestyle    Physical activity     Days per week: None     Minutes per session: None    Stress: None   Relationships    Social connections     Talks on phone: None     Gets together: None     Attends religious service: None     Active member of club or organization: None     Attends meetings of clubs or organizations: None     Relationship status: None    Intimate partner violence     Fear of current or ex partner: None     Emotionally abused: None     Physically abused: None     Forced sexual activity: None   Other Topics Concern    None   Social History Narrative    None     Allergies:  Allergies   Allergen Reactions    Penicillins Hives     Medications:  Medications Prior to Admission   Medication Sig Dispense Refill Last Dose    Cholecalciferol (Vitamin D3) 50 MCG (2000 UT) Tab Take by mouth   01/08/2019    ibuprofen (ADVIL) 800 MG tablet Take 800 mg by mouth every 6 (six) hours as needed for Pain   01/09/2019    LEVOTHYROXINE SODIUM PO Take by mouth   01/10/2019 at Unknown time    Multiple Vitamin (multivitamin) capsule Take 1 capsule by mouth daily   01/08/2019     Results of Labs/imaging   Labs have been reviewed:  Coagulation Profile:       CBC review:   Recent Labs   Lab 01/10/19  2318   WBC 6.93   Hgb 11.2*   Hematocrit 35.3   Platelets 282   MCV 90.1   RDW 14   Neutrophils 46.4   Lymphocytes Automated 44.9   Eosinophils Automated 0.7   Immature Granulocytes 0.1   Neutrophils Absolute 3.21   Immature Granulocytes Absolute 0.01     Chem Review:  Recent Labs   Lab 01/10/19  2318   Sodium 139   Potassium 3.6   Chloride 102   CO2 25   BUN 9.0   Creatinine 0.8   Glucose 110*   Calcium 8.5   Bilirubin, Total 0.3   AST (SGOT) 15   ALT 16    Alkaline Phosphatase 57     Results     Procedure Component Value Units Date/Time    Urinalysis Reflex to Microscopic Exam- Reflex to Culture [[id number]]  (Abnormal) Collected: 01/11/19 0222     Updated: 01/11/19 0236     Urine Type clan catch     Color, UA Yellow     Clarity, UA Clear     Specific Gravity UA 1.025     Urine pH 6.0     Leukocyte Esterase, UA Negative     Nitrite, UA Negative     Protein, UR Trace     Glucose, UA Negative     Ketones UA Negative     Urobilinogen, UA 0.2 mg/dL      Bilirubin, UA Small     Blood, UA Moderate     RBC, UA 3 - 5 /hpf      WBC, UA 0 - 5 /hpf      Squamous Epithelial Cells, Urine 0 - 5 /hpf     Narrative:      Replace urinary catheter prior to obtaining the urine culture  if it has been in place for greater than or equal to 14  days:->N/A No Foley  Indications for U/A Reflex to Micro - Reflex to  Culture:->Suprapubic Pain/Tenderness or Dysuria  Rescheduled by [id number] at 01/10/2019 23:19 Reason: Patient unavailable    Comprehensive metabolic panel [[id number]]  (Abnormal) Collected: 01/10/19 2318    Specimen: Blood Updated: 01/10/19 2341     Glucose 110 mg/dL      BUN 9.0 mg/dL      Creatinine 0.8 mg/dL      Sodium 621 mEq/L      Potassium 3.6 mEq/L      Chloride 102 mEq/L      CO2 25 mEq/L      Calcium 8.5 mg/dL      Protein, Total 8.1 g/dL      Albumin 4.2 g/dL      AST (SGOT) 15 U/L      ALT 16 U/L      Alkaline Phosphatase 57 U/L      Bilirubin, Total 0.3 mg/dL      Globulin 3.9 g/dL      Albumin/Globulin Ratio 1.1     Anion Gap 12.0    Narrative:      Replace urinary catheter prior to obtaining the urine culture  if it has been in place for greater than or equal to 14  days:->N/A No Foley  Indications for U/A Reflex to Micro - Reflex to  Culture:->Suprapubic Pain/Tenderness or Dysuria    Creatine Kinase (CK) [[id number]] Collected: 01/10/19 2318    Specimen: Blood Updated: 01/10/19 2341  Creatine Kinase (CK) 94 U/L     Narrative:      Replace urinary catheter prior to  obtaining the urine culture  if it has been in place for greater than or equal to 14  days:->N/A No Foley  Indications for U/A Reflex to Micro - Reflex to  Culture:->Suprapubic Pain/Tenderness or Dysuria    GFR [[id number]] Collected: 01/10/19 2318     Updated: 01/10/19 2341     EGFR >60.0    Narrative:      Replace urinary catheter prior to obtaining the urine culture  if it has been in place for greater than or equal to 14  days:->N/A No Foley  Indications for U/A Reflex to Micro - Reflex to  Culture:->Suprapubic Pain/Tenderness or Dysuria    CBC and differential [[id number]]  (Abnormal) Collected: 01/10/19 2318    Specimen: Blood Updated: 01/10/19 2324     WBC 6.93 x10 3/uL      Hgb 11.2 g/dL      Hematocrit 74.2 %      Platelets 282 x10 3/uL      RBC 3.92 x10 6/uL      MCV 90.1 fL      MCH 28.6 pg      MCHC 31.7 g/dL      RDW 14 %      MPV 10.2 fL      Neutrophils 46.4 %      Lymphocytes Automated 44.9 %      Monocytes 7.2 %      Eosinophils Automated 0.7 %      Basophils Automated 0.7 %      Immature Granulocytes 0.1 %      Nucleated RBC 0.0 /100 WBC      Neutrophils Absolute 3.21 x10 3/uL      Lymphocytes Absolute Automated 3.11 x10 3/uL      Monocytes Absolute Automated 0.50 x10 3/uL      Eosinophils Absolute Automated 0.05 x10 3/uL      Basophils Absolute Automated 0.05 x10 3/uL      Immature Granulocytes Absolute 0.01 x10 3/uL      Absolute NRBC 0.00 x10 3/uL     Narrative:      Replace urinary catheter prior to obtaining the urine culture  if it has been in place for greater than or equal to 14  days:->N/A No Foley  Indications for U/A Reflex to Micro - Reflex to  Culture:->Suprapubic Pain/Tenderness or Dysuria        Radiology reports have been reviewed:  Radiology Results (24 Hour)     ** No results found for the last 24 hours. **        EKG: EKG reviewed   Last EKG Result     None        Hospitalist   Signed by:   Unk Lightning  01/11/2019 5:19 AM      *Due to pandemic of coronavirus and based on  hospital policies and procedures ; this document may contain, but not limited to, information based on direct patient contact, observation , telephone conversation, use of A/V devices , medical records, conversation with other treatment team members, information and physical exam by other treatment team members.  Not all errors are caught or corrected. If there are questions or concerns about the content of this note or information contained within the body of this document they should be addressed directly with the author for clarification.    *This note was generated by the  [employer]MR system/ Dragon speech recognition and may contain inherent errors or omissions not intended by the user. Grammatical errors, random word insertions, deletions, pronoun errors and incomplete sentences are occasional consequences of this technology due to software limitations. Not all errors are caught or corrected. If there are questions or concerns about the content of this note or information contained within the body of this dictation they should be addressed directly with the author for clarification      I have directly reviewed the clinical findings, lab, imaging studies and management of this patient in detail. I have interviewed and examined the pt and agree with the documentation,  as recorded by NP.    CC:  Chief Complaint   Patient presents with    Muscle Weakness    Pain         Labs:   Results     Procedure Component Value Units Date/Time    Urinalysis Reflex to Microscopic Exam- Reflex to Culture [[id number]]  (Abnormal) Collected: 01/11/19 0222     Updated: 01/11/19 0236     Urine Type clan catch     Color, UA Yellow     Clarity, UA Clear     Specific Gravity UA 1.025     Urine pH 6.0     Leukocyte Esterase, UA Negative     Nitrite, UA Negative     Protein, UR Trace     Glucose, UA Negative     Ketones UA Negative     Urobilinogen, UA 0.2 mg/dL      Bilirubin, UA Small     Blood, UA Moderate     RBC, UA 3 - 5 /hpf       WBC, UA 0 - 5 /hpf      Squamous Epithelial Cells, Urine 0 - 5 /hpf     Narrative:      Replace urinary catheter prior to obtaining the urine culture  if it has been in place for greater than or equal to 14  days:->N/A No Foley  Indications for U/A Reflex to Micro - Reflex to  Culture:->Suprapubic Pain/Tenderness or Dysuria  [place]uled by [id number] at 01/10/2019 23:19 Reason: Patient unavailable    Comprehensive metabolic panel [[id number]]  (Abnormal) Collected: 01/10/19 2318    Specimen: Blood Updated: 01/10/19 2341     Glucose 110 mg/dL      BUN 9.0 mg/dL      Creatinine 0.8 mg/dL      Sodium 621 mEq/L      Potassium 3.6 mEq/L      Chloride 102 mEq/L      CO2 25 mEq/L      Calcium 8.5 mg/dL      Protein, Total 8.1 g/dL      Albumin 4.2 g/dL      AST (SGOT) 15 U/L      ALT 16 U/L      Alkaline Phosphatase 57 U/L      Bilirubin, Total 0.3 mg/dL      Globulin 3.9 g/dL      Albumin/Globulin Ratio 1.1     Anion Gap 12.0    Narrative:      Replace urinary catheter prior to obtaining the urine culture  if it has been in place for greater than or equal to 14  days:->N/A No Foley  Indications for U/A Reflex to Micro - Reflex to  Culture:->Suprapubic Pain/Tenderness or Dysuria    Creatine Kinase (CK) [[id number]] Collected: 01/10/19 2318    Specimen: Blood Updated:  01/10/19 2341     Creatine Kinase (CK) 94 U/L     Narrative:      Replace urinary catheter prior to obtaining the urine culture  if it has been in place for greater than or equal to 14  days:->N/A No Foley  Indications for U/A Reflex to Micro - Reflex to  Culture:->Suprapubic Pain/Tenderness or Dysuria    GFR [[id number]] Collected: 01/10/19 2318     Updated: 01/10/19 2341     EGFR >60.0    Narrative:      Replace urinary catheter prior to obtaining the urine culture  if it has been in place for greater than or equal to 14  days:->N/A No Foley  Indications for U/A Reflex to Micro - Reflex to  Culture:->Suprapubic Pain/Tenderness or Dysuria    CBC and differential  [[id number]]  (Abnormal) Collected: 01/10/19 2318    Specimen: Blood Updated: 01/10/19 2324     WBC 6.93 x10 3/uL      Hgb 11.2 g/dL      Hematocrit 08.6 %      Platelets 282 x10 3/uL      RBC 3.92 x10 6/uL      MCV 90.1 fL      MCH 28.6 pg      MCHC 31.7 g/dL      RDW 14 %      MPV 10.2 fL      Neutrophils 46.4 %      Lymphocytes Automated 44.9 %      Monocytes 7.2 %      Eosinophils Automated 0.7 %      Basophils Automated 0.7 %      Immature Granulocytes 0.1 %      Nucleated RBC 0.0 /100 WBC      Neutrophils Absolute 3.21 x10 3/uL      Lymphocytes Absolute Automated 3.11 x10 3/uL      Monocytes Absolute Automated 0.50 x10 3/uL      Eosinophils Absolute Automated 0.05 x10 3/uL      Basophils Absolute Automated 0.05 x10 3/uL      Immature Granulocytes Absolute 0.01 x10 3/uL      Absolute NRBC 0.00 x10 3/uL     Narrative:      Replace urinary catheter prior to obtaining the urine culture  if it has been in place for greater than or equal to 14  days:->N/A No Foley  Indications for U/A Reflex to Micro - Reflex to  Culture:->Suprapubic Pain/Tenderness or Dysuria            Physical:  Vitals:    01/11/19 0100 01/11/19 0200 01/11/19 0253 01/11/19 0340   BP: (!) 161/92 156/86 145/73 129/82   Pulse:   82 93   Resp:   16 16   Temp:   97.7 F (36.5 C) 97.6 F (36.4 C)   TempSrc:   Temporal Oral   SpO2: 100% 98% 98% 96%   Weight:       Height:         Intake and Output Summary (Last 24 hours) at Date Time    Intake/Output Summary (Last 24 hours) at 01/11/2019 0650  Last data filed at 01/11/2019 0243  Gross per 24 hour   Intake 1000 ml   Output    Net 1000 ml       Physical Exam  Constitutional:       General: She is not in acute distress.     Appearance: She is well-developed. She is not  diaphoretic.   HENT:      Head: Normocephalic and atraumatic.      Nose: Nose normal.   Eyes:      General: No scleral icterus.     Conjunctiva/sclera: Conjunctivae normal.      Pupils: Pupils are equal, round, and reactive to light.    Neck:      Thyroid: No thyromegaly.      Vascular: No JVD.      Trachea: No tracheal deviation.   Cardiovascular:      Rate and Rhythm: Tachycardia and regular rhythm.      Heart sounds: Normal heart sounds. No murmur. No friction rub. No gallop.    Pulmonary:      Effort: Pulmonary effort is normal. No respiratory distress.      Breath sounds: Normal breath sounds. No stridor. No wheezing or rales.   Chest:      Chest wall: No tenderness.   Abdominal:      General: Bowel sounds are normal. There is no distension.      Palpations: Abdomen is soft. There is no mass.      Tenderness: There is no abdominal tenderness. There is no guarding or rebound.   Musculoskeletal:         General: No swelling or tenderness.   Lymphadenopathy:      Cervical: No cervical adenopathy.   Skin:     General: Skin is warm.      Coloration: Skin is not pale.      Findings: No erythema or rash.   Neurological:      General: No focal deficit present.      Mental Status: She is alert and oriented to person, place, and time.      Cranial Nerves: No cranial nerve deficit.      Sensory: No sensory deficit.      Motor: No motoric deficit.   Psychiatric:         Behavior: Behavior normal.         Thought Content: Thought content normal.         Judgment: Judgment normal.       Assessment and plan:  1.  Multiple sclerosis exacerbation: Patient did receive one-time dose of IV Solu-Medrol in the ER, continue supportive care with pain medication as needed, obtain PT/OT evaluation, repeat labs in a.m., consult neurology in a.m.  2.  Asthma: Stable, continue to monitor  2.  Hypothyroid: Continue Synthroid      Janayah Zavada, MD  01/11/2019  6:52 AM

## 2019-01-11 ENCOUNTER — Inpatient Hospital Stay: Payer: 59

## 2019-01-11 DIAGNOSIS — E039 Hypothyroidism, unspecified: Secondary | ICD-10-CM | POA: Diagnosis present

## 2019-01-11 DIAGNOSIS — G35 Multiple sclerosis: Secondary | ICD-10-CM | POA: Diagnosis present

## 2019-01-11 DIAGNOSIS — J45909 Unspecified asthma, uncomplicated: Secondary | ICD-10-CM | POA: Diagnosis present

## 2019-01-11 LAB — CBC AND DIFFERENTIAL
Absolute NRBC: 0 10*3/uL (ref 0.00–0.00)
Basophils Absolute Automated: 0.01 10*3/uL (ref 0.00–0.08)
Basophils Automated: 0.2 %
Eosinophils Absolute Automated: 0 10*3/uL (ref 0.00–0.44)
Eosinophils Automated: 0 %
Hematocrit: 35.1 % (ref 34.7–43.7)
Hgb: 11.1 g/dL — ABNORMAL LOW (ref 11.4–14.8)
Immature Granulocytes Absolute: 0.01 10*3/uL (ref 0.00–0.07)
Immature Granulocytes: 0.2 %
Lymphocytes Absolute Automated: 0.6 10*3/uL (ref 0.42–3.22)
Lymphocytes Automated: 9.6 %
MCH: 28.3 pg (ref 25.1–33.5)
MCHC: 31.6 g/dL (ref 31.5–35.8)
MCV: 89.5 fL (ref 78.0–96.0)
MPV: 10.3 fL (ref 8.9–12.5)
Monocytes Absolute Automated: 0.04 10*3/uL — ABNORMAL LOW (ref 0.21–0.85)
Monocytes: 0.6 %
Neutrophils Absolute: 5.62 10*3/uL (ref 1.10–6.33)
Neutrophils: 89.4 %
Nucleated RBC: 0 /100 WBC (ref 0.0–0.0)
Platelets: 289 10*3/uL (ref 142–346)
RBC: 3.92 10*6/uL (ref 3.90–5.10)
RDW: 14 % (ref 11–15)
WBC: 6.28 10*3/uL (ref 3.10–9.50)

## 2019-01-11 LAB — URINALYSIS REFLEX TO MICROSCOPIC EXAM - REFLEX TO CULTURE
Glucose, UA: NEGATIVE
Ketones UA: NEGATIVE
Leukocyte Esterase, UA: NEGATIVE
Nitrite, UA: NEGATIVE
Specific Gravity UA: 1.025 (ref 1.001–1.035)
Urine pH: 6 (ref 5.0–8.0)
Urobilinogen, UA: 0.2 mg/dL (ref 0.2–2.0)

## 2019-01-11 LAB — COMPREHENSIVE METABOLIC PANEL
ALT: 16 U/L (ref 0–55)
AST (SGOT): 15 U/L (ref 5–34)
Albumin/Globulin Ratio: 1 (ref 0.9–2.2)
Albumin: 3.8 g/dL (ref 3.5–5.0)
Alkaline Phosphatase: 52 U/L (ref 37–106)
Anion Gap: 11 (ref 5.0–15.0)
BUN: 8.8 mg/dL (ref 7.0–19.0)
Bilirubin, Total: 0.3 mg/dL (ref 0.2–1.2)
CO2: 24 mEq/L (ref 22–29)
Calcium: 8.1 mg/dL — ABNORMAL LOW (ref 8.5–10.5)
Chloride: 104 mEq/L (ref 100–111)
Creatinine: 0.7 mg/dL (ref 0.6–1.0)
Globulin: 3.7 g/dL — ABNORMAL HIGH (ref 2.0–3.6)
Glucose: 153 mg/dL — ABNORMAL HIGH (ref 70–100)
Potassium: 4.6 mEq/L (ref 3.5–5.1)
Protein, Total: 7.5 g/dL (ref 6.0–8.3)
Sodium: 139 mEq/L (ref 136–145)

## 2019-01-11 LAB — SEDIMENTATION RATE: Sed Rate: 52 mm/Hr — ABNORMAL HIGH (ref 0–20)

## 2019-01-11 LAB — TSH: TSH: 0.4 u[IU]/mL (ref 0.35–4.94)

## 2019-01-11 LAB — C-REACTIVE PROTEIN: C-Reactive Protein: 0.4 mg/dL (ref 0.0–0.8)

## 2019-01-11 LAB — GFR: EGFR: >60

## 2019-01-11 LAB — MAGNESIUM: Magnesium: 1.9 mg/dL (ref 1.6–2.6)

## 2019-01-11 MED ORDER — HYDROMORPHONE HCL 0.5 MG/0.5 ML IJ SOLN
0.50 mg | INTRAMUSCULAR | Status: DC | PRN
Start: 2019-01-11 — End: 2019-01-13
  Administered 2019-01-13 (×2): 0.5 mg via INTRAVENOUS
  Filled 2019-01-11 (×2): qty 0.5

## 2019-01-11 MED ORDER — GADOBUTROL 1 MMOL/ML IV SOLN
10.00 mL | Freq: Once | INTRAVENOUS | Status: AC | PRN
Start: 2019-01-11 — End: 2019-01-11
  Administered 2019-01-11: 22:00:00 10 mmol via INTRAVENOUS

## 2019-01-11 MED ORDER — MORPHINE SULFATE 2 MG/ML IJ/IV SOLN (WRAP)
2.0000 mg | Status: DC | PRN
Start: 2019-01-11 — End: 2019-01-11
  Administered 2019-01-11: 2 mg via INTRAVENOUS
  Filled 2019-01-11: qty 1

## 2019-01-11 MED ORDER — HYDROCODONE-ACETAMINOPHEN 5-325 MG PO TABS
1.0000 | ORAL_TABLET | Freq: Four times a day (QID) | ORAL | Status: DC | PRN
Start: 2019-01-11 — End: 2019-01-13
  Administered 2019-01-11 – 2019-01-13 (×5): 1 via ORAL
  Filled 2019-01-11 (×5): qty 1

## 2019-01-11 MED ORDER — ZOLPIDEM TARTRATE 5 MG PO TABS
5.0000 mg | ORAL_TABLET | Freq: Every evening | ORAL | Status: DC | PRN
Start: 2019-01-11 — End: 2019-01-13
  Administered 2019-01-11 – 2019-01-12 (×2): 5 mg via ORAL
  Filled 2019-01-11 (×2): qty 1

## 2019-01-11 MED ORDER — SODIUM CHLORIDE 0.9 % IV SOLN
500.00 mg | Freq: Two times a day (BID) | INTRAVENOUS | Status: DC
Start: 2019-01-11 — End: 2019-01-13
  Administered 2019-01-11 – 2019-01-13 (×5): 500 mg via INTRAVENOUS
  Filled 2019-01-11 (×5): qty 4

## 2019-01-11 MED ORDER — CYCLOBENZAPRINE HCL 10 MG PO TABS
10.0000 mg | ORAL_TABLET | Freq: Three times a day (TID) | ORAL | Status: DC | PRN
Start: 2019-01-11 — End: 2019-01-13

## 2019-01-11 MED ORDER — LEVOTHYROXINE SODIUM 50 MCG PO TABS
50.0000 ug | ORAL_TABLET | Freq: Every day | ORAL | Status: DC
Start: 2019-01-11 — End: 2019-01-13
  Administered 2019-01-11 – 2019-01-13 (×3): 50 ug via ORAL
  Filled 2019-01-11 (×3): qty 1

## 2019-01-11 MED ORDER — ACETAMINOPHEN 325 MG PO TABS
650.0000 mg | ORAL_TABLET | ORAL | Status: DC | PRN
Start: 2019-01-10 — End: 2019-01-13
  Administered 2019-01-13: 650 mg via ORAL
  Filled 2019-01-11: qty 2

## 2019-01-11 MED ORDER — ENOXAPARIN SODIUM 40 MG/0.4ML SC SOLN
40.00 mg | SUBCUTANEOUS | Status: DC
Start: 2019-01-11 — End: 2019-01-13
  Administered 2019-01-11 – 2019-01-13 (×3): 40 mg via SUBCUTANEOUS
  Filled 2019-01-11 (×3): qty 0.4

## 2019-01-11 MED ORDER — ONDANSETRON HCL 4 MG/2ML IJ SOLN
4.00 mg | Freq: Four times a day (QID) | INTRAMUSCULAR | Status: DC | PRN
Start: 2019-01-11 — End: 2019-01-13
  Administered 2019-01-11: 23:00:00 4 mg via INTRAVENOUS
  Filled 2019-01-11: qty 2

## 2019-01-11 MED ORDER — LORAZEPAM 2 MG/ML IJ SOLN
1.00 mg | Freq: Once | INTRAMUSCULAR | Status: AC
Start: 2019-01-11 — End: 2019-01-11
  Administered 2019-01-11: 21:00:00 1 mg via INTRAVENOUS
  Filled 2019-01-11: qty 1

## 2019-01-11 MED ORDER — SODIUM CHLORIDE 0.9 % IV SOLN
INTRAVENOUS | Status: DC
Start: 2019-01-11 — End: 2019-01-11

## 2019-01-11 MED ORDER — KETOROLAC TROMETHAMINE 30 MG/ML IJ SOLN
30.00 mg | Freq: Four times a day (QID) | INTRAMUSCULAR | Status: DC | PRN
Start: 2019-01-11 — End: 2019-01-11

## 2019-01-11 NOTE — PT Eval Note (Signed)
[facility]  [address]  [place], [place]. [address]    Department of Rehabilitation  [phone]    Physical Therapy Evaluation    Patient: Judy Freeman    MRN#: [id number]     [id number]/[id number]    Time of treatment: Time Calculation  PT Received On: 01/11/19  Start Time: 1309  Stop Time: 1350  Time Calculation (min): 41 min    PT Visit Number: 1    Consult received for Judy Freeman for PT Evaluation and Treatment.  Patients medical condition is appropriate for Physical therapy intervention at this time.      Assessment:   Judy Freeman is a 43 y.o. female admitted 01/10/2019.  Pt's functional mobility is impacted by:  decreased activity tolerance, decreased balance and gait impairment.  There are a few comorbidities or other factors that affect plan of care and require modification of task including: has stairs to manage, primary caregiver and home alone for a portion of the day.  Standardized tests and exams incorporated into evaluation include AMPAC mobility.  Pt demonstrates a stable clinical presentation. Pt is currently functioning at close to baseline, supervision with all mobility and gait. Would benefit from OP PT to address any residual strength, balance deficits from MS flare-up and resume treatment for her R shoulder (calcific tendinitis). No further inpt skilled PT indicated at this time so will D/C pt from PT.         Complexity Level Hx and Co  morbidites Examination Clinical Decision Making Clinical Presentation   Low no impact 1-2 elements Limited options Stable   Moderate   1-2 factors 3 or more   Several options Evolving, plan may alter   High 3 or more 4 or more Multiple options Unstable, unpredictable       Impairments: Assessment: Appears to be at baseline for mobility;Appears to be at baseline for balance.     Therapy Diagnosis: increased gait dysfunction, decreased endurance/ activity engagement and disequilibrium due to effects of MS flare, but functioning well.      Rehabilitation Potential: Prognosis: Good      Plan:    Treatment/Interventions: No skilled interventions needed at this time PT Frequency: one time visit - therapy discontinued    Risks/Benefits/POC Discussed with Pt/Family: With patient     Progress: Discontinue PT    Goals: N/A         Discharge Recommendations:   Based on today's session patient's discharge recommendation is the following: Discharge Recommendation: Home with outpatient PT(for MS flare & continue with tx for R shldr)   DME Recommended for Discharge: Shower chair        Precautions and Contraindications: none       Medical Diagnosis: Multiple sclerosis exacerbation [G35]  Anemia due to other cause, not classified [D64.89]    History of Present Illness: Judy Freeman is a 43 y.o. female admitted on 01/10/2019 with per H&P 01/10/19, "generalized body pain.  She states she relapsed at the end of September.  She has had generalized weakness and fatigue over her entire arms and legs.  She states the pain has been relentless and severe and burning.  She has right shoulder pain and has been diagnosed with tendinitis therefore has been in physical therapy.  She has had a constant headache with numbness on bilateral fingers and toes.  She feels like she has been having trouble with her speech over the last 1 month--have trouble understanding her sometimes.  She has black  dots and floaters in the vision.  She has had palpitations because she cries due to severe pain--occasionally has emesis after crying severely.  She has the urge to pee but denies any dysuria, frequency, urgency or other.  She states she has been bedbound for the symptoms over the last 3 weeks.  She is able to ambulate but stays in bed most of the day.  She is not taking anything for pain at home.  She stopped taking all her multiple sclerosis medications 5 years ago because "I did not want to feel like a [place] pig" and she has been off medication since.  She last had an MRI with  neurology in January which showed progression of her disease--is in process of switching to a [facility] physician. Denies any chest pain, shortness of breath, nausea, vomiting, diarrhea, urinary complaints, melena, or focal neurological deficits"      Patient Active Problem List   Diagnosis    Multiple sclerosis exacerbation    Asthma    Hypothyroid        Past Medical/Surgical History:  Past Medical History:   Diagnosis Date    Asthma     Breast lump 02/22/2017    painful lump left breast    Bulging lumbar disc     Calcific tendinitis     Disorder of thyroid     MS (multiple sclerosis)     Pneumonia 2011, 2012    Sepsis 2011      Past Surgical History:   Procedure Laterality Date    CESAREAN SECTION      CHOLECYSTECTOMY      DEBRIDEMENT & IRRIGATION, ABSCESS  2015         X-Rays/Tests/Labs:  MRI brain and cervical spine ordered as of 01/11/19    Social History:  Prior Level of Function  Prior level of function: Independent with ADLs, Ambulates independently  Baseline Activity Level: Community ambulation  Driving: independent(only short distances )  Cooking: Yes  Home Living Arrangements  Living Arrangements: Spouse/significant other, Children(lives with wife & 3 children (21, 12yo twins))  Type of Home: Apartment  Home Layout: One level, Performs ADL's on one level, Stairs to enter with rails (add number in comment)(3 flights of stairs to enter )  Bathroom Shower/Tub: Electrical engineer: Hand-held shower  Home Living - Notes / Comments: per pt, she takes care of all home management tasks for the most part unless she can't; pt's wife works outside the home during the day; pt has been going to OP PT for tx of R shldr calcific tendinitis      Subjective:    Patient is agreeable to participation in the therapy session. Nursing clears patient for therapy.   Patient Goal  Patient Goal: to get better and get back to "feeling like myself again"  Pain  Assessment  Pain Assessment: No/denies pain    Objective:   Observation of Patient/Vital Signs:  Patient is in bed with SCD's and peripheral IV access in place.    Inspection/Posture  Inspection/Posture: slow and guarded with ambulation    Cognition/Neuro Status  Arousal/Alertness: Appropriate responses to stimuli  Attention Span: Appears intact  Orientation Level: Oriented X4  Memory: Appears intact  Following Commands: Follows all commands and directions without difficulty  Safety Awareness: independent  Insights: Fully aware of deficits  Problem Solving: Able to problem solve independently  Behavior: attentive;calm;cooperative  Motor Planning: intact  Coordination: intact  Hand Dominance: right handed  Hearing: WNL  Vision: WNL  Sensation: impaired light touch, impaired L side, impaired R side and LE - spotty sensation in B LEs    Gross ROM  Right Upper Extremity ROM: within functional limits(except stiff in R shldr flexion & abduction)  Left Upper Extremity ROM: within functional limits  Right Lower Extremity ROM: within functional limits  Left Lower Extremity ROM: within functional limits  Gross Strength  Right Upper Extremity Strength: within functional limits  Left Upper Extremity Strength: within functional limits  Right Lower Extremity Strength: within functional limits  Left Lower Extremity Strength: within functional limits       Functional Mobility  Supine to Sit: Independent  Sit to Stand: Independent  Stand to Sit: Independent     Locomotion  Ambulation: Supervision  Pattern: decreased cadence;decreased step length  Stair Management: Modified Independent;one rail L;alternating pattern;forward  Number of Stairs: 4  Distance Walked (ft) (Step 6,7): 60 Feet(x 2 trials)  PMP Activity: Step 7 - Walks out of Room     Balance  Balance: needs focused assessment  Standing - Static: Good(without AD)  Standing - Dynamic: Fair(without AD)    Participation and Endurance  Participation Effort: good  Endurance:  Tolerates 30 min exercise with multiple rests         AM-PAC Inpatient Short Forms  Inpatient AM-PAC Performed? (PT): Basic Mobility Inpatient Short Form  AM-PAC "6 Clicks" Basic Mobility Inpatient Short Form  Turning Over in Bed: None  Sitting Down On/Standing From Armchair: None  Lying on Back to Sitting on Side of Bed: None  Assist Moving to/from Bed to Chair: None  Assist to Walk in Hospital Room: None  Assist to Climb 3-5 Steps with Railing: None  PT Basic Mobility Raw Score: 24  CMS 0-100% Score: 0.00%    Treatment Activities: Educated pt on sit to stand exercises in front of a surface for safety (10-15 reps, 3 times per day) to increase strength in B quads & gluts - pt instructed to increase challenge by staying in squat position for a few seconds on her way up and on her way down to increase muscular endurance of quad muscles. Instructed pt to possibly use an office chair in the kitchen to help with meal prep so she can go back & forth from counter & table easily, but had to keep in mind to have chair up against a wall or next to a stable surface before standing up from it or sitting down to it. Demonstrated LE stretches to pt for seated & supine stretches to gluts, LB, TFL/lateral thigh, groin, calves (in standing facing wall). Instructed pt to ambulate as much as she can tolerate to prevent deconditioning but also to listen to her body & take breaks when needed to prevent overfatigue with MS. Also discussed possibly having OP PT recommend appropriate gym equipment for pt to use to help strengthen certain areas & may want to look into aquatic therapy for long-term health benefits for MS. Pt provided with education handouts. Pt verbalized understanding of all of the above.      Educated the patient to role of physical therapy, plan of care, goals of therapy and HEP, safety with mobility and ADLs, energy conservation techniques, discharge instructions, home safety.    Pt left seated in bedside chair with  call bell & bedside table in reach. RN notified of session outcome.       Therapist PPE during session procedural mask, face shield , gloves  and hair  net     Allyne Gee, PT, [place]  [id number]

## 2019-01-11 NOTE — Plan of Care (Signed)
Problem: Safety  Goal: Patient will be free from injury during hospitalization  Outcome: Progressing  Flowsheets (Taken 01/11/2019 [phone])  Patient will be free from injury during hospitalization:   Assess patient's risk for falls and implement fall prevention plan of care per policy   Use appropriate transfer methods   Include patient/ family/ care giver in decisions related to safety   Assess for patients risk for elopement and implement Elopement Risk Plan per policy   Provide alternative method of communication if needed (communication boards, writing)   Ensure appropriate safety devices are available at the bedside   Provide and maintain safe environment  Note: Admitted 43 y/o female pt. A/O x 4. No sob, dizziness or chest pain noted.   Oriented to rm and environment. Explained poc and safety plan. Verbally understanding.   Assessment completed. Call bell w/ in reach. Cont monitoring.   Goal: Patient will be free from infection during hospitalization  Outcome: Progressing     Problem: Pain  Goal: Pain at adequate level as identified by patient  Outcome: Progressing

## 2019-01-11 NOTE — UM Notes (Signed)
42y/o female came into ER on 10/16 and admitted OBS to medical on same day     Pnt w generalized body pain.  She states she relapsed at the end of September.  She has had generalized weakness and fatigue over her entire arms and legs.  She states the pain is been relentless and severe and burning.  She has right pain and has been diagnosed with tendinitis therefore is been in physical therapy.  She has had a constant headache with numbness on bilateral fingers and toes.  She feels like she has been having trouble with her speech over the last 1 month--have trouble understanding her sometimes.  She has black dots and floaters in the vision.  She has had palpitations because she cries due to severe pain--occasionally has emesis after crying severely.  She has the urge to pee but denies any dysuria, frequency, urgency or other.  She states she is been bedbound for the symptoms over the last 3 weeks.  She is able to ambulate but stays in bed most the day.      Past Medical History:   Diagnosis Date    Asthma     Breast lump 02/22/2017    painful lump left breast    Bulging lumbar disc     Calcific tendinitis     Disorder of thyroid     MS (multiple sclerosis)     Pneumonia 2011, 2012    Sepsis 2011     Past Surgical History:   Procedure Laterality Date    CESAREAN SECTION      CHOLECYSTECTOMY      DEBRIDEMENT & IRRIGATION, ABSCESS  2015     VS: 97.4; p96; 100%RA; r16; 169/99; 10/10 pain     ER meds  .9NS bolus   IV toradol   IV fentanyl   IV solumedral   IV zofran     Abn Labs  hgb 11.2   u-prot trace   u-bili small   u-blood mod   Glu 110   glo 3.9     Pnt being admitted OBS to medical for MS Exacerbation  Neuro consult  MRI brain and Cspine  vsq4   PT/OT

## 2019-01-11 NOTE — Progress Notes (Signed)
[facility]  Progress Note  Patient Info:   Date/Time: 01/11/2019 / 2:59 PM   Admit Date:01/10/2019  Patient Name:Judy Freeman   UJW:[id number]   PCP: Hurley Cisco, MD  Attending Physician:Adi Viona Gilmore, MD     Assessment and Plan:    Multiple sclerosis flareup-patient is not on any medications for the past 5 years.  She is currently in process of obtaining a neurologist at [facility]. neurology consultation obtained with Dr. Dorene Grebe, recommends MRI of the brain and cervical spine.  Will start Solu-Medrol 500 mg IV every 12 hours.  Continue supportive care with prn pain meds and muscle relaxant.   Generalized weakness secondary to MS flare-PT OT evaluation   Right shoulder calcific tendinitis-continue as needed analgesia   Chronic low back pain-patient is being followed by a spine surgery as outpatient.  Supportive care.    DVT Prohylaxis: Start Lovenox  Central Line/Foley Catheter/PICC line status: None  Code Status: Full Code  Disposition:Home when stable  Type of Admission:Observation  Expected Date of Discharge: 3 days  Milestones required for discharge: Clinical improvement  Hospital Problems:   Principal Problem:    Multiple sclerosis exacerbation  Active Problems:    Asthma    Hypothyroid    Subjective:   01/11/19   Complains of generalized weakness more so in arms and lower legs  Complains of pain all over  Chief Complaint:  Muscle Weakness and Pain    Review of Systems   Constitutional: Positive for malaise/fatigue. Negative for chills and fever.        Generalized weakness   HENT: Negative for congestion and hearing loss.    Eyes: Negative for blurred vision, double vision and photophobia.        Complains of floaters   Respiratory: Negative for cough, sputum production and wheezing.    Cardiovascular: Negative for chest pain, leg swelling and PND.   Gastrointestinal: Negative for abdominal pain, heartburn, nausea and vomiting.   Genitourinary: Negative for dysuria, flank  pain, frequency and urgency.   Musculoskeletal: Positive for back pain and myalgias. Negative for falls, joint pain and neck pain.   Skin: Negative for itching and rash.   Neurological: Positive for weakness. Negative for dizziness, sensory change, speech change and seizures.   Psychiatric/Behavioral: Negative for depression. The patient is not nervous/anxious.      Objective:     Vitals:    01/11/19 0253 01/11/19 0340 01/11/19 0846 01/11/19 1321   BP: 145/73 129/82 125/83 (!) 151/95   Pulse: 82 93 82 89   Resp: 16 16 16     Temp: 97.7 F (36.5 C) 97.6 F (36.4 C) 97.7 F (36.5 C) 98.2 F (36.8 C)   TempSrc: Temporal Oral Oral Oral   SpO2: 98% 96% 99% 95%   Weight:       Height:         Physical Exam:   Physical Exam   Constitutional: She is oriented to person, place, and time. No distress.   HENT:   Head: Normocephalic and atraumatic.   Eyes: EOM are normal. Right eye exhibits no discharge. Left eye exhibits no discharge.   Neck: Normal range of motion.   Cardiovascular: Normal rate and regular rhythm.   No murmur heard.  Pulmonary/Chest: Effort normal and breath sounds normal. No respiratory distress. She has no wheezes.   Abdominal: Soft. Bowel sounds are normal. She exhibits no distension. There is no abdominal tenderness.   Musculoskeletal: Normal range of motion.  General: No tenderness or edema.   Neurological: She is alert and oriented to person, place, and time. No cranial nerve deficit.   Skin: Skin is warm and dry. She is not diaphoretic. No erythema. No pallor.   Psychiatric: Mood and affect normal.     Results of Labs/imaging   Labs and radiology reports have been reviewed.    Hospitalist   Signed by:   Julien Girt  01/11/2019 2:59 PM    *This note was generated by the Epic EMR system/ Dragon speech recognition and may contain inherent errors or omissions not intended by the user. Grammatical errors, random word insertions, deletions, pronoun errors and incomplete sentences are occasional  consequences of this technology due to software limitations. Not all errors are caught or corrected. If there are questions or concerns about the content of this note or information contained within the body of this dictation they should be addressed directly with the author for clarification

## 2019-01-11 NOTE — ED Notes (Signed)
EMERGENCY CARE [facility]  ED NURSING NOTE FOR THE RECEIVING INPATIENT NURSE   ED NURSE Beverly Sessions [phone]   ED CHARGE RN Nicholos Johns   ADMISSION INFORMATION   Judy Freeman is a 43 y.o. female admitted with an ED diagnosis of:    1. Multiple sclerosis exacerbation    2. Anemia due to other cause, not classified         Isolation: None   Allergies: Penicillins   Holding Orders confirmed? N/A   Belongings Documented? Yes   Home medications sent to pharmacy confirmed? No   NURSING CARE   Patient Comes From:   Mental Status: Home/Family Care  alert and oriented   ADL: Needs assistance with ADLs   Ambulation: mild difficulty   Pertinent Information  and Safety Concerns: N/A     CT / NIH   CT Head ordered on this patient?  No   NIH/Dysphagia assessment done prior to admission? No   VITAL SIGNS (at the time of this note)      Vitals:    01/11/19 0000   BP: 128/65   Pulse: 72   Resp: 14   Temp:    SpO2: 98%

## 2019-01-11 NOTE — Consults (Signed)
History of present illness:   History: 43 year old lady who I saw in 2016 in the Neurology clinic. Pt was non compliant and wanted to take a Holistic approach. She did try Aubagio in the past as per the instructions of Leretha Dykes but that gave her side effects and she discontinued the med  She now comes in with worsening and aches ad pains  MRI Brain and C spine is awaited at this time.  She is on solumedrol for her exacerbation  Discussed different options for her moving forward  She has been advised also to keep her Romeo Apple appointment  Thanks for the consult  Will follow with you    Past Medical History   Past Medical History:   Diagnosis Date    Asthma     Breast lump 02/22/2017    painful lump left breast    Bulging lumbar disc     Calcific tendinitis     Disorder of thyroid     MS (multiple sclerosis)     Pneumonia 2011, 2012    Sepsis 2011          Past Surgical History         Past Surgical History:   Procedure Laterality Date    CESAREAN SECTION      CHOLECYSTECTOMY      DEBRIDEMENT & IRRIGATION, ABSCESS  2015          Family History         Family History   Problem Relation Age of Onset    Cancer Father     Diabetes Maternal Aunt     Cancer Maternal Aunt     Breast cancer Maternal Aunt           Social  Social History           Tobacco Use    Smoking status: Never Smoker    Smokeless tobacco: Never Used   Substance Use Topics    Alcohol use: Yes     Comment: occasionally    Drug use: Not Currently     Types: Marijuana     Comment: for her MS- medical marijuana       .          Allergies   Allergen Reactions    Penicillins Hives             Home Medications           Med List Status: In Progress Set By: Briant Cedar, RN at 01/10/2019 10:39 PM                LEVOTHYROXINE SODIUM PO     Take by mouth                       Flagged for Removal             celecoxib (CELEBREX) 200 MG capsule     Take 200 mg by mouth 2 (two) times  daily     cyclobenzaprine (FLEXERIL) 10 MG tablet     Take 1 tablet (10 mg total) by mouth 3 (three) times daily as needed for Muscle spasms     HYDROcodone-acetaminophen (NORCO) 5-325 MG per tablet     Take 1 tablet by mouth every 6 (six) hours as needed for Pain     naproxen (NAPROSYN) 250 MG tablet     Take 1 tablet (250 mg total) by mouth 2 (two) times daily with  meals.     promethazine (PHENERGAN) 25 MG tablet     Take 1 tablet (25 mg total) by mouth every 8 (eight) hours as needed for Nausea           Review of Systems   Constitutional: Negative for chills and fever.   HENT: Negative for rhinorrhea and sore throat.    Eyes: Negative for photophobia and discharge.   Respiratory: Negative for cough and shortness of breath.    Cardiovascular: Negative for chest pain and palpitations.   Gastrointestinal: Negative for abdominal pain, diarrhea, nausea and vomiting.   Genitourinary: Negative for dysuria, frequency and hematuria.   Musculoskeletal: Positive for back pain and myalgias. Negative for neck pain.   Neurological: Positive for numbness and headaches. Negative for dizziness, syncope, weakness and light-headedness.   Psychiatric/Behavioral: Negative for confusion and suicidal ideas.                         Objective:  Last 24 Hour Vital Signs:  Temp:  [97.4 F (36.3 C)-98.4 F (36.9 C)] 98.4 F (36.9 C)  Heart Rate:  [77-96] 77  Resp Rate:  [16-17] 17  BP: (125-169)/(65-99) 160/94    Physical Exam:      Head atraumatic Normocephalic  Eyes extraocular muscles intact  Cr nerves intact  Motor Good functional strength in all extremities  Reflexes b/l symmetrical  Cerebellar functions ok on FTN testing  Gait not attempted    Scheduled Meds:  Current Facility-Administered Medications   Medication Dose Route Frequency    enoxaparin  40 mg Subcutaneous Q24H SCH    levothyroxine  50 mcg Oral Daily at 0600    LORazepam  1 mg Intravenous Once    methylPREDNISolone  500 mg Intravenous Q12H        Continuous Infusions:      PRN Meds:  acetaminophen, cyclobenzaprine, HYDROcodone-acetaminophen, HYDROmorphone, ondansetron, zolpidem    Last 24 Hour Labs:  Results     Procedure Component Value Units Date/Time    GFR [[id number]] Collected: 01/11/19 0633     Updated: 01/11/19 0806     EGFR >60.0    Comprehensive metabolic panel [[id number]]  (Abnormal) Collected: 01/11/19 7829     Updated: 01/11/19 0806     Glucose 153 mg/dL      BUN 8.8 mg/dL      Creatinine 0.7 mg/dL      Sodium 562 mEq/L      Potassium 4.6 mEq/L      Chloride 104 mEq/L      CO2 24 mEq/L      Calcium 8.1 mg/dL      Protein, Total 7.5 g/dL      Albumin 3.8 g/dL      AST (SGOT) 15 U/L      ALT 16 U/L      Alkaline Phosphatase 52 U/L      Bilirubin, Total 0.3 mg/dL      Globulin 3.7 g/dL      Albumin/Globulin Ratio 1.0     Anion Gap 11.0    C Reactive Protein [[id number]] Collected: 01/11/19 0633     Updated: 01/11/19 0806     C-Reactive Protein 0.4 mg/dL     Magnesium [[id number]] Collected: 01/11/19 0633     Updated: 01/11/19 0803     Magnesium 1.9 mg/dL     TSH [[id number]] Collected: 01/11/19 0633     Updated: 01/11/19 0758     TSH 0.40 uIU/mL  Sedimentation rate (ESR) [[id number]]  (Abnormal) Collected: 01/11/19 0633     Updated: 01/11/19 0738     Sed Rate 52 mm/Hr     CBC and differential [[id number]]  (Abnormal) Collected: 01/11/19 0633     Updated: 01/11/19 0704     WBC 6.28 x10 3/uL      Hgb 11.1 g/dL      Hematocrit 78.2 %      Platelets 289 x10 3/uL      RBC 3.92 x10 6/uL      MCV 89.5 fL      MCH 28.3 pg      MCHC 31.6 g/dL      RDW 14 %      MPV 10.3 fL      Neutrophils 89.4 %      Lymphocytes Automated 9.6 %      Monocytes 0.6 %      Eosinophils Automated 0.0 %      Basophils Automated 0.2 %      Immature Granulocytes 0.2 %      Nucleated RBC 0.0 /100 WBC      Neutrophils Absolute 5.62 x10 3/uL      Lymphocytes Absolute Automated 0.60 x10 3/uL      Monocytes Absolute Automated 0.04 x10 3/uL      Eosinophils Absolute Automated 0.00 x10 3/uL       Basophils Absolute Automated 0.01 x10 3/uL      Immature Granulocytes Absolute 0.01 x10 3/uL      Absolute NRBC 0.00 x10 3/uL     Urinalysis Reflex to Microscopic Exam- Reflex to Culture [[id number]]  (Abnormal) Collected: 01/11/19 0222     Updated: 01/11/19 0236     Urine Type clan catch     Color, UA Yellow     Clarity, UA Clear     Specific Gravity UA 1.025     Urine pH 6.0     Leukocyte Esterase, UA Negative     Nitrite, UA Negative     Protein, UR Trace     Glucose, UA Negative     Ketones UA Negative     Urobilinogen, UA 0.2 mg/dL      Bilirubin, UA Small     Blood, UA Moderate     RBC, UA 3 - 5 /hpf      WBC, UA 0 - 5 /hpf      Squamous Epithelial Cells, Urine 0 - 5 /hpf     Narrative:      Replace urinary catheter prior to obtaining the urine culture  if it has been in place for greater than or equal to 14  days:->N/A No Foley  Indications for U/A Reflex to Micro - Reflex to  Culture:->Suprapubic Pain/Tenderness or Dysuria  Rescheduled by [id number] at 01/10/2019 23:19 Reason: Patient unavailable    Comprehensive metabolic panel [[id number]]  (Abnormal) Collected: 01/10/19 2318    Specimen: Blood Updated: 01/10/19 2341     Glucose 110 mg/dL      BUN 9.0 mg/dL      Creatinine 0.8 mg/dL      Sodium 324 mEq/L      Potassium 3.6 mEq/L      Chloride 102 mEq/L      CO2 25 mEq/L      Calcium 8.5 mg/dL      Protein, Total 8.1 g/dL      Albumin 4.2 g/dL      AST (SGOT) 15 U/L      ALT 16 U/L  Alkaline Phosphatase 57 U/L      Bilirubin, Total 0.3 mg/dL      Globulin 3.9 g/dL      Albumin/Globulin Ratio 1.1     Anion Gap 12.0    Narrative:      Replace urinary catheter prior to obtaining the urine culture  if it has been in place for greater than or equal to 14  days:->N/A No Foley  Indications for U/A Reflex to Micro - Reflex to  Culture:->Suprapubic Pain/Tenderness or Dysuria    Creatine Kinase (CK) [[id number]] Collected: 01/10/19 2318    Specimen: Blood Updated: 01/10/19 2341     Creatine Kinase (CK) 94 U/L      Narrative:      Replace urinary catheter prior to obtaining the urine culture  if it has been in place for greater than or equal to 14  days:->N/A No Foley  Indications for U/A Reflex to Micro - Reflex to  Culture:->Suprapubic Pain/Tenderness or Dysuria    GFR [[id number]] Collected: 01/10/19 2318     Updated: 01/10/19 2341     EGFR >60.0    Narrative:      Replace urinary catheter prior to obtaining the urine culture  if it has been in place for greater than or equal to 14  days:->N/A No Foley  Indications for U/A Reflex to Micro - Reflex to  Culture:->Suprapubic Pain/Tenderness or Dysuria    CBC and differential [[id number]]  (Abnormal) Collected: 01/10/19 2318    Specimen: Blood Updated: 01/10/19 2324     WBC 6.93 x10 3/uL      Hgb 11.2 g/dL      Hematocrit 84.6 %      Platelets 282 x10 3/uL      RBC 3.92 x10 6/uL      MCV 90.1 fL      MCH 28.6 pg      MCHC 31.7 g/dL      RDW 14 %      MPV 10.2 fL      Neutrophils 46.4 %      Lymphocytes Automated 44.9 %      Monocytes 7.2 %      Eosinophils Automated 0.7 %      Basophils Automated 0.7 %      Immature Granulocytes 0.1 %      Nucleated RBC 0.0 /100 WBC      Neutrophils Absolute 3.21 x10 3/uL      Lymphocytes Absolute Automated 3.11 x10 3/uL      Monocytes Absolute Automated 0.50 x10 3/uL      Eosinophils Absolute Automated 0.05 x10 3/uL      Basophils Absolute Automated 0.05 x10 3/uL      Immature Granulocytes Absolute 0.01 x10 3/uL      Absolute NRBC 0.00 x10 3/uL     Narrative:      Replace urinary catheter prior to obtaining the urine culture  if it has been in place for greater than or equal to 14  days:->N/A No Foley  Indications for U/A Reflex to Micro - Reflex to  Culture:->Suprapubic Pain/Tenderness or Dysuria          Last 24 Hour Radiology:  Radiology Results (24 Hour)     ** No results found for the last 24 hours. **          Assessment:    Principal Problem:  Multiple sclerosis exacerbation    Active Problems:   Patient Active Problem List   Diagnosis     Multiple  sclerosis exacerbation    Asthma    Hypothyroid       Plan:    43 year old lady who I saw in 2016 in the Neurology clinic. Pt was non compliant and wanted to take a Holistic approach. She did try Aubagio in the past as per the instructions of Leretha Dykes but that gave her side effects and she discontinued the med  She now comes in with worsening and aches ad pains  MRI Brain and C spine is awaited at this time.  She is on solumedrol for her exacerbation  Discussed different options for her moving forward  She has been advised also to keep her [facility] Apple appointment  Thanks for the consult  Will follow with you    [place]an[place] Whitefield Maye Hides, MD  Neurology

## 2019-01-12 LAB — BASIC METABOLIC PANEL
Anion Gap: 12 (ref 5.0–15.0)
BUN: 10.9 mg/dL (ref 7.0–19.0)
CO2: 25 mEq/L (ref 22–29)
Calcium: 8.7 mg/dL (ref 8.5–10.5)
Chloride: 102 mEq/L (ref 100–111)
Creatinine: 0.7 mg/dL (ref 0.6–1.0)
Glucose: 129 mg/dL — ABNORMAL HIGH (ref 70–100)
Potassium: 4.1 mEq/L (ref 3.5–5.1)
Sodium: 139 mEq/L (ref 136–145)

## 2019-01-12 LAB — CBC
Absolute NRBC: 0 10*3/uL (ref 0.00–0.00)
Hematocrit: 35.5 % (ref 34.7–43.7)
Hgb: 11.3 g/dL — ABNORMAL LOW (ref 11.4–14.8)
MCH: 28.5 pg (ref 25.1–33.5)
MCHC: 31.8 g/dL (ref 31.5–35.8)
MCV: 89.6 fL (ref 78.0–96.0)
MPV: 10.4 fL (ref 8.9–12.5)
Nucleated RBC: 0 /100 WBC (ref 0.0–0.0)
Platelets: 322 10*3/uL (ref 142–346)
RBC: 3.96 10*6/uL (ref 3.90–5.10)
RDW: 14 % (ref 11–15)
WBC: 13.88 10*3/uL — ABNORMAL HIGH (ref 3.10–9.50)

## 2019-01-12 LAB — GFR: EGFR: >60

## 2019-01-12 MED ORDER — POLYETHYLENE GLYCOL 3350 17 G PO PACK
17.00 g | PACK | Freq: Every day | ORAL | Status: DC
Start: 2019-01-13 — End: 2019-01-13
  Administered 2019-01-13: 09:00:00 17 g via ORAL
  Filled 2019-01-12: qty 1

## 2019-01-12 MED ORDER — BUTALBITAL-APAP-CAFFEINE 50-325-40 MG PO TABS
1.0000 | ORAL_TABLET | Freq: Four times a day (QID) | ORAL | Status: DC | PRN
Start: 2019-01-12 — End: 2019-01-13
  Administered 2019-01-12 (×2): 1 via ORAL
  Filled 2019-01-12 (×2): qty 1

## 2019-01-12 NOTE — Progress Notes (Signed)
[facility]  Progress Note  Patient Info:   Date/Time: 01/12/2019 / 12:45 PM   Admit Date:01/10/2019  Patient Name:Judy Freeman   ZOX:[id number]   PCP: Hurley Cisco, MD  Attending Physician:Adi Viona Gilmore, MD     Assessment and Plan:    Multiple sclerosis flare up-started on high-dose Solu-Medrol 500 mg IV every 12 hours for 3 days per neurology recommendations.  MRI of the brain showed new T2 hyperintense focus without associated hand enhancement in the left frontal white matter.  MRI of the C-spine shows no significant change in the chronic demyelinating spinal cord lesions.   Discussed with neurology Dr. Dorene Grebe.  Plan to continue IV steroids for 3 days.  Continue supportive care with prn pain meds and muscle relaxant.   Generalized weakness secondary to MS flare-seen by PT OT, recommends home with outpatient PT upon discharge   Right shoulder calcific tendinitis-continue supportive care   Chronic low back pain-patient is being followed by a spine surgery as outpatient.     DVT Prohylaxis: Start Lovenox  Central Line/Foley Catheter/PICC line status: None  Code Status: Full Code  Disposition:Home when stable  Type of Admission:Inpatient  Expected Date of Discharge: 1 day  Milestones required for discharge: Clinical improvement  Hospital Problems:   Principal Problem:    Multiple sclerosis exacerbation  Active Problems:    Asthma    Hypothyroid    Subjective:   01/12/19   Complains of headache and generalized weakness    Chief Complaint:  Muscle Weakness and Pain    Review of Systems   Constitutional: Positive for malaise/fatigue. Negative for chills and fever.        Generalized weakness   HENT: Negative for congestion and hearing loss.    Eyes: Negative for blurred vision, double vision and photophobia.        + floaters   Respiratory: Negative for cough, sputum production and wheezing.    Cardiovascular: Negative for chest pain, leg swelling and PND.   Gastrointestinal: Negative for  abdominal pain, heartburn, nausea and vomiting.   Genitourinary: Negative for dysuria, flank pain, frequency and urgency.   Musculoskeletal: Positive for back pain and myalgias. Negative for falls, joint pain and neck pain.   Skin: Negative for itching and rash.   Neurological: Positive for weakness. Negative for dizziness, sensory change, speech change and seizures.   Psychiatric/Behavioral: Negative for depression. The patient is not nervous/anxious.      Objective:     Vitals:    01/11/19 1952 01/11/19 2304 01/12/19 0422 01/12/19 0803   BP: (!) 160/94 128/88 116/77 135/88   Pulse: 77 77 79 87   Resp: 17 16 16 16    Temp:  97.6 F (36.4 C) 98.1 F (36.7 C) 97.9 F (36.6 C)   TempSrc:  Oral Temporal Oral   SpO2: 100% 98% 98% 100%   Weight:       Height:         Physical Exam:   Physical Exam   Constitutional: She is oriented to person, place, and time. No distress.   HENT:   Head: Normocephalic and atraumatic.   Eyes: EOM are normal. Right eye exhibits no discharge. Left eye exhibits no discharge.   Neck: Normal range of motion.   Cardiovascular: Normal rate and regular rhythm.   No murmur heard.  Pulmonary/Chest: Effort normal and breath sounds normal. No respiratory distress. She has no wheezes.   Abdominal: Soft. Bowel sounds are normal. She exhibits no distension. There  is no abdominal tenderness.   Musculoskeletal: Normal range of motion.         General: No tenderness or edema.   Neurological: She is alert and oriented to person, place, and time. No cranial nerve deficit.   Skin: Skin is warm and dry. She is not diaphoretic. No erythema. No pallor.   Psychiatric: Mood and affect normal.     Results of Labs/imaging   Labs and radiology reports have been reviewed.    Hospitalist   Signed by:   Julien Girt  01/12/2019 12:45 PM    *This note was generated by the Epic EMR system/ Dragon speech recognition and may contain inherent errors or omissions not intended by the user. Grammatical errors, random word  insertions, deletions, pronoun errors and incomplete sentences are occasional consequences of this technology due to software limitations. Not all errors are caught or corrected. If there are questions or concerns about the content of this note or information contained within the body of this dictation they should be addressed directly with the author for clarification

## 2019-01-12 NOTE — Progress Notes (Signed)
Subjective:  Interval History: Discussed MRI results with patient in detail  Will follow her up as an outpt  She will also see experts at [facility]  Thanks    Objective:  Last 24 Hour Vital Signs:  Temp:  [97.6 F (36.4 C)-98.6 F (37 C)] 98.2 F (36.8 C)  Heart Rate:  [70-87] 86  Resp Rate:  [16-17] 16  BP: (116-160)/(77-94) 120/78    Physical Exam:      Head atraumatic Normocephalic  Eyes extraocular muscles intact  Motor Good functional strength in all extremities    Scheduled Meds:  Current Facility-Administered Medications   Medication Dose Route Frequency    enoxaparin  40 mg Subcutaneous Q24H SCH    levothyroxine  50 mcg Oral Daily at 0600    methylPREDNISolone  500 mg Intravenous Q12H       Continuous Infusions:      PRN Meds:  acetaminophen, butalbital-acetaminophen-caffeine, cyclobenzaprine, HYDROcodone-acetaminophen, HYDROmorphone, ondansetron, zolpidem    Last 24 Hour Labs:  Results     Procedure Component Value Units Date/Time    Basic Metabolic Panel [[id number]]  (Abnormal) Collected: 01/12/19 0741    Specimen: Blood Updated: 01/12/19 0900     Glucose 129 mg/dL      BUN 56.2 mg/dL      Creatinine 0.7 mg/dL      Calcium 8.7 mg/dL      Sodium 130 mEq/L      Potassium 4.1 mEq/L      Chloride 102 mEq/L      CO2 25 mEq/L      Anion Gap 12.0    GFR [[id number]] Collected: 01/12/19 0741     Updated: 01/12/19 0900     EGFR >60.0    CBC without differential [[id number]]  (Abnormal) Collected: 01/12/19 0741    Specimen: Blood Updated: 01/12/19 0833     WBC 13.88 x10 3/uL      Hgb 11.3 g/dL      Hematocrit 44.0 %      Platelets 322 x10 3/uL      RBC 3.96 x10 6/uL      MCV 89.6 fL      MCH 28.5 pg      MCHC 31.8 g/dL      RDW 14 %      MPV 10.4 fL      Nucleated RBC 0.0 /100 WBC      Absolute NRBC 0.00 x10 3/uL           Last 24 Hour Radiology:  Radiology Results (24 Hour)     Procedure Component Value Units Date/Time    MRI Brain W WO Contrast [[id number]] Collected: 01/12/19 0851    Order Status:  Completed Updated: 01/12/19 0914    Narrative:      HISTORY: Multiple sclerosis, monitor.    COMPARISON: 04/17/2018.    TECHNIQUE: MR images of the brain without and with contrast were  obtained. 10 mL of Gadavist was administered intravenously. The study  was performed as a dedicated demyelinating disease evaluation on 3T  system.    FINDINGS:  There is a new T2 hyperintense focus within the high left frontal white  matter measuring up to 7 mm (image 30, series 5). No associated  enhancement is identified.  Otherwise, no significant interval change in extent of moderate volume,  multifocal T2 hyperintense lesions involving primarily the  supratentorial white matter, in a pattern suggestive of chronic  demyelinating process. Stable subcortical lesion involving the lateral  right parietotemporal region. Stable  small posterior fossa foci.  There is no evidence of acute infarction. No new enhancing intracranial  foci.  There is no mass effect. There is no intracranial hemorrhage or abnormal  extra-axial collection. There is no hydrocephalus.  There is no destructive osseous lesion. There is mild paranasal sinus  mucosal thickening. The mastoid cells are clear.      Impression:         There is a new T2 hyperintense focus without associated enhancement in  the high left frontal white matter. This is likely nonacute given lack  of enhancement.  Otherwise stable moderate volume chronic demyelinating disease.    Susy Frizzle, MD   01/12/2019 9:12 AM    MRI Cervical Spine W WO Contrast [[id number]] Collected: 01/12/19 0900    Order Status: Completed Updated: 01/12/19 0914    Narrative:      HISTORY: Multiple sclerosis.    COMPARISON: 04/17/2018.    TECHNIQUE: MR imaging of the cervical spine without and with contrast.  10 mL of Gadavist was administered intravenously.  The study was performed as a dedicated demyelinating disease evaluation  on 3T system.    FINDINGS:  There has been no significant interval change in extent  of moderate  volume, multifocal T2 hyperintense lesions within the cervical spinal  cord. Specifically, stable more dominant lesions involving the left  dorsal/lateral cord at C1 or C2, and the mid/ventral cord at C3, as well  as smaller foci for example within the right cord at C4-C5, and the left  lateral cord at C5. No discrete new cervical spinal cord foci are  identified.  There is no abnormal intramedullary enhancement.  There is reversal of the normal cervical lordosis. The vertebral body  alignment is within normal limits.  The vertebral body heights are maintained. There is no destructive  osseous lesion.  There is moderate degenerative spondylosis, not significantly changed in  extent. Again there is degenerative disc disease primarily involving  C4-C5, C5-C6, C6-C7. No significant change in moderate to severe left  neural foraminal narrowing at C6-C7, moderate left neural foraminal  narrowing at C5-C6, and mild to moderate multilevel spinal canal  narrowing.  The paravertebral soft tissues show no acute abnormality.      Impression:        1. No significant interval change in extent of chronic demyelinating  spinal cord lesions. No discrete new lesion or enhancing foci.  2. Stable degenerative changes.    Susy Frizzle, MD   01/12/2019 9:12 AM          Assessment:    Principal Problem:  Multiple sclerosis exacerbation    Active Problems:   Patient Active Problem List   Diagnosis    Multiple sclerosis exacerbation    Asthma    Hypothyroid       Plan:  Discussed MRI results with patient in detail  Will follow her up as an outpt  She will also see experts at [facility]  Thanks        Latesa Fratto Maye Hides, MD  Neurology

## 2019-01-12 NOTE — Plan of Care (Signed)
Pt A/Ox4, VSS, tolerating PO intake well but states she has poor appetite  Ambulates OOB to bathroom w/ standby assist, voids without difficulty  Complains of headache/migraine, unrelieved by prn meds at this time, MD aware - awaiting orders  No other distress noted at this time  LOW fall risk      Vitals:    01/11/19 2304 01/12/19 0422 01/12/19 0803 01/12/19 1307   BP: 128/88 116/77 135/88 144/87   Pulse: 77 79 87 70   Resp: 16 16 16 16    Temp: 97.6 F (36.4 C) 98.1 F (36.7 C) 97.9 F (36.6 C) 98.6 F (37 C)   TempSrc: Oral Temporal Oral Oral   SpO2: 98% 98% 100% 99%   Weight:       Height:           Patient Lines/Drains/Airways Status      Active Lines, Drains and Airways       Name:   Placement date:   Placement time:   Site:   Days:    Peripheral IV 01/10/19 20 G Right Antecubital   01/10/19    2316    Antecubital   1                      Problem: Safety  Goal: Patient will be free from injury during hospitalization  Outcome: Progressing  Flowsheets (Taken 01/11/2019 0646 by Vladimir Faster, RN)  Patient will be free from injury during hospitalization:   Assess patient's risk for falls and implement fall prevention plan of care per policy   Use appropriate transfer methods   Include patient/ family/ care giver in decisions related to safety   Assess for patients risk for elopement and implement Elopement Risk Plan per policy   Provide alternative method of communication if needed (communication boards, writing)   Ensure appropriate safety devices are available at the bedside   Provide and maintain safe environment  Goal: Patient will be free from infection during hospitalization  Outcome: Progressing  Flowsheets (Taken 01/12/2019 1342)  Free from Infection during hospitalization:   Assess and monitor for signs and symptoms of infection   Monitor lab/diagnostic results     Problem: Pain  Goal: Pain at adequate level as identified by patient  Outcome: Progressing  Flowsheets (Taken 01/12/2019 1342)  Pain  at adequate level as identified by patient:   Identify patient comfort function goal   Assess for risk of opioid induced respiratory depression, including snoring/sleep apnea. Alert healthcare team of risk factors identified.   Assess pain on admission, during daily assessment and/or before any "as needed" intervention(s)   Reassess pain within 30-60 minutes of any procedure/intervention, per Pain Assessment, Intervention, Reassessment (AIR) Cycle     Problem: Side Effects from Pain Analgesia  Goal: Patient will experience minimal side effects of analgesic therapy  Outcome: Progressing     Problem: Discharge Barriers  Goal: Patient will be discharged home or other facility with appropriate resources  Outcome: Progressing     Problem: Psychosocial and Spiritual Needs  Goal: Demonstrates ability to cope with hospitalization/illness  Outcome: Progressing  Flowsheets (Taken 01/12/2019 1342)  Demonstrates ability to cope with hospitalizations/illness:   Encourage verbalization of feelings/concerns/expectations   Provide quiet environment   Assist patient to identify own strengths and abilities   Encourage patient to set small goals for self

## 2019-01-12 NOTE — UM Notes (Signed)
42y/o female came into ER on 10/16 and admitted Inpatient to medical on same day.    Pnt w generalized body pain. She states she relapsed at the end of September. She has had generalized weakness and fatigue over her entire arms and legs. She states the pain is been relentless and severe and burning. She has right pain and has been diagnosed with tendinitis therefore is been in physical therapy. She has had a constant headache with numbness on bilateral fingers and toes. She feels like she has been having trouble with her speech over the last 1 month--have trouble understanding her sometimes. She has black dots and floaters in the vision. She has had palpitations because she cries due to severe pain--occasionally has emesis after crying severely. She has the urge to pee but denies any dysuria, frequency, urgency or other. She states she is been bedbound for the symptoms over the last 3 weeks. She is able to ambulate but stays in bed most the day.       Past Medical History  Past Medical History:  Diagnosis Date   Asthma    Breast lump 02/22/2017   painful lump left breast   Bulging lumbar disc    Calcific tendinitis    Disorder of thyroid    MS (multiple sclerosis)    Pneumonia 2011, 2012   Sepsis 2011        Past Surgical History  Past Surgical History:  Procedure Laterality Date   CESAREAN SECTION     CHOLECYSTECTOMY     DEBRIDEMENT & IRRIGATION, ABSCESS  2015      VS: 97.4; p96; 100%RA; r16; 169/99; 10/10 pain     ER meds  .9NS bolus   IV toradol   IV fentanyl   IV solumedral   IV zofran     Abn Labs  hgb 11.2   u-prot trace   u-bili small   u-blood mod   Glu 110   glo 3.9     Pnt being admitted Inpatient to medical for MS Exacerbation    Per medical:  1. Multiple sclerosis exacerbation  She was given Solu-Medrol x1 in the ER.  Will defer on further steroids to neurology.  --Noncompliant with her multiple sclerosis medications x5 years.  --Flareup was 4 years ago  reportedly.  --The brain is showing Last MRI brain was 1/20:" Findings consistent with the given diagnosis of demyelinating disease. There has been progression comparing to the prior study from 2016. No enhancing lesions are seen"  --Consult neurology in the morning.  She states she is in between neurologists and wants to follow-up at [facility].  --MS in [facility]   --Neurology [place]  --CRP, CBC, CMP, MG, ESR. TSH    2.History of --MS in [facility]   --Neurology [place]: Stable and chronic.    3.Right shoulder tendinitis: She is in physical therapy for this.    ------------------On 01/11/19---------  Per Medical:   Multiple sclerosis flareup-patient is not on any medications for the past 5 years.  She is currently in process of obtaining a neurologist at [facility]. neurology consultation obtained with Dr. Dorene Grebe, recommends MRI of the brain and cervical spine.  Will start Solu-Medrol 500 mg IV every 12 hours.  Continue supportive care with prn pain meds and muscle relaxant.   Generalized weakness secondary to MS flare-PT OT evaluation   Right shoulder calcific tendinitis-continue as needed analgesia   Chronic low back pain-patient is being followed by a spine surgery as  outpatient.  Supportive care.    Per neuro:   Aubagio in the past as per the instructions of Leretha Dykes but that gave her side effects and she discontinued the med  She now comes in with worsening and aches ad pains  MRI Brain and C spine is awaited at this time.  She is on solumedrol for her exacerbation  Discussed different options for her moving forward  She has been advised also to keep her [facility] appointment    Orders: lovenox sc, solu medrol iv 100/hr, synthroid po, prn ambien po, dilaudid iv, prn zofran iv, flexeril po prn, norco po prn, reg diet, O2 5 lpm or less, p ox routine, vs q 4 hrs, OT/PT

## 2019-01-13 DIAGNOSIS — M545 Low back pain: Secondary | ICD-10-CM

## 2019-01-13 DIAGNOSIS — R531 Weakness: Secondary | ICD-10-CM

## 2019-01-13 DIAGNOSIS — M7531 Calcific tendinitis of right shoulder: Secondary | ICD-10-CM

## 2019-01-13 DIAGNOSIS — G8929 Other chronic pain: Secondary | ICD-10-CM

## 2019-01-13 DIAGNOSIS — G43909 Migraine, unspecified, not intractable, without status migrainosus: Secondary | ICD-10-CM

## 2019-01-13 LAB — CBC
Absolute NRBC: 0 10*3/uL (ref 0.00–0.00)
Hematocrit: 34.1 % — ABNORMAL LOW (ref 34.7–43.7)
Hgb: 11 g/dL — ABNORMAL LOW (ref 11.4–14.8)
MCH: 28.5 pg (ref 25.1–33.5)
MCHC: 32.3 g/dL (ref 31.5–35.8)
MCV: 88.3 fL (ref 78.0–96.0)
MPV: 10.3 fL (ref 8.9–12.5)
Nucleated RBC: 0 /100 WBC (ref 0.0–0.0)
Platelets: 323 10*3/uL (ref 142–346)
RBC: 3.86 10*6/uL — ABNORMAL LOW (ref 3.90–5.10)
RDW: 14 % (ref 11–15)
WBC: 17.62 10*3/uL — ABNORMAL HIGH (ref 3.10–9.50)

## 2019-01-13 LAB — BASIC METABOLIC PANEL
Anion Gap: 10 (ref 5.0–15.0)
BUN: 14.1 mg/dL (ref 7.0–19.0)
CO2: 28 mEq/L (ref 22–29)
Calcium: 8.4 mg/dL — ABNORMAL LOW (ref 8.5–10.5)
Chloride: 101 mEq/L (ref 100–111)
Creatinine: 0.7 mg/dL (ref 0.6–1.0)
Glucose: 144 mg/dL — ABNORMAL HIGH (ref 70–100)
Potassium: 3.8 mEq/L (ref 3.5–5.1)
Sodium: 139 mEq/L (ref 136–145)

## 2019-01-13 LAB — GFR: EGFR: >60

## 2019-01-13 MED ORDER — BUTALBITAL-APAP-CAFFEINE 50-325-40 MG PO TABS
1.0000 | ORAL_TABLET | Freq: Four times a day (QID) | ORAL | 0 refills | Status: DC | PRN
Start: 2019-01-13 — End: 2020-06-29

## 2019-01-13 MED ORDER — HYDROCODONE-ACETAMINOPHEN 5-325 MG PO TABS
1.00 | ORAL_TABLET | Freq: Four times a day (QID) | ORAL | 0 refills | Status: AC | PRN
Start: 2019-01-13 — End: 2019-01-20

## 2019-01-13 NOTE — Progress Notes (Signed)
Ongoing headaches. Pt reports minimal relief with Fioricet. Fioricet, noroco, and dilaudid given overnoc for prn pain relief as ordered. Pt reported the most pain relief w/ dilaudid however pain relief was not sustained. See MAR & pain levels in flowsheets.

## 2019-01-13 NOTE — Progress Notes (Signed)
[place] pt home, IV DCd, tolerated  Instructions given, scripts sent to pt's preferred pharmacy  Pt aware and agreeable to [place] plan  No distress noted at time of [place]

## 2019-01-13 NOTE — Progress Notes (Signed)
Discharge home w/ OP f/u.  Partner to provide transport.       01/13/19 1442   Discharge Disposition   Patient preference/choice provided? Yes   Physical Discharge Disposition Home   Mode of Transportation Car   Patient/Family/POA notified of transfer plan Yes   Patient agreeable to discharge plan/expected d/c date? Yes   Family/POA agreeable to discharge plan/expected d/c date? Yes   Bedside nurse notified of transport plan? Yes   CM Interventions   Follow up appointment scheduled? No   Reason no follow up scheduled? Family to schedule   Referral made for home health RN visit? Does not meet home bound criteria   Multidisciplinary rounds/family meeting before d/c? Yes   Medicare Checklist   Is this a Medicare patient? No

## 2019-01-13 NOTE — Progress Notes (Signed)
Situation 43 y/o F admit 10/16 w/ MS flare.  Pt admits has not taken MS meds for last 5 years.     Background Pmh asthma, Hypothyroidism.  Pt A&Ox4, requires some assistance currently w/ dressing/bathing; normally independent.  Lives w/ domestic partner and 3 children, ages 25 and 27 y/o twins.     Assessment IV Solumedrol, Neuro consult Dr Dorene Grebe, PT/OT eval, Labs.     Recommendation Dispo:  home         01/13/19 1054   Patient Type   Within 30 Days of Previous Admission? No   Healthcare Decisions   Interviewed: Patient   English as a second language teacher Information: SAA   Orientation/Decision Making Abilities of Patient Alert and Oriented x3, able to make decisions   Additional Emergency Contacts? Domestic partner  [employer]   Prior to admission   Have running water, electricity, heat, etc? Yes   How do you get to your MD appointments? Self and /or partner for ALL   Adult Protective Services (APS) involved? No   Discharge Planning   Anticipated [place] plan discussed with: Same as interviewed   [place] discussion contact information: SAA   Mode of transportation: Private car (family member)   Does the patient have perscription coverage? Yes   Consults/Providers   Outcome Palliative Care Screen Screened but did not meet criteria for intervention   Correct PCP listed in Epic? Yes   Important Message from [employer] Notice   Patient received 1st IMM Letter? n/a

## 2019-01-13 NOTE — Discharge Summary (Signed)
[facility] HOSPITALIST   [place] Summary   Patient Info:   Date/Time: 01/13/2019 / 4:49 PM   Admit Date:01/10/2019  Patient Name:Judy Freeman   ZOX:[id number]   PCP: Hurley Cisco, MD  Attending Physician:Adi Viona Gilmore, MD     Hospital Course:   Please see H&P for complete details of HPI and ROS. The patient was admitted to [facility] and has been taken care as mentioned below.   Multiple sclerosis Exacerbatiom-patient presented with generalized body aches and weakness.  Neurology consultation obtained with Dr. Caleen Essex, recommended to obtain MRI of the brain and cervical spine with without contrast.  Started on high-dose Solu-Medrol 500 mg IV every 12 hours for 3 days per neurology recommendations.  MRI of the brain showed new T2 hyperintense focus without associated hand enhancement in the left frontal white matter.  MRI of the C-spine shows no significant change in the chronic demyelinating spinal cord lesions.     Patient received IV steroids for 3 days.  Commended to follow-up with neurology Dr. Dorene Grebe as outpatient.  Patient is noncompliant with medications, counseled the risks.   Generalized weakness secondary to MS flare-seen by PT OT, recommends home with outpatient PT upon discharge   Migraine headache will discharge on as needed Norco and Fioricet   Right shoulder calcific tendinitis-continue supportive care outpatient PT   Chronic low back pain-patient is being followed by a spine surgery as outpatient.       Disposition-Home  Condition at Discharge and Prognosis: Stable  Admission Date:01/10/2019  Discharge Date: 01/13/19  Type of Admission:Inpatient   Code Status: Full Code  Subjective at the time of discharge:   Feels better.  Generalized weakness improving.  Chief Complaint:  Muscle Weakness and Pain    Objective:     Vitals:    01/12/19 2323 01/13/19 0507 01/13/19 0855 01/13/19 1327   BP: 125/86 (!) 154/99 139/89 138/86   Pulse: 85 77 73 75   Resp: 18 17 16 16     Temp: 98.2 F (36.8 C) 98.1 F (36.7 C) 98.1 F (36.7 C) 98.1 F (36.7 C)   TempSrc: Oral Oral Oral Oral   SpO2: 98% 98% 98% 100%   Weight:       Height:           Clinical Presentation:   History of Presenting Illness: Please refer to HPI in the Detailed H&P       Discharge Medication List      Taking    butalbital-acetaminophen-caffeine 50-325-40 MG per tablet  Dose: 1 tablet  Commonly known as: FIORICET  Take 1 tablet by mouth every 6 (six) hours as needed for Headaches     HYDROcodone-acetaminophen 5-325 MG per tablet  Dose: 1 tablet  Commonly known as: NORCO  Take 1 tablet by mouth every 6 (six) hours as needed for Pain     ibuprofen 800 MG tablet  Dose: 800 mg  Commonly known as: ADVIL  Take 800 mg by mouth every 6 (six) hours as needed for Pain     LEVOTHYROXINE SODIUM PO  Take by mouth     multivitamin capsule  Dose: 1 capsule  Take 1 capsule by mouth daily     Vitamin D3 50 MCG (2000 UT) Tabs  Take by mouth          Follow up recommendations:   Follow up:   Follow-up Information     Hurley Cisco, MD. Schedule an appointment as soon as possible for  a visit in 3 day(s).    Specialty: Internal Medicine  Contact information:  [address]  [place] [address]  [phone]             Gwendalyn Ege, MD. Schedule an appointment as soon as possible for a visit in 2 day(s).    Specialty: Neurology  Contact information:  [address]  [place] [address]  [phone]                  Results of Labs/imaging:   Labs have been reviewed:   Coagulation Profile:       CBC review:   Recent Labs   Lab 01/13/19  0558 01/12/19  0741 01/11/19  0633 01/10/19  2318   WBC 17.62* 13.88* 6.28 6.93   Hgb 11.0* 11.3* 11.1* 11.2*   Hematocrit 34.1* 35.5 35.1 35.3   Platelets 323 322 289 282   MCV 88.3 89.6 89.5 90.1   RDW 14 14 14 14    Neutrophils  --   --  89.4 46.4   Lymphocytes Automated  --   --  9.6 44.9   Eosinophils Automated  --   --  0.0 0.7   Immature Granulocytes  --   --  0.2 0.1    Neutrophils Absolute  --   --  5.62 3.21   Immature Granulocytes Absolute  --   --  0.01 0.01     Chem Review:  Recent Labs   Lab 01/13/19  0558 01/12/19  0741 01/11/19  0633 01/10/19  2318   Sodium 139 139 139 139   Potassium 3.8 4.1 4.6 3.6   Chloride 101 102 104 102   CO2 28 25 24 25    BUN 14.1 10.9 8.8 9.0   Creatinine 0.7 0.7 0.7 0.8   Glucose 144* 129* 153* 110*   Calcium 8.4* 8.7 8.1* 8.5   Magnesium  --   --  1.9  --    Bilirubin, Total  --   --  0.3 0.3   AST (SGOT)  --   --  15 15   ALT  --   --  16 16   Alkaline Phosphatase  --   --  52 57     Results     Procedure Component Value Units Date/Time    GFR [[id number]] Collected: 01/13/19 0558     Updated: 01/13/19 0657     EGFR >60.0    Basic Metabolic Panel [[id number]]  (Abnormal) Collected: 01/13/19 0558    Specimen: Blood Updated: 01/13/19 0657     Glucose 144 mg/dL      BUN 01.0 mg/dL      Creatinine 0.7 mg/dL      Calcium 8.4 mg/dL      Sodium 272 mEq/L      Potassium 3.8 mEq/L      Chloride 101 mEq/L      CO2 28 mEq/L      Anion Gap 10.0    CBC without differential [[id number]]  (Abnormal) Collected: 01/13/19 0558    Specimen: Blood Updated: 01/13/19 0640     WBC 17.62 x10 3/uL      Hgb 11.0 g/dL      Hematocrit 74.2 %      Platelets 323 x10 3/uL      RBC 3.86 x10 6/uL      MCV 88.3 fL      MCH 28.5 pg      MCHC 32.3 g/dL  RDW 14 %      MPV 10.3 fL      Nucleated RBC 0.0 /100 WBC      Absolute NRBC 0.00 x10 3/uL         Radiology reports have been reviewed:  Radiology Results (24 Hour)     ** No results found for the last 24 hours. **        Mri Brain W Wo Contrast    Result Date: 01/12/2019  HISTORY: Multiple sclerosis, monitor. COMPARISON: 04/17/2018. TECHNIQUE: MR images of the brain without and with contrast were obtained. 10 mL of Gadavist was administered intravenously. The study was performed as a dedicated demyelinating disease evaluation on 3T system. FINDINGS: There is a new T2 hyperintense focus within the high left frontal white matter  measuring up to 7 mm (image 30, series 5). No associated enhancement is identified. Otherwise, no significant interval change in extent of moderate volume, multifocal T2 hyperintense lesions involving primarily the supratentorial white matter, in a pattern suggestive of chronic demyelinating process. Stable subcortical lesion involving the lateral right parietotemporal region. Stable small posterior fossa foci. There is no evidence of acute infarction. No new enhancing intracranial foci. There is no mass effect. There is no intracranial hemorrhage or abnormal extra-axial collection. There is no hydrocephalus. There is no destructive osseous lesion. There is mild paranasal sinus mucosal thickening. The mastoid cells are clear.      There is a new T2 hyperintense focus without associated enhancement in the high left frontal white matter. This is likely nonacute given lack of enhancement. Otherwise stable moderate volume chronic demyelinating disease. Susy Frizzle, MD  01/12/2019 9:12 AM    Mri Cervical Spine W Wo Contrast    Result Date: 01/12/2019  HISTORY: Multiple sclerosis. COMPARISON: 04/17/2018. TECHNIQUE: MR imaging of the cervical spine without and with contrast. 10 mL of Gadavist was administered intravenously. The study was performed as a dedicated demyelinating disease evaluation on 3T system. FINDINGS: There has been no significant interval change in extent of moderate volume, multifocal T2 hyperintense lesions within the cervical spinal cord. Specifically, stable more dominant lesions involving the left dorsal/lateral cord at C1 or C2, and the mid/ventral cord at C3, as well as smaller foci for example within the right cord at C4-C5, and the left lateral cord at C5. No discrete new cervical spinal cord foci are identified. There is no abnormal intramedullary enhancement. There is reversal of the normal cervical lordosis. The vertebral body alignment is within normal limits. The vertebral body heights  are maintained. There is no destructive osseous lesion. There is moderate degenerative spondylosis, not significantly changed in extent. Again there is degenerative disc disease primarily involving C4-C5, C5-C6, C6-C7. No significant change in moderate to severe left neural foraminal narrowing at C6-C7, moderate left neural foraminal narrowing at C5-C6, and mild to moderate multilevel spinal canal narrowing. The paravertebral soft tissues show no acute abnormality.     1. No significant interval change in extent of chronic demyelinating spinal cord lesions. No discrete new lesion or enhancing foci. 2. Stable degenerative changes. Susy Frizzle, MD  01/12/2019 9:12 AM    Pathology:   Specimens (From admission, onward)    None        Pending Lab Results:   Labs/Images to be followed at your PCP office:   Unresulted Labs     None        Hospitalist:   Signed by: Julien Girt  01/13/2019 4:49 PM  Time spent for  discharge: 35 minutes      *This note was generated by the Epic EMR system/ Dragon speech recognition and may contain inherent errors or omissions not intended by the user. Grammatical errors, random word insertions, deletions, pronoun errors and incomplete sentences are occasional consequences of this technology due to software limitations. Not all errors are caught or corrected. If there are questions or concerns about the content of this note or information contained within the body of this dictation they should be addressed directly with the author for clarification

## 2019-01-13 NOTE — Discharge Instr - AVS First Page (Addendum)
Reason for your Hospital Admission:  MS Exacerbation    Instructions for after your discharge:  Please follow-up with Dr. Dorene Grebe in 2 to 3 days to initiate treatment for multiple sclerosis

## 2019-01-13 NOTE — Plan of Care (Signed)
Problem: Safety  Goal: Patient will be free from injury during hospitalization  Outcome: Completed  Goal: Patient will be free from infection during hospitalization  Outcome: Completed     Problem: Pain  Goal: Pain at adequate level as identified by patient  Outcome: Completed     Problem: Side Effects from Pain Analgesia  Goal: Patient will experience minimal side effects of analgesic therapy  Outcome: Completed     Problem: Discharge Barriers  Goal: Patient will be discharged home or other facility with appropriate resources  Outcome: Completed     Problem: Psychosocial and Spiritual Needs  Goal: Demonstrates ability to cope with hospitalization/illness  Outcome: Completed

## 2019-01-13 NOTE — OT Eval Note (Signed)
[facility]  [address]  [place], [place]. [address]    Department of Rehabilitation Services  [phone]    Occupational Therapy Evaluation    Patient: Judy Freeman    MRN#: [id number]     [id number]    Time of treatment: Time Calculation  OT Received On: 01/13/19  Start Time: 1041  Stop Time: 1120  Time Calculation (min): 39 min  OT Visit Number: 1    Consult received for Judy Freeman for OT Evaluation and Treatment.  Patients medical condition is appropriate for Occupational therapy intervention at this time.    Assessment:   Judy Freeman is a 43 y.o. female admitted 01/10/2019.   Brief chart review completed including review of labs, review of imaging, review of vitals, review of H&P and physician progress notes and review of consulting physician notes .     Assessment: Appears to be at baseline for ADL's     Complexity Chart Review Performance Deficits Clinical Decision Making Hx/Comorbidities Assistance needed   Low Brief 1-3 Limited options None None (or at baseline)   Moderate Expanded 3-5 Several Options 1-2 Min/Mod assist (not at baseline)   High Extensive 5 or more Multiple options 3 or more Max/dependent (not at baseline     Therapy Diagnosis: None    Rehabilitation Potential: Prognosis: Good;With family(and continued OP therapy to address R shoulder tendonitis)      Plan:   OT Frequency Recommended: one time visit - therapy discontinued   Treatment Interventions: No skilled interventions needed at this time     Patient Goal  Patient Goal: to go home     Risks/Benefits/POC Discussed with Pt/Family: With patient    Goals: N/A due to no further skilled OT intervention warranted at this time.                                       Discharge Recommendations:   Based on today's session patient's discharge recommendation is the following: Discharge Recommendation: Home with supervision.  DME Recommended for Discharge: Tub transfer bench              Precautions and Contraindications:  Falls Risk          Medical Diagnosis: Multiple sclerosis exacerbation [G35]  Anemia due to other cause, not classified [D64.89]    History of Present Illness: Judy Freeman is a 43 y.o. female admitted on 01/10/2019 with "a past medical history as listed above who comes in today complaining of generalized body pain.  She states she relapsed at the end of September.  She has had generalized weakness and fatigue over her entire arms and legs.  She states the pain is been relentless and severe and burning.  She has right pain and has been diagnosed with tendinitis therefore is been in physical therapy.  She has had a constant headache with numbness on bilateral fingers and toes.  She feels like she has been having trouble with her speech over the last 1 month--have trouble understanding her sometimes.  She has black dots and floaters in the vision.  She has had palpitations because she cries due to severe pain--occasionally has emesis after crying severely.  She has the urge to pee but denies any dysuria, frequency, urgency or other.  She states she is been bedbound for the symptoms over the last 3 weeks.  She is able to ambulate but  stays in bed most the day.  He is not taking anything for pain at home.  She stopped taking all her multiple sclerosis medications 5 years ago because "I did not want to feel like a [place] pig" she is been off medication since.  She last had an MRI with neurology in January which showed progression of her disease--is in process of switching to a [facility] physician.   Denies any chest pain, shortness of breath, nausea, vomiting, diarrhea, urinary complaints, melena, or focal neurological deficits."-as per H & P Note.        Past Medical/Surgical History:  Past Medical History:   Diagnosis Date    Asthma     Breast lump 02/22/2017    painful lump left breast    Bulging lumbar disc     Calcific tendinitis     Disorder of thyroid     MS (multiple sclerosis)     Pneumonia 2011,  2012    Sepsis 2011      Past Surgical History:   Procedure Laterality Date    CESAREAN SECTION      CHOLECYSTECTOMY      DEBRIDEMENT & IRRIGATION, ABSCESS  2015         X-Rays/Tests/Labs:  Mri Brain W Wo Contrast    Result Date: 01/12/2019   There is a new T2 hyperintense focus without associated enhancement in the high left frontal white matter. This is likely nonacute given lack of enhancement. Otherwise stable moderate volume chronic demyelinating disease. Judy Frizzle, MD  01/12/2019 9:12 AM    Mri Cervical Spine W Wo Contrast    Result Date: 01/12/2019  1. No significant interval change in extent of chronic demyelinating spinal cord lesions. No discrete new lesion or enhancing foci. 2. Stable degenerative changes. Judy Frizzle, MD  01/12/2019 9:12 AM        Social History:  Prior Level of Function  Prior level of function: Independent with ADLs, Ambulates independently  Baseline Activity Level: Community ambulation  Driving: independent(only short distances )  Cooking: Yes  Home Living Arrangements  Living Arrangements: Spouse/significant other, Children(lives with wife & 3 children (21, 12yo twins))  Type of Home: Apartment  Home Layout: One level, Performs ADL's on one level, Stairs to enter with rails (add number in comment)(3 flights of stairs to enter )  Bathroom Shower/Tub: Electrical engineer: Hand-held shower  Home Living - Notes / Comments: per pt, she takes care of all home management tasks for the most part unless she can't; pt's wife works outside the home during the day; pt has been going to OP PT for tx of R shldr calcific tendinitis      Subjective:   Patient is agreeable to participation in the therapy session. Nursing clears patient for therapy.  Subjective: Patient reporting she is eager to go home today  Pain Assessment  Pain Assessment: No/denies pain.        Objective:   Observation of Patient/Vital Signs:  Patient is in bed with telemetry  in place.         Cognition/Neuro Status  Arousal/Alertness: Appropriate responses to stimuli  Attention Span: Appears intact  Orientation Level: Oriented X4  Memory: Appears intact  Following Commands: independent  Safety Awareness: independent  Insights: Fully aware of deficits  Behavior: attentive;calm;cooperative  Coordination: intact(for light touch)  Hand Dominance: right handed    Gross ROM  Right Upper Extremity ROM: within functional limits  Left Upper Extremity  ROM: within functional limits  Gross Strength  Right Upper Extremity Strength: 3+/5  Left Upper Extremity Strength: 3+/5          Sensory  Auditory: intact  Tactile - Light Touch: intact(for light touch B UEs however Patient states h/o N/T )  Visual Acuity: wears glasses       Self-care and Home Management  LB Dressing: Supervision;sitting;edge of bed;Don/doff R sock;Don/doff L sock    Mobility and Transfers  Supine to Sit: Modified Independent  Sit to Stand: Supervision(from bed)  Bed <-->Toilet Transfer: Supervision(w/o A.D.)  Functional Mobility in room: Supervision w/o A.D.     Balance  Static Sitting Balance: good  Dyanamic Sitting Balance: good  Static Standing Balance: good  Dynamic Standing Balance: good    Participation and Endurance  Participation Effort: good  Endurance: Tolerates 30 min exercise with intermittent rests; Patient denied feeling lightheaded or dizzy during the session.      AM-PAC "6 Clicks" Daily Activity Inpatient Short Form  Inpatient AM-PAC Performed?: yes  Put On/Take Off Lower Body Clothing: A little  Assist with Bathing: A little  Assist with Toileting: A little  Put On/Take Off Upper Body Clothing: A little  Assist with Grooming: A little  Assist with Eating: None  OT Daily Activity Raw Score: 19  CMS 0-100% Score: 42.80%      Treatment Activities: Patient demonstrated good understanding of the functional limitations that can present with M.S. and discussed various activity modifications and DME/A.E. use to assist  with energy conservation. Patient discussed importance of continuing to engage in roles, routines and leisure activites despite fatigue levels, mentioning that cooking is a meaningful activity for her. Problem solved strategies to continue engaging in these tasks despite current functional limitations (fatigue levels). Discussed use of built up grip handles for kitchen tools and discussed proper body mechanics, sensory protection techniques (when cutting foods) and joint protection techniques. Discussed with Patient home safety recommendations for bathroom use. Recommendations made for transfer tub bench for energy conservation and fall prevention. Also advised Patient to have Supervision with shower transfers and showering upon d/c to ensure safety. Patient provided with purchasing information on the above mentioned DME and provided with handout on bathroom safety and was receptive to same. Patient seated in arm-chair at end of session with all needs within reach. Patient instructed to ring for nursing for all needs and appeared receptive to all education provided. Chair Alarm activated for Patient's safety and RN notified of session outcome.        Educated the patient to role of occupational therapy, plan of care, goals of therapy and safety with mobility and ADLs, energy conservation techniques, home safety.          Therapist PPE during session procedural mask, gown  and gloves     Tennis Ship. Trixie Deis, MS,OTR/L  Pager # [phone]  [phone]

## 2019-05-01 ENCOUNTER — Encounter (INDEPENDENT_AMBULATORY_CARE_PROVIDER_SITE_OTHER): Payer: Self-pay | Admitting: Internal Medicine

## 2019-05-04 ENCOUNTER — Other Ambulatory Visit: Payer: Self-pay | Admitting: Family

## 2019-05-21 ENCOUNTER — Ambulatory Visit (INDEPENDENT_AMBULATORY_CARE_PROVIDER_SITE_OTHER): Payer: 59 | Admitting: Internal Medicine

## 2019-05-21 ENCOUNTER — Encounter (INDEPENDENT_AMBULATORY_CARE_PROVIDER_SITE_OTHER): Payer: Self-pay | Admitting: Internal Medicine

## 2019-05-21 VITALS — BP 128/90 | HR 92 | Temp 97.8°F | Ht 65.98 in | Wt 217.0 lb

## 2019-05-21 DIAGNOSIS — R5382 Chronic fatigue, unspecified: Secondary | ICD-10-CM

## 2019-05-21 DIAGNOSIS — E038 Other specified hypothyroidism: Secondary | ICD-10-CM

## 2019-05-21 DIAGNOSIS — E063 Autoimmune thyroiditis: Secondary | ICD-10-CM

## 2019-05-21 NOTE — Progress Notes (Signed)
Subjective:      Date: 05/21/2019 3:43 PM   Patient ID: Judy Freeman is a 44 y.o. female.    Referred by: Hurley Cisco, MD     Chief Complaint:  Chief Complaint   Patient presents with    Hypothyroidism       HPI  Visit type: [x]  Initial  []  Follow up   Type: Primary  Manifestation: No goiter present  Diagnosis: diagnosed several years ago and secondary to autoimmune thyroiditis (hashimoto's)  Last follow-up: 3 months ago and with the patient's primary care physician  Current symptoms: No current symptoms  All other symptoms reviewed and negative.  Exacerbating factors: none  Relieving factors: thyroid hormone replacement  Current medications: levothyroxine 50 mcg and reports good compliance with therapy    No current symptoms, Fever, hoarse voice, neck swelling/tenderness, dysphagia, cold intolerance, heat intolerance, dry hair, dry skin, brittle fingernails, constipation, diarrhea, depressed mood, fatigue,  menstrual irregularity, muscle cramps, palpitations, anxiety, weight loss, weight gain, confusion    Laboratory Testing:   Lab Results   Component Value Date    TSH 0.40 01/11/2019     Pertinent History: hashimoto's disease  Prior imaging: none       Last set of labs from 04/14/2019 is 1.8    Heavy menstrual cycles - has twins 13years ago.    Last October MS flare 1000mg  x 3 days iv form ---> did not discharge on steroids , but had a taper from her neurologist.  Recently started on kesimpta 4 months now 01/2019--ofatutumab.      Obesity:BMI 35.05.  No exercise  Breakfast:eggs sausage and grits , no consistent.  Lunch:vegetables and meat -not consistent with her meals.  Dinner:  vegetables and meat -not consistent with her meals.      Problem List:  Patient Active Problem List   Diagnosis    Multiple sclerosis exacerbation    Asthma    Hypothyroid       Current Medications:  Outpatient Medications Marked as Taking for the 05/21/19 encounter (Office Visit) with Paulita Fujita, MD   Medication Sig  Dispense Refill    baclofen (LIORESAL) 20 MG tablet Take 20 mg by mouth 3 (three) times daily      escitalopram (LEXAPRO) 20 MG tablet Take 20 mg by mouth daily      Levothyroxine Sodium 50 MCG Cap Take 50 mcg by mouth daily         Multiple Vitamin (multivitamin) capsule Take 1 capsule by mouth daily      vitamin D, ergocalciferol, (DRISDOL) 50000 UNIT Cap Take 50,000 Units by mouth once a week          Allergies:  Allergies   Allergen Reactions    Penicillins Hives       Past Medical History:  Past Medical History:   Diagnosis Date    Asthma     Breast lump 02/22/2017    painful lump left breast    Bulging lumbar disc     Calcific tendinitis     Disorder of thyroid     MS (multiple sclerosis)     Pneumonia 2011, 2012    Sepsis 2011   -     Past Surgical History:  Past Surgical History:   Procedure Laterality Date    CESAREAN SECTION      CHOLECYSTECTOMY      DEBRIDEMENT & IRRIGATION, ABSCESS  2015       Family History:  Family History   Problem  Relation Age of Onset    Cancer Father     Diabetes Maternal Aunt     Cancer Maternal Aunt     Breast cancer Maternal Aunt        Social History:  Social History     Tobacco Use    Smoking status: Never Smoker    Smokeless tobacco: Never Used   Substance Use Topics    Alcohol use: Yes     Comment: occasionally    Drug use: Not Currently     Types: Marijuana     Comment: for her MS- medical marijuana        The following sections were reviewed this encounter by the provider:   Tobacco   Allergies   Meds   Problems   Med Hx   Surg Hx   Fam Hx               Vitals:  BP 128/90    Pulse 92    Temp 97.8 F (36.6 C)    Ht 1.676 m (5' 5.98")    Wt 98.4 kg (217 lb)    BMI 35.05 kg/m       ROS:  Review of Systems   General/Constitutional:   Denies Chills.Denies Fever.  Ophthalmologic:   Denies Blurred vision. Denies Eye Pain.   ENT:   Denies Nasal Discharge. Denies Ear pain. Denies Sinus pain.   Endocrine:   Denies Polydipsia. Denies Polyuria. Denies  Polyphagia,Denies Excess Fatigue,Denies weight loss/gain  Respiratory:   Denies Cough. Denies Orthopnea. Denies Shortness of breath. Denies Wheezing.   Cardiovascular:   Denies Chest pain. Denies Chest pain with exertion.. Denies Palpitations. Denies Swelling in hands/feet.   Gastrointestinal:   Denies Abdominal pain. Denies Blood in stool. Denies Constipation. Denies Diarrhea.Denies Heartburn. Denies Nausea. Denies Vomiting.   Genitourinary:   Denies Blood in urine. Denies Frequent urination.   Musculoskeletal:   Denies Leg cramps. Denies Muscle aches.   Skin:   Denies Skin lesion(s) or Rash.  Neurologic:   Denies Dizziness. Denies Gait abnormality. Denies Headache. Denies Tingling/Numbness.        Objective:       Physical Exam:    GENERAL APPEARANCE: alert, in no acute distress, well developed, well nourished,oriented to time, place, and person.   NOSE: normal nasal mucosa.   ORAL CAVITY: normal oropharynx, normal lips, mucosa moist.   THROAT: normal appearance, clear.   NECK/THYROID: neck supple,  no cervical lymphadenopathy,no thyromegaly.  HEART: S1, S2 normal, no murmurs, regular rate and rhythm.   LUNGS: normal effort / no distress, normal breath sounds, clear to auscultation bilaterally, no wheezes.  EXTREMITIES: no clubbing, cyanosis, or edema B/L.   FEET: no calluses/hyperpigmentation/ DP/PD+ , good nail hygiene      Lab Results   Component Value Date    TSH 0.40 01/11/2019           Assessment:       1. Hypothyroidism due to Hashimoto's thyroiditis        Plan:     Hypothyroidism:  Pt is clinically euthyroid.  Prior TFT's are within target range. Continue current thyroid replacement medication regimen. Repeat surveillance TFT's in 3-6 months.    Counseled the patient regarding Levothyroxine and appropriate administration considerations. It was explained that it should be taken daily on an empty stomach 30-60 minutes before breakfast and separated from supplements (such as multivitamins, calcium, and  iron) by at least four hours.  Antacids, such as Nexium, Prilosec, Protonix, should  be taken at least one hour apart from levothyroxine and caffeine should be avoided for 30-60 minutes following levothyroxine    We discussed that replacement medication dosages may need to be increased during pregnancy.  In general, an increase in dose of 25-50% should be anticipated for women who are on thyroid replacement. This corresponds to 2 additional tablets per week for those who take one tab daily (29% increase) or an increased daily dose (25-50%).  I recommended that we add 2 tablets per week and TSH and FT4 should be performed in 4 weeks and every ~4 wks until 20 wks, then at least once 26-32 wks.       Elevated BMI plan:     Begin to track calories, using myfitness pal or other calorie tracking system   Track steps if possible, goal steps are 10,000   Goal exercise 30 minutes a day 5 days a week   Eliminate fast food, sodas or sugar sweetened beverages, reduce carbohydrates, refined sugars, processed foods, increase protein (emphasis on plant based proteins if possible), reduce saturated fats and red meats   Try intermittent fasting- eating for 8 hours of the day, and fasting for 16 hours ( ie. Eat only 11 am- 7 pm, or 10 am-6 pm and fast the remainder of the day)       Chronic fatigue :  Labs as above.  Follow-up:   Return in about 3 months (around 08/18/2019) for follow up.     Paulita Fujita, MD

## 2019-05-26 ENCOUNTER — Other Ambulatory Visit: Payer: Self-pay | Admitting: Family

## 2019-06-14 LAB — HEMOGLOBIN A1C: Hemoglobin A1C: 5.6 % of total Hgb (ref <5.7)

## 2019-06-14 LAB — T4, FREE: T4 Free: 1.1 ng/dL (ref 0.8–1.8)

## 2019-06-14 LAB — LIPID PANEL
Cholesterol / HDL Ratio: 3.7 (calc) (ref <5.0)
Cholesterol: 237 mg/dL — ABNORMAL HIGH (ref <200)
HDL: 64 mg/dL (ref >=50)
LDL Calculated: 149 mg/dL (calc) — ABNORMAL HIGH
NON HDL CHOLESTEROL: 173 mg/dL (calc) — ABNORMAL HIGH (ref <130)
Triglycerides: 119 mg/dL (ref <150)

## 2019-06-14 LAB — TSH: TSH: 1.6 mIU/L

## 2019-06-14 LAB — T3, FREE: T3, Free: 2.6 pg/mL (ref 2.3–4.2)

## 2019-06-16 NOTE — Progress Notes (Signed)
Hi Judy Freeman:    The results from your recent office visit have returned for your review. (Minor variances, if any, outside of the expected range are not considered clinically significant)    The following lab results are within acceptable limits: -  Thyroid function testing (TSH/FT3 or FT4) is within acceptable limits.  -continue current dose of thyroid hormone replacement.    Best,  Paulita Fujita, MD

## 2019-11-18 ENCOUNTER — Encounter (INDEPENDENT_AMBULATORY_CARE_PROVIDER_SITE_OTHER): Payer: Self-pay | Admitting: Internal Medicine

## 2019-11-18 DIAGNOSIS — R5382 Chronic fatigue, unspecified: Secondary | ICD-10-CM

## 2020-03-30 ENCOUNTER — Other Ambulatory Visit: Payer: Self-pay | Admitting: Neurology

## 2020-04-02 ENCOUNTER — Emergency Department
Admission: EM | Admit: 2020-04-02 | Discharge: 2020-04-02 | Disposition: A | Discharge status: Home / Self Care | Payer: BC Managed Care – PPO | Attending: Emergency Medicine | Admitting: Emergency Medicine

## 2020-04-02 ENCOUNTER — Emergency Department: Payer: BC Managed Care – PPO

## 2020-04-02 DIAGNOSIS — Z20822 Contact with and (suspected) exposure to covid-19: Secondary | ICD-10-CM

## 2020-04-02 DIAGNOSIS — R197 Diarrhea, unspecified: Secondary | ICD-10-CM

## 2020-04-02 DIAGNOSIS — B349 Viral infection, unspecified: Secondary | ICD-10-CM

## 2020-04-02 DIAGNOSIS — U071 COVID-19: Secondary | ICD-10-CM | POA: Insufficient documentation

## 2020-04-02 LAB — CBC AND DIFFERENTIAL
Absolute NRBC: 0 10*3/uL (ref 0.00–0.00)
Basophils Absolute Automated: 0.04 10*3/uL (ref 0.00–0.08)
Basophils Automated: 1.6 %
Eosinophils Absolute Automated: 0.03 10*3/uL (ref 0.00–0.44)
Eosinophils Automated: 1.2 %
Hematocrit: 36.3 % (ref 34.7–43.7)
Hgb: 11.5 g/dL (ref 11.4–14.8)
Immature Granulocytes Absolute: 0 10*3/uL (ref 0.00–0.07)
Immature Granulocytes: 0 %
Lymphocytes Absolute Automated: 0.36 10*3/uL — ABNORMAL LOW (ref 0.42–3.22)
Lymphocytes Automated: 14.2 %
MCH: 27.5 pg (ref 25.1–33.5)
MCHC: 31.7 g/dL (ref 31.5–35.8)
MCV: 86.8 fL (ref 78.0–96.0)
MPV: 10.1 fL (ref 8.9–12.5)
Monocytes Absolute Automated: 0.52 10*3/uL (ref 0.21–0.85)
Monocytes: 20.5 %
Neutrophils Absolute: 1.59 10*3/uL (ref 1.10–6.33)
Neutrophils: 62.5 %
Nucleated RBC: 0 /100 WBC (ref 0.0–0.0)
Platelets: 337 10*3/uL (ref 142–346)
RBC: 4.18 10*6/uL (ref 3.90–5.10)
RDW: 14 % (ref 11–15)
WBC: 2.54 10*3/uL — ABNORMAL LOW (ref 3.10–9.50)

## 2020-04-02 LAB — COMPREHENSIVE METABOLIC PANEL
ALT: 12 U/L (ref 0–55)
AST (SGOT): 17 U/L (ref 5–34)
Albumin/Globulin Ratio: 1.2 (ref 0.9–2.2)
Albumin: 4.6 g/dL (ref 3.5–5.0)
Alkaline Phosphatase: 67 U/L (ref 37–106)
Anion Gap: 12 (ref 5.0–15.0)
BUN: 7.1 mg/dL (ref 7.0–19.0)
Bilirubin, Total: 0.8 mg/dL (ref 0.2–1.2)
CO2: 22 mEq/L (ref 22–29)
Calcium: 9.2 mg/dL (ref 8.5–10.5)
Chloride: 103 mEq/L (ref 100–111)
Creatinine: 0.8 mg/dL (ref 0.6–1.0)
Globulin: 3.9 g/dL — ABNORMAL HIGH (ref 2.0–3.6)
Glucose: 95 mg/dL (ref 70–100)
Potassium: 3.7 mEq/L (ref 3.5–5.1)
Protein, Total: 8.5 g/dL — ABNORMAL HIGH (ref 6.0–8.3)
Sodium: 137 mEq/L (ref 136–145)

## 2020-04-02 LAB — GROUP A STREP, RAPID ANTIGEN: Group A Strep, Rapid Antigen: NEGATIVE

## 2020-04-02 LAB — TROPONIN I: Troponin I: <0.01 ng/mL (ref 0.00–0.05)

## 2020-04-02 LAB — SEDIMENTATION RATE: Sed Rate: 53 mm/Hr — ABNORMAL HIGH (ref 0–20)

## 2020-04-02 LAB — GFR: EGFR: >60

## 2020-04-02 LAB — HCG, SERUM, QUALITATIVE: Hcg Qualitative: NEGATIVE

## 2020-04-02 MED ORDER — ONDANSETRON 4 MG PO TBDP
4.0000 mg | ORAL_TABLET | Freq: Three times a day (TID) | ORAL | 0 refills | Status: AC | PRN
Start: 2020-04-02 — End: 2020-04-09

## 2020-04-02 MED ORDER — DICYCLOMINE HCL 20 MG PO TABS
20.0000 mg | ORAL_TABLET | Freq: Three times a day (TID) | ORAL | 0 refills | Status: AC | PRN
Start: 2020-04-02 — End: ?

## 2020-04-02 MED ORDER — IBUPROFEN 600 MG PO TABS
600.0000 mg | ORAL_TABLET | Freq: Once | ORAL | Status: AC
Start: 2020-04-02 — End: 2020-04-02
  Administered 2020-04-02: 14:00:00 600 mg via ORAL
  Filled 2020-04-02: qty 1

## 2020-04-02 NOTE — Discharge Instructions (Signed)
Diarrhea    You have been diagnosed with diarrhea.    You have diarrhea when you have stools (bowel movements) that are soft or liquid, or when you have too many stools in a day. You may also have stomach cramps/ pains, nausea (feeling sick to your stomach), vomiting and fever (temperature higher than 100.63F / 38C).    Diarrhea can be caused by bacteria, viruses and parasites. People sometimes get traveler's diarrhea. They get it when they go to other countries. It is caused by bacteria.    People with diarrhea often lose a lot of body fluids. This causes dehydration. Drink a lot of water or other fluids to stay hydrated. You may also be given medicine for your diarrhea. It will slow the diarrhea and stop the vomiting (if you have this symptom).    You should drink lots of natural juices or a sports-type drink that has electrolytes (sodium, potassium, etc). Do not drink a lot of plain water. Sugary drinks like apple and pear juice might make the diarrhea worse.    You might be given medicine to help you with your diarrhea, nausea (feeling sick), vomiting and stomach cramps. This will depend on your age, medical history and symptoms.    It is OK for you to go home today.    Good hygiene (keeping clean) helps to keep the problem from spreading. Please wash your hands often, using soap and water, especially after using the bathroom. Do not prepare food or share food, drinks or utensils (forks, knives, etc.) with other people.    It is very important that you follow up with your regular doctor or a specialist. The doctor will tell you how soon this follow-up needs to be.    YOU SHOULD SEEK MEDICAL ATTENTION IMMEDIATELY, EITHER HERE OR AT THE NEAREST EMERGENCY DEPARTMENT, IF ANY OF THE FOLLOWING OCCURS:   You are vomiting and cannot keep down fluids.   There is blood, pus or mucous in your stool.   You have symptoms of dehydration. These include dry mouth, not urinating (peeing) at least once every eight  hours, feeling dizzy/lightheaded, severe weakness or passing out.       Viral Syndrome    You have been diagnosed with a viral syndrome. This is also known as a "cold."    Some symptoms of a viral syndrome are fever (temperature higher than 100.63F / 38C) and headache. There may be stuffy nose, nasal congestion and sore throat. Cough and muscle aches are other symptoms. Sometimes there is a rash. Some people may also have nausea (sick to the stomach). Some have vomiting (throwing up) and/or diarrhea (loose stool).    Unfortunately, there is still no cure for the common cold! Antibiotics DO NOT work for viral illnesses. They may cause side-effects like rash or diarrhea (loose stool). Also, when antibiotics are used when not needed, they may not work for future bacterial infections. Most patients get better with simple things like rest, fluids and a little time. Acetaminophen (Tylenol) and/or ibuprofen (Advil or Motrin) can also help.    NEVER take ASPIRIN products with a viral infection. Aspirin may cause liver failure with certain viral infections.    Routine follow-up with your primary care doctor is recommended.    YOU SHOULD SEEK MEDICAL ATTENTION IMMEDIATELY, EITHER HERE OR AT THE NEAREST EMERGENCY DEPARTMENT, IF ANY OF THE FOLLOWING OCCURS:   Severe headache or stiff neck.   Uncontrolled vomiting.   Lightheadedness, feeling faint, or passing out.  Trouble breathing or shortness of breath.   Weakness or not able to walk.   Any other concerning symptoms or concerns.   You don't get better over 7 days or feel worse at any time.             Follow-up Steps for Patients with Pending COVID-19 Testing     Thank you for choosing [facility].  During your visit you received a test for COVID-19 and the test performed takes 2-3 days to result.  Occasionally it can take longer.  We appreciate your patience.      What should I do while I am waiting for my test results?     ? Take good care of  yourself and be mindful of the safety of those around you:  ? Rest and stay well hydrated.  ? Use acetaminophen (Tylenol) to help manage fever.  ? Wash your hands often.  ? Stay at home except to get medical care.  ? Call ahead before seeing your doctor, any other medical provider, or seeking medical care at any healthcare facility.  ? Isolate yourself as much as possible.  ? Clean all "high touch" surfaces every day.  ? Most people with COVID-19 infection will get better; however, a small number will develop worsening infection which may require more intensive treatment.  Please contact your doctor or present to the nearest Emergency Department if you develop the warning signs of more severe infection such as:    ? A fever over 102 not controlled by acetaminophen (Tylenol)  ? Shortness of breath or chest pain  ? Worsening cough  ? Increasing weakness  ? Inability to tolerate oral fluids  ? Some general things to think about:  -      If you have a medical emergency and need to call 911, notify the person answering the phone that you have, or are being evaluated for COVID-19. If possible, put on a facemask before the ambulance arrives.    -      Persons who are placed under active monitoring or facilitated self-monitoring should follow instructions provided by their local health department or occupational health professionals, as appropriate.      When will I know the results of my COVID test?     After a minimum of 2-3 days has passed, there are multiple ways in which you can be informed about the results of your COVID 19 test results.    MyChart - you may be notified of results through your MyChart account.  The easiest way to find all test results done at an [facility] facility is through your MyChart account, [facility] online patient portal. This is also an easy way to connect with your [facility] primary care team for continued care, if this applies to you.  If you do not already have MyChart activated, you will be given an  activation code at the time of testing.      You may receive a call from a representative of the emergency department who ordered the test with the results.  You may also receive a call from [facility].    If none of the options above has helped you obtain your results, or you have additional questions, please contact the COVID 19 Notifications Call Center for further instructions at [phone], open 5 days a week, Monday-Friday from 8:30am-5:00pm.    If you need help activating your MyChart account, please let us know.  You can also download the MyChart application to  your phone using the QR code below.           When can I stop isolation?    Until you receive your results, you should remain home and avoid contact with others (self-isolate).  The decision to stop isolation precautions should be made on a case-by-case basis, in consultation with healthcare providers and state and local health departments.    Here is some general advice:     If your test result is negative, you should stay home until you do not have a fever (without the use of fever reducing medication) or any respiratory symptoms for at least 3 days.     If your test result is positive, you should stay home for at least 10 days from the day your symptoms started. If you are still having symptoms at the end of 10 days, continue in-home isolation until 3 days after your symptoms stop. Talk to your healthcare provider to determine what follow-up is needed.  You can also schedule telemedicine visits with your primary care physician to discuss ongoing symptoms or new concerns that may arise.    For further information on what you and others in your household should do please visit the Center for Disease Control (CDC) website @ [employer].si.html    If you are ill and need to be seen by a healthcare provider, and your primary physician is not available, the [facility] are open for walk in  visits.  [facility] - open M - F 8 am to 8 pm at the following locations:  [facility]: [address] [place], [place] [address]  Open Daily 8am - 8pm including weekends at the following location:  [facility]: [address], [place], [place] [address]    In addition, all [facility] Emergency Departments can care for patients with all conditions, both COVID and non-COVID.  Do not hesitate to seek care should you have a healthcare emergency.

## 2020-04-02 NOTE — ED Provider Notes (Signed)
History     Chief Complaint   Patient presents with    Chest Pain    Flu like symptoms     ED PHYSICIAN ASSIGNED     Date/Time Event User Comments    04/02/20 1113 Physician Assigned Raymond Gurney, MD assigned as Attending            Historian: Patient    Chief Complaint: Cough, fever  Location:    Timing: Yesterday  Severity: 100 F  Quality:    Modifying Factors:    Associated signs and symptoms: Congestion, sore throat, abdominal cramping, diarrhea, fatigue, myalgias    HPI: 45 year old female presenting with complaint of URI symptoms associated with sore throat, abdominal cramping, diarrhea for the past day.  Diarrhea started 3 days ago.  Has not been vaccine for COVID as she has had to time it with her ongoing MS treatment.          Past Medical History:   Diagnosis Date    Asthma     Breast lump 02/22/2017    painful lump left breast    Bulging lumbar disc     Calcific tendinitis     Disorder of thyroid     MS (multiple sclerosis)     Pneumonia 2011, 2012    Sepsis 2011       Past Surgical History:   Procedure Laterality Date    CESAREAN SECTION      CHOLECYSTECTOMY      DEBRIDEMENT & IRRIGATION, ABSCESS  2015       Family History   Problem Relation Age of Onset    Cancer Father     Diabetes Maternal Aunt     Cancer Maternal Aunt     Breast cancer Maternal Aunt        Social  Social History     Tobacco Use    Smoking status: Never Smoker    Smokeless tobacco: Never Used   Vaping Use    Vaping Use: Never used   Substance Use Topics    Alcohol use: Yes     Comment: occasionally    Drug use: Not Currently     Types: Marijuana     Comment: for her MS- medical marijuana       .     Allergies   Allergen Reactions    Penicillins Hives       Home Medications     Med List Status: In Progress Set By: Donaciano Eva, RN at 04/02/2020 11:37 AM                baclofen (LIORESAL) 20 MG tablet     Take 20 mg by mouth 3 (three) times daily     butalbital-acetaminophen-caffeine  (FIORICET) 50-325-40 MG per tablet     Take 1 tablet by mouth every 6 (six) hours as needed for Headaches     Cholecalciferol (Vitamin D3) 50 MCG (2000 UT) Tab     Take by mouth     escitalopram (LEXAPRO) 20 MG tablet     Take 20 mg by mouth daily     glucose blood test strip     1 each by Other route as needed for Other Use as instructed     HYDROcodone-acetaminophen (NORCO) 5-325 MG per tablet     Take 1 tablet by mouth every 6 (six) hours as needed     ibuprofen (ADVIL) 800 MG tablet     Take 800 mg by mouth every  6 (six) hours as needed for Pain     Levothyroxine Sodium 50 MCG Cap     Take 50 mcg by mouth daily        Multiple Vitamin (multivitamin) capsule     Take 1 capsule by mouth daily     vitamin D, ergocalciferol, (DRISDOL) 50000 UNIT Cap     Take 50,000 Units by mouth once a week           Review of Systems   Constitutional: Positive for fatigue and fever.   HENT: Positive for congestion and sore throat.    Respiratory: Positive for cough. Negative for shortness of breath.    Cardiovascular: Positive for chest pain ( burning). Negative for palpitations.   Gastrointestinal: Positive for abdominal pain and diarrhea. Negative for vomiting.   Musculoskeletal: Positive for myalgias.   All other systems reviewed and are negative.      Physical Exam    BP: 136/78, Heart Rate: 82, Temp: 97.8 F (36.6 C), Resp Rate: 14, SpO2: 100 %, Weight: 84.8 kg     Physical Exam  Constitutional:       Appearance: She is well-developed.   HENT:      Head: Normocephalic and atraumatic.   Eyes:      General: No scleral icterus.        Right eye: No discharge.         Left eye: No discharge.      Conjunctiva/sclera: Conjunctivae normal.   Neck:      Vascular: No JVD.      Trachea: No tracheal deviation.   Cardiovascular:      Rate and Rhythm: Normal rate and regular rhythm.      Heart sounds: Normal heart sounds. No murmur heard.  No friction rub. No gallop.    Pulmonary:      Effort: Pulmonary effort is normal. No respiratory  distress.      Breath sounds: Normal breath sounds. No stridor. No wheezing or rales.   Abdominal:      General: Bowel sounds are normal. There is no distension.      Palpations: Abdomen is soft. There is no mass.      Tenderness: There is abdominal tenderness ( diffuse). There is no guarding or rebound.   Musculoskeletal:         General: No tenderness. Normal range of motion.   Skin:     General: Skin is warm and dry.   Neurological:      Mental Status: She is alert and oriented to person, place, and time.      GCS: GCS eye subscore is 4. GCS verbal subscore is 5. GCS motor subscore is 6.      Sensory: No sensory deficit.   Psychiatric:         Speech: Speech normal.         Behavior: Behavior normal.         Thought Content: Thought content normal.         Judgment: Judgment normal.           MDM and ED Course     ED Medication Orders (From admission, onward)    Start Ordered     Status Ordering Provider    04/02/20 1337 04/02/20 1336  ibuprofen (ADVIL) tablet 600 mg  Once        Route: Oral  Ordered Dose: 600 mg     Last MAR action: Given Oluwatomisin Deman  MDM    I, Earline Mayotte MD, have been the primary provider for Judy Freeman during this Emergency Dept visit.      DDx includes, but is not limited to: COVID, upper respiratory infection, influenza    Initial Plan: Labs, chest x-ray, EKG    When I was within 6 feet of this patient I donned the following PPE:  Surgical Mask No  , Gloves Yes, Gown No; Goggles Yes; Face Shield No, 22M 6000 Respirator Yes; N95 No  .  The patient was wearing a mask during my evaluation Yes.      EKG Interpretation    This EKG Interpretation was documented by Earline Mayotte, the ED physician    Rhythm: normal sinus   Rate: normal  Axis: normal  Ectopy: none  Conduction: normal  ST Segments: no acute change  T Waves: Nonspecific T wave flattening noted in leads V4 through V6 as well as 2, 3, aVF as well as T wave inversion noted in V3 Q Waves: none    Clinical  Impression: Nonspecific EKG         Results of labs and imaging discussed with patient.  Patient is appropriate for outpatient follow-up.  Understands return for worsening symptoms.    Results     Procedure Component Value Units Date/Time    Rapid influenza A/B antigens [[id number]] Collected: 04/02/20 1147    Specimen: Nasopharyngeal Swab from Nasal Aspirate Updated: 04/02/20 1230    Narrative:      ORDER#: [id number]                                    ORDERED BY: Dijon Kohlman, JAGADI  SOURCE: Nasal Aspirate                               COLLECTED:  04/02/20 11:47  ANTIBIOTICS AT COLL.:                                RECEIVED :  04/02/20 11:56  Influenza Rapid Antigen A&B                FINAL       04/02/20 12:30  04/02/20   Negative for Influenza A and B             Reference Range: Negative      Rapid Strep (Group A Antigen) [[id number]] Collected: 04/02/20 1147    Specimen: Throat Updated: 04/02/20 1227     Group A Strep, Rapid Antigen Negative    Troponin I [[id number]] Collected: 04/02/20 1127    Specimen: Blood Updated: 04/02/20 1214     Troponin I <0.01 ng/mL     GFR [[id number]] Collected: 04/02/20 1127     Updated: 04/02/20 1212     EGFR >60.0    Comprehensive metabolic panel [[id number]]  (Abnormal) Collected: 04/02/20 1127    Specimen: Blood Updated: 04/02/20 1212     Glucose 95 mg/dL      BUN 7.1 mg/dL      Creatinine 0.8 mg/dL      Sodium 536 mEq/L      Potassium 3.7 mEq/L      Chloride 103 mEq/L      CO2 22 mEq/L      Calcium 9.2 mg/dL      Protein, Total  8.5 g/dL      Albumin 4.6 g/dL      AST (SGOT) 17 U/L      ALT 12 U/L      Alkaline Phosphatase 67 U/L      Bilirubin, Total 0.8 mg/dL      Globulin 3.9 g/dL      Albumin/Globulin Ratio 1.2     Anion Gap 12.0    Sedimentation rate (ESR) [[id number]]  (Abnormal) Collected: 04/02/20 1127    Specimen: Blood Updated: 04/02/20 1208     Sed Rate 53 mm/Hr     Beta HCG, Qual, Serum [[id number]] Collected: 04/02/20 1127    Specimen: Blood Updated: 04/02/20 1205     Hcg  Qualitative Negative    COVID-19 (SARS-CoV-2) ([facility] Standard test) [[id number]] Collected: 04/02/20 1147    Specimen: Nasopharyngeal Swab from Nasopharynx Updated: 04/02/20 1157     Purpose of COVID testing Diagnostic -PUI     SARS-CoV-2 Specimen Source Nasal Swab    Narrative:      o Collect and clearly label specimen type:  o PREFERRED-Upper respiratory specimen: One Nasal Swab in  [employer] Media.  o Hand deliver to laboratory ASAP  Diagnostic -[facility]I    CBC and differential [[id number]]  (Abnormal) Collected: 04/02/20 1127    Specimen: Blood Updated: 04/02/20 1149     WBC 2.54 x10 3/uL      Hgb 11.5 g/dL      Hematocrit 62.9 %      Platelets 337 x10 3/uL      RBC 4.18 x10 6/uL      MCV 86.8 fL      MCH 27.5 pg      MCHC 31.7 g/dL      RDW 14 %      MPV 10.1 fL      Neutrophils 62.5 %      Lymphocytes Automated 14.2 %      Monocytes 20.5 %      Eosinophils Automated 1.2 %      Basophils Automated 1.6 %      Immature Granulocytes 0.0 %      Nucleated RBC 0.0 /100 WBC      Neutrophils Absolute 1.59 x10 3/uL      Lymphocytes Absolute Automated 0.36 x10 3/uL      Monocytes Absolute Automated 0.52 x10 3/uL      Eosinophils Absolute Automated 0.03 x10 3/uL      Basophils Absolute Automated 0.04 x10 3/uL      Immature Granulocytes Absolute 0.00 x10 3/uL      Absolute NRBC 0.00 x10 3/uL     Blood Culture Aerobic/Anaerobic #1 [[id number]] Collected: 04/02/20 1127    Specimen: Arm from Blood, Venipuncture Updated: 04/02/20 1145    Narrative:      1 BLUE+1 PURPLE    Blood Culture Aerobic/Anaerobic #2 [[id number]] Collected: 04/02/20 1127    Specimen: Arm from Blood, Venipuncture Updated: 04/02/20 1145    Narrative:      1 BLUE+1 PURPLE          Radiology Results (24 Hour)     Procedure Component Value Units Date/Time    XR Chest  AP Portable [[id number]] Collected: 04/02/20 1240    Order Status: Completed Updated: 04/02/20 1245    Narrative:      HISTORY: Chest pain.     COMPARISON: Chest radiographs 04/29/2018.    TECHNIQUE: AP  chest radiograph.    FINDINGS:    There is no focal consolidation, pneumothorax or pleural effusion. The  cardiomediastinal silhouette is within normal limits. No acute osseous  abnormality.      Impression:          No acute osseous abnormality.    Nonda Lou, MD   04/02/2020 12:43 PM                Procedures    Clinical Impression & Disposition     Clinical Impression  Final diagnoses:   Viral syndrome   Diarrhea, unspecified type   Encounter for laboratory testing for COVID-19 virus        ED Disposition     ED Disposition Condition Date/Time Comment    Discharge  Fri Apr 02, 2020  1:06 PM Judy Freeman discharge to home/self care.    Condition at disposition: Stable           Discharge Medication List as of 04/02/2020  1:06 PM      START taking these medications    Details   dicyclomine (BENTYL) 20 MG tablet Take 1 tablet (20 mg total) by mouth every 8 (eight) hours as needed (abdominal pain), Starting Fri 04/02/2020, E-Rx      ondansetron (Zofran ODT) 4 MG disintegrating tablet Take 1 tablet (4 mg total) by mouth every 8 (eight) hours as needed for Nausea, Starting Fri 04/02/2020, Until Fri 04/09/2020 at 2359, E-Rx

## 2020-04-02 NOTE — ED Triage Notes (Signed)
Pt arrives with c/o heavy/burning chest pain, scratchy/sore throat, cough that began last night. Pt not vaccinated for covid or flu. Pt reports wife with chest cold, hasn't been tested for covid.

## 2020-04-03 LAB — COVID-19 (SARS-COV-2): SARS CoV 2 Overall Result: DETECTED — AB

## 2020-04-04 LAB — ECG 12-LEAD
Atrial Rate: 90 {beats}/min
P Axis: 36 degrees
P-R Interval: 164 ms
Q-T Interval: 366 ms
QRS Duration: 84 ms
QTC Calculation (Bezet): 447 ms
R Axis: 28 degrees
T Axis: 11 degrees
Ventricular Rate: 90 {beats}/min

## 2020-06-24 ENCOUNTER — Ambulatory Visit (INDEPENDENT_AMBULATORY_CARE_PROVIDER_SITE_OTHER): Payer: BC Managed Care – PPO | Admitting: Neurology

## 2020-06-29 ENCOUNTER — Ambulatory Visit (INDEPENDENT_AMBULATORY_CARE_PROVIDER_SITE_OTHER): Payer: BC Managed Care – PPO | Admitting: Neurology

## 2020-06-29 ENCOUNTER — Encounter (INDEPENDENT_AMBULATORY_CARE_PROVIDER_SITE_OTHER): Payer: Self-pay | Admitting: Neurology

## 2020-06-29 VITALS — BP 113/77 | HR 76 | Ht 66.0 in | Wt 183.0 lb

## 2020-06-29 DIAGNOSIS — R202 Paresthesia of skin: Secondary | ICD-10-CM

## 2020-06-29 DIAGNOSIS — G35 Multiple sclerosis: Secondary | ICD-10-CM

## 2020-06-29 DIAGNOSIS — M62838 Other muscle spasm: Secondary | ICD-10-CM

## 2020-06-29 DIAGNOSIS — Z79899 Other long term (current) drug therapy: Secondary | ICD-10-CM

## 2020-06-29 DIAGNOSIS — G4726 Circadian rhythm sleep disorder, shift work type: Secondary | ICD-10-CM

## 2020-06-29 DIAGNOSIS — F4310 Post-traumatic stress disorder, unspecified: Secondary | ICD-10-CM

## 2020-06-29 DIAGNOSIS — D84821 Immunodeficiency due to drugs: Secondary | ICD-10-CM

## 2020-06-29 MED ORDER — MODAFINIL 100 MG PO TABS
ORAL_TABLET | ORAL | 1 refills | Status: AC
Start: 2020-06-29 — End: 2020-10-04

## 2020-06-29 NOTE — Patient Instructions (Addendum)
LABS  Any [facility] location. Can go to [facility] central lab on [address] or [employer].  [email or web address]    Options include:  [address], [place], [place] [address]  Phone: [phone]  [address], [place] [address] Phone: [phone]  [address] Floor, [place], [place] [address]  -----------------    Lora Havens, MD PHD  Director, Neuroimmunology & MS Center  [facility]    [address], [address], [place], [place] [address]  Main line / after-hours on-call: [phone]      For direct access, contact Gaylyn Cheers RN  Tel [phone]   Fax [phone]

## 2020-06-29 NOTE — Progress Notes (Signed)
IN-PERSON    MULTIPLE SCLEROSIS / NEUROIMMUNOLOGY CONSULT PATIENT  Primary (referring) neurologist: Dr Caleen Essex        CC: Neuroimmunologic Evaluation    HPI:  History obtained from patient,     45 y.o. year-old female with diagnosis as per CC above.    She had migraines, visual floaters as a teenager  She had parasthesias in her 19's. No bladder issues    Dx with MS at age 38 - 72- sensory changes in extremities. She had perineal numbness. She was referred to neurology.    Her primary neurologist is Dr Dorene Grebe in [place], but she had been co-managed at [facility] for MS care. She is wishing to transfer MS care to [place], while continuing to follow with Dr Dorene Grebe.    Review of records reveals (in summary):  04/02/20- chest pain, congestion, sore throat.     Multiple Sclerosis Specific Symptoms (new or changed from last visit):  Uthoff (symptoms worsened by heat or extreme cold):     Energy: fatigue, headaches  Cognition: short term memory difficulty.  Psychiatric:     Vision:   Brainstem:   Sensory / Pain: diffuse pain & parasthesias.  Bowel / Bladder:  Bladder urgency    Weakness, Spasms:   Ambulation distance, Support, Falls: Marland Kitchen     MS Medications (See A/P section for other medications)  Current Disease Modifying Therapy: kesimpta (06/2019)  Failed DMTs: copaxone, Ashok Cordia ("flushed & hot")    EXAM  Visit Vitals  BP 113/77 (BP Site: Left arm, Patient Position: Sitting, Cuff Size: Large)   Pulse 76   Ht 1.676 m (5\' 6" )   Wt 83 kg (183 lb)   LMP 06/24/2020 (Approximate)   BMI 29.54 kg/m     General:   Fundoscopic:   Psychiatric.   Mental Status: The patient was awake, alert, appropriate  Cranial Nerves: CN II-XII normal  Motor: 5/5  Reflex: 2+  Sensation: reduced pinprick extremities  Coordination:   Gait: normal. Can tandem     Imaging (my summary based on personal review of images).   MRI brain 2020 with periventricular & short seg cord lesions.     Testing (EMG/NCS, EKG, EEG, Echo, Evoked potentials) -  Reviewed in Epic.       Labs. Reviewed in epic. See below  Lab Results   Component Value Date    HGBA1C 5.6 06/13/2019    GLU 80 07/02/2020    EGFR >60.0 07/02/2020     Lab Results   Component Value Date    B12 970 (H) 07/02/2020    VITD 9 (L) 07/02/2020    CREAT 0.6 07/02/2020    WBC 2.96 (L) 07/02/2020        ASSESSMENT / PLAN  RTC: Return in about 1 month (around 07/29/2020) for Theodoro Grist.    1. Multiple sclerosis    2. Shift work sleep disorder    3. Paresthesia    4. 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 PTSD (post-traumatic stress disorder)      44y/o woman with likely MS, long standing brain lesions with T1 black holes, spinal cord lesions. She has a pain specialist (rx'ing hydrocodone) & psychiatrist. In order to confirm dx of MS, it is critical to exclude other autoimmune diseases that can cause inflammatory CNS demyelination. Further, she has been started on Kesimpta, so it is also critical to ensure she is not harboring any serious latent infections.  Kesimpta 20mg  monthly. High risk med. Lab monitoring order  Vitamin D2 50k units weekly  Change sunosi to modafinil 200mg  qAM  Will consider lyrica 150mg  qhs at next visit (not ready to start now)      Orders today (details below)  Orders Placed This Encounter   Procedures    IRL SJOGRENS PANEL    Histone Antibody    Proteinase-3 Antibody    HTLV I/II AB Confirmation, S    Syphilis Screen IgG and IgM    Encephalopathy Autoimmune Evaluation    CNS Demyelinating Disease Evaluation    HSV 1-2 IgM AB, IFA (S)RFLX To Titer    Varicella Zoster Antibody, IgG    Varicella Zoster (VZV) antibody, IgM    Quantiferon(R) - TB Gold Plus    HIV-1/2 Ag/Ab 4th Gen. w/ Reflex    Hepatitis C (HCV) antibody, Total    Hepatitis B (HBV) Surface Antigen w/ Reflex to Confirmation    Hepatitis B (HBV) Surface Antibody Quant    Hepatitis B core antibody, total    Epstein-Barr virus VCA, IgM    Cytomegalovirus antibody, IgM    Cytomegalovirus  (CMV) antibody, IgG, EIA    Cyclic citrul peptide antibody, IgG    Rheumatoid factor    Myeloperoxidase Antibody    Angiotensin converting enzyme    Scleroderma (SCL-70) Antibody    Anti-Jo 1 Antibody IgG    DNA(ds)Antibody Crithidia with Reflex    IgG, IgA, IgM    IgG Subclasses, S    TSH    Vitamin B6    Vitamin B12    Vitamin D,25 OH, Total     Orders Placed This Encounter   Medications    modafinil (PROVIGIL) 100 MG tablet     Sig: Take 1 tablet (100 mg total) by mouth every morning for 7 days, THEN 2 tablets (200 mg total) every morning.     Dispense:  187 tablet     Refill:  1        I spent 74 minutes reviewing the records, talking with the patient and developing their care plan.    PATIENT INSTRUCTIONS PROVIDED  Patient was given an "After Visit Summary" with a copy of the testing orders, medications and the following other instructions:  Patient Instructions   LABS  Any [facility] location. Can go to [facility] on [address] or [employer].  [email or web address]    Options include:  [address], [place], [place] [address]  Phone: [phone]  [address] [place] [address] Phone: [phone]  [address], [place], [place] [address]  -----------------    Lora Havens, MD PHD  Director, Neuroimmunology & MS Center  [facility]    [address], [place], [place] [address]  Main line / after-hours on-call: [phone]      For direct access, contact Gaylyn Cheers RN  Tel [phone]   Fax [phone]           Lora Havens, MD PHD  Director, Neuroimmunology & MS Center  [facility]    [address], [place], [place] [address]  Main line / after-hours on-call: [phone]      For direct access, contact Gaylyn Cheers RN  Tel [phone]   Fax [phone]      REFERRAL / CARE COORDINATION  see communication section, referrals section, EPIC notes  Referring MD:No referring provider defined for this encounter.  Fax:   Primary Care on file: Hurley Cisco, MD  Tel: [phone]   Fax: [phone]  Care Team:Patient Care Team:  Hurley Cisco, MD as PCP - General (Internal Medicine)      IMPORTED INFORMATION FROM MEDICAL RECORDS    ICD-10-CM    1. Multiple sclerosis  G35 modafinil (PROVIGIL) 100 MG tablet     IRL SJOGRENS PANEL     Histone Antibody     Proteinase-3 Antibody     HTLV I/II AB Confirmation, S     Syphilis Screen IgG and IgM     Encephalopathy Autoimmune Evaluation     CNS Demyelinating Disease Evaluation     HSV 1-2 IgM AB, IFA (S)RFLX To Titer     Varicella Zoster Antibody, IgG     Varicella Zoster (VZV) antibody, IgM     Quantiferon(R) - TB Gold Plus     HIV-1/2 Ag/Ab 4th Gen. w/ Reflex     Hepatitis C (HCV) antibody, Total     Hepatitis B (HBV) Surface Antigen w/ Reflex to Confirmation     Hepatitis B (HBV) Surface Antibody Quant     Hepatitis B core antibody, total     Epstein-Barr virus VCA, IgM     Cytomegalovirus antibody, IgM     Cytomegalovirus (CMV) antibody, IgG, EIA     Cyclic citrul peptide antibody, IgG     Rheumatoid factor     Myeloperoxidase Antibody     Angiotensin converting enzyme     Scleroderma (SCL-70) Antibody     Anti-Jo 1 Antibody IgG     DNA(ds)Antibody Crithidia with Reflex     IgG, IgA, IgM     IgG Subclasses, S     TSH     Vitamin B6     Vitamin B12     Vitamin D,25 OH, Total     CANCELED: Comprehensive metabolic panel     CANCELED: CBC and differential     CANCELED: Vitamin D,25 OH, Total     CANCELED: Vitamin B12     CANCELED: Vitamin B6     CANCELED: TSH     CANCELED: IgG Subclasses, S     CANCELED: IgG, IgA, IgM     CANCELED: DNA(ds)Antibody Crithidia with Reflex     CANCELED: Anti-Jo 1 Antibody IgG     CANCELED: Scleroderma (SCL-70) Antibody     CANCELED: Angiotensin converting enzyme     CANCELED: Myeloperoxidase  Antibody     CANCELED: C Reactive Protein     CANCELED: Sedimentation rate (ESR)     CANCELED: Rheumatoid factor     CANCELED: Cyclic citrul peptide antibody, IgG     CANCELED: Cytomegalovirus (CMV) antibody, IgG, EIA     CANCELED: Cytomegalovirus antibody, IgM     CANCELED: Epstein-Barr virus VCA, IgM     CANCELED: Hepatitis B core antibody, total     CANCELED: Hepatitis B (HBV) Surface Antibody Quant     CANCELED: Hepatitis B (HBV) Surface Antigen w/ Reflex to Confirmation     CANCELED: Hepatitis C (HCV) antibody, Total     CANCELED: HIV-1/2 Ag/Ab 4th Gen. w/ Reflex     CANCELED: Quantiferon(R) - TB Gold Plus     CANCELED: Varicella Zoster (VZV) antibody, IgM     CANCELED: Varicella Zoster Antibody, IgG     CANCELED: HSV 1-2 IgM AB, IFA (S)RFLX To Titer     CANCELED: Lymphocytes, Absolute Subset Panel     CANCELED: Lymphocytes, Absolute  Subset Panel     CANCELED: Sedimentation rate (ESR)     CANCELED: C Reactive Protein     CANCELED: CBC and differential     CANCELED: Comprehensive metabolic panel   2. Shift work sleep disorder  G47.26 modafinil (PROVIGIL) 100 MG tablet     IRL SJOGRENS PANEL     Histone Antibody     Proteinase-3 Antibody     HTLV I/II AB Confirmation, S     Syphilis Screen IgG and IgM     Encephalopathy Autoimmune Evaluation     CNS Demyelinating Disease Evaluation     HSV 1-2 IgM AB, IFA (S)RFLX To Titer     Varicella Zoster Antibody, IgG     Varicella Zoster (VZV) antibody, IgM     Quantiferon(R) - TB Gold Plus     HIV-1/2 Ag/Ab 4th Gen. w/ Reflex     Hepatitis C (HCV) antibody, Total     Hepatitis B (HBV) Surface Antigen w/ Reflex to Confirmation     Hepatitis B (HBV) Surface Antibody Quant     Hepatitis B core antibody, total     Epstein-Barr virus VCA, IgM     Cytomegalovirus antibody, IgM     Cytomegalovirus (CMV) antibody, IgG, EIA     Cyclic citrul peptide antibody, IgG     Rheumatoid factor     Myeloperoxidase Antibody     Angiotensin converting enzyme     Scleroderma (SCL-70) Antibody      Anti-Jo 1 Antibody IgG     DNA(ds)Antibody Crithidia with Reflex     IgG, IgA, IgM     IgG Subclasses, S     TSH     Vitamin B6     Vitamin B12     Vitamin D,25 OH, Total     CANCELED: Comprehensive metabolic panel     CANCELED: CBC and differential     CANCELED: Vitamin D,25 OH, Total     CANCELED: Vitamin B12     CANCELED: Vitamin B6     CANCELED: TSH     CANCELED: IgG Subclasses, S     CANCELED: IgG, IgA, IgM     CANCELED: DNA(ds)Antibody Crithidia with Reflex     CANCELED: Anti-Jo 1 Antibody IgG     CANCELED: Scleroderma (SCL-70) Antibody     CANCELED: Angiotensin converting enzyme     CANCELED: Myeloperoxidase Antibody     CANCELED: C Reactive Protein     CANCELED: Sedimentation rate (ESR)     CANCELED: Rheumatoid factor     CANCELED: Cyclic citrul peptide antibody, IgG     CANCELED: Cytomegalovirus (CMV) antibody, IgG, EIA     CANCELED: Cytomegalovirus antibody, IgM     CANCELED: Epstein-Barr virus VCA, IgM     CANCELED: Hepatitis B core antibody, total     CANCELED: Hepatitis B (HBV) Surface Antibody Quant     CANCELED: Hepatitis B (HBV) Surface Antigen w/ Reflex to Confirmation     CANCELED: Hepatitis C (HCV) antibody, Total     CANCELED: HIV-1/2 Ag/Ab 4th Gen. w/ Reflex     CANCELED: Quantiferon(R) - TB Gold Plus     CANCELED: Varicella Zoster (VZV) antibody, IgM     CANCELED: Varicella Zoster Antibody, IgG     CANCELED: HSV 1-2 IgM AB, IFA (S)RFLX To Titer     CANCELED: Lymphocytes, Absolute Subset Panel     CANCELED: Lymphocytes, Absolute Subset Panel     CANCELED: Sedimentation rate (ESR)     CANCELED: C Reactive Protein  CANCELED: CBC and differential     CANCELED: Comprehensive metabolic panel   3. Paresthesia  R20.2 modafinil (PROVIGIL) 100 MG tablet     IRL SJOGRENS PANEL     Histone Antibody     Proteinase-3 Antibody     HTLV I/II AB Confirmation, S     Syphilis Screen IgG and IgM     Encephalopathy Autoimmune Evaluation     CNS Demyelinating Disease Evaluation     HSV 1-2 IgM AB, IFA (S)RFLX  To Titer     Varicella Zoster Antibody, IgG     Varicella Zoster (VZV) antibody, IgM     Quantiferon(R) - TB Gold Plus     HIV-1/2 Ag/Ab 4th Gen. w/ Reflex     Hepatitis C (HCV) antibody, Total     Hepatitis B (HBV) Surface Antigen w/ Reflex to Confirmation     Hepatitis B (HBV) Surface Antibody Quant     Hepatitis B core antibody, total     Epstein-Barr virus VCA, IgM     Cytomegalovirus antibody, IgM     Cytomegalovirus (CMV) antibody, IgG, EIA     Cyclic citrul peptide antibody, IgG     Rheumatoid factor     Myeloperoxidase Antibody     Angiotensin converting enzyme     Scleroderma (SCL-70) Antibody     Anti-Jo 1 Antibody IgG     DNA(ds)Antibody Crithidia with Reflex     IgG, IgA, IgM     IgG Subclasses, S     TSH     Vitamin B6     Vitamin B12     Vitamin D,25 OH, Total     CANCELED: Comprehensive metabolic panel     CANCELED: CBC and differential     CANCELED: Vitamin D,25 OH, Total     CANCELED: Vitamin B12     CANCELED: Vitamin B6     CANCELED: TSH     CANCELED: IgG Subclasses, S     CANCELED: IgG, IgA, IgM     CANCELED: DNA(ds)Antibody Crithidia with Reflex     CANCELED: Anti-Jo 1 Antibody IgG     CANCELED: Scleroderma (SCL-70) Antibody     CANCELED: Angiotensin converting enzyme     CANCELED: Myeloperoxidase Antibody     CANCELED: C Reactive Protein     CANCELED: Sedimentation rate (ESR)     CANCELED: Rheumatoid factor     CANCELED: Cyclic citrul peptide antibody, IgG     CANCELED: Cytomegalovirus (CMV) antibody, IgG, EIA     CANCELED: Cytomegalovirus antibody, IgM     CANCELED: Epstein-Barr virus VCA, IgM     CANCELED: Hepatitis B core antibody, total     CANCELED: Hepatitis B (HBV) Surface Antibody Quant     CANCELED: Hepatitis B (HBV) Surface Antigen w/ Reflex to Confirmation     CANCELED: Hepatitis C (HCV) antibody, Total     CANCELED: HIV-1/2 Ag/Ab 4th Gen. w/ Reflex     CANCELED: Quantiferon(R) - TB Gold Plus     CANCELED: Varicella Zoster (VZV) antibody, IgM     CANCELED: Varicella Zoster Antibody,  IgG     CANCELED: HSV 1-2 IgM AB, IFA (S)RFLX To Titer     CANCELED: Lymphocytes, Absolute Subset Panel     CANCELED: Lymphocytes, Absolute Subset Panel     CANCELED: Sedimentation rate (ESR)     CANCELED: C Reactive Protein     CANCELED: CBC and differential     CANCELED: Comprehensive metabolic panel   4. Muscle spasm  M62.838 modafinil (PROVIGIL) 100  MG tablet     IRL SJOGRENS PANEL     Histone Antibody     Proteinase-3 Antibody     HTLV I/II AB Confirmation, S     Syphilis Screen IgG and IgM     Encephalopathy Autoimmune Evaluation     CNS Demyelinating Disease Evaluation     HSV 1-2 IgM AB, IFA (S)RFLX To Titer     Varicella Zoster Antibody, IgG     Varicella Zoster (VZV) antibody, IgM     Quantiferon(R) - TB Gold Plus     HIV-1/2 Ag/Ab 4th Gen. w/ Reflex     Hepatitis C (HCV) antibody, Total     Hepatitis B (HBV) Surface Antigen w/ Reflex to Confirmation     Hepatitis B (HBV) Surface Antibody Quant     Hepatitis B core antibody, total     Epstein-Barr virus VCA, IgM     Cytomegalovirus antibody, IgM     Cytomegalovirus (CMV) antibody, IgG, EIA     Cyclic citrul peptide antibody, IgG     Rheumatoid factor     Myeloperoxidase Antibody     Angiotensin converting enzyme     Scleroderma (SCL-70) Antibody     Anti-Jo 1 Antibody IgG     DNA(ds)Antibody Crithidia with Reflex     IgG, IgA, IgM     IgG Subclasses, S     TSH     Vitamin B6     Vitamin B12     Vitamin D,25 OH, Total     CANCELED: Comprehensive metabolic panel     CANCELED: CBC and differential     CANCELED: Vitamin D,25 OH, Total     CANCELED: Vitamin B12     CANCELED: Vitamin B6     CANCELED: TSH     CANCELED: IgG Subclasses, S     CANCELED: IgG, IgA, IgM     CANCELED: DNA(ds)Antibody Crithidia with Reflex     CANCELED: Anti-Jo 1 Antibody IgG     CANCELED: Scleroderma (SCL-70) Antibody     CANCELED: Angiotensin converting enzyme     CANCELED: Myeloperoxidase Antibody     CANCELED: C Reactive Protein     CANCELED: Sedimentation rate (ESR)     CANCELED:  Rheumatoid factor     CANCELED: Cyclic citrul peptide antibody, IgG     CANCELED: Cytomegalovirus (CMV) antibody, IgG, EIA     CANCELED: Cytomegalovirus antibody, IgM     CANCELED: Epstein-Barr virus VCA, IgM     CANCELED: Hepatitis B core antibody, total     CANCELED: Hepatitis B (HBV) Surface Antibody Quant     CANCELED: Hepatitis B (HBV) Surface Antigen w/ Reflex to Confirmation     CANCELED: Hepatitis C (HCV) antibody, Total     CANCELED: HIV-1/2 Ag/Ab 4th Gen. 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 w/ Reflex     Hepatitis C (HCV) antibody, Total     Hepatitis B (HBV) Surface Antigen w/ Reflex to Confirmation     Hepatitis B (HBV) Surface Antibody Quant     Hepatitis B core antibody, total     Epstein-Barr virus VCA, IgM     Cytomegalovirus antibody, IgM     Cytomegalovirus (CMV) antibody, IgG, EIA     Cyclic citrul peptide antibody, IgG     Rheumatoid factor     Myeloperoxidase Antibody     Angiotensin converting enzyme     Scleroderma (SCL-70) Antibody     Anti-Jo 1 Antibody IgG     DNA(ds)Antibody Crithidia with Reflex      IgG, IgA, IgM     IgG Subclasses, S     TSH     Vitamin B6     Vitamin B12     Vitamin D,25 OH, Total     CANCELED: Comprehensive metabolic panel     CANCELED: CBC and differential     CANCELED: Vitamin D,25 OH, Total     CANCELED: Vitamin B12     CANCELED: Vitamin B6     CANCELED: TSH     CANCELED: IgG Subclasses, S     CANCELED: IgG, IgA, IgM     CANCELED: DNA(ds)Antibody Crithidia with Reflex     CANCELED: Anti-Jo 1 Antibody IgG     CANCELED: Scleroderma (SCL-70) Antibody     CANCELED: Angiotensin converting enzyme     CANCELED: Myeloperoxidase Antibody     CANCELED: C Reactive Protein     CANCELED: Sedimentation rate (ESR)     CANCELED: Rheumatoid factor     CANCELED: Cyclic citrul peptide antibody, IgG     CANCELED: Cytomegalovirus (CMV) antibody, IgG, EIA     CANCELED: Cytomegalovirus antibody, IgM     CANCELED: Epstein-Barr virus VCA, IgM     CANCELED: Hepatitis B core antibody, total     CANCELED: Hepatitis B (HBV) Surface Antibody Quant     CANCELED: Hepatitis B (HBV) Surface Antigen w/ Reflex to Confirmation     CANCELED: Hepatitis C (HCV) antibody, Total     CANCELED: HIV-1/2 Ag/Ab 4th Gen. w/ Reflex     CANCELED: Quantiferon(R) - TB Gold Plus     CANCELED: Varicella Zoster (VZV) antibody, IgM     CANCELED: Varicella Zoster Antibody, IgG     CANCELED: HSV 1-2 IgM AB, IFA (S)RFLX To Titer     CANCELED: Lymphocytes, Absolute Subset Panel     CANCELED: Lymphocytes, Absolute Subset Panel     CANCELED: Sedimentation rate (ESR)     CANCELED: C Reactive Protein     CANCELED: CBC and differential     CANCELED: Comprehensive metabolic panel   6. PTSD (post-traumatic stress disorder)  F43.10 modafinil (PROVIGIL) 100 MG tablet     IRL SJOGRENS PANEL     Histone Antibody     Proteinase-3 Antibody     HTLV I/II AB Confirmation, S     Syphilis Screen IgG and IgM  Encephalopathy Autoimmune Evaluation     CNS Demyelinating Disease Evaluation     HSV 1-2 IgM AB, IFA (S)RFLX To Titer     Varicella Zoster Antibody, IgG      Varicella Zoster (VZV) antibody, IgM     Quantiferon(R) - TB Gold Plus     HIV-1/2 Ag/Ab 4th Gen. w/ Reflex     Hepatitis C (HCV) antibody, Total     Hepatitis B (HBV) Surface Antigen w/ Reflex to Confirmation     Hepatitis B (HBV) Surface Antibody Quant     Hepatitis B core antibody, total     Epstein-Barr virus VCA, IgM     Cytomegalovirus antibody, IgM     Cytomegalovirus (CMV) antibody, IgG, EIA     Cyclic citrul peptide antibody, IgG     Rheumatoid factor     Myeloperoxidase Antibody     Angiotensin converting enzyme     Scleroderma (SCL-70) Antibody     Anti-Jo 1 Antibody IgG     DNA(ds)Antibody Crithidia with Reflex     IgG, IgA, IgM     IgG Subclasses, S     TSH     Vitamin B6     Vitamin B12     Vitamin D,25 OH, Total     CANCELED: Comprehensive metabolic panel     CANCELED: CBC and differential     CANCELED: Vitamin D,25 OH, Total     CANCELED: Vitamin B12     CANCELED: Vitamin B6     CANCELED: TSH     CANCELED: IgG Subclasses, S     CANCELED: IgG, IgA, IgM     CANCELED: DNA(ds)Antibody Crithidia with Reflex     CANCELED: Anti-Jo 1 Antibody IgG     CANCELED: Scleroderma (SCL-70) Antibody     CANCELED: Angiotensin converting enzyme     CANCELED: Myeloperoxidase Antibody     CANCELED: C Reactive Protein     CANCELED: Sedimentation rate (ESR)     CANCELED: Rheumatoid factor     CANCELED: Cyclic citrul peptide antibody, IgG     CANCELED: Cytomegalovirus (CMV) antibody, IgG, EIA     CANCELED: Cytomegalovirus antibody, IgM     CANCELED: Epstein-Barr virus VCA, IgM     CANCELED: Hepatitis B core antibody, total     CANCELED: Hepatitis B (HBV) Surface Antibody Quant     CANCELED: Hepatitis B (HBV) Surface Antigen w/ Reflex to Confirmation     CANCELED: Hepatitis C (HCV) antibody, Total     CANCELED: HIV-1/2 Ag/Ab 4th Gen. w/ Reflex     CANCELED: Quantiferon(R) - TB Gold Plus     CANCELED: Varicella Zoster (VZV) antibody, IgM     CANCELED: Varicella Zoster Antibody, IgG     CANCELED: HSV 1-2 IgM AB, IFA (S)RFLX  To Titer     CANCELED: Lymphocytes, Absolute Subset Panel     CANCELED: Lymphocytes, Absolute Subset Panel     CANCELED: Sedimentation rate (ESR)     CANCELED: C Reactive Protein     CANCELED: CBC and differential     CANCELED: Comprehensive metabolic panel     Orders Placed This Encounter   Procedures    IRL SJOGRENS PANEL     Standing Status:   Future     Number of Occurrences:   1     Standing Expiration Date:   06/29/2021     Order Specific Question:   Release to patient     Answer:   Immediate    Histone Antibody  Standing Status:   Future     Number of Occurrences:   1     Standing Expiration Date:   06/29/2021     Order Specific Question:   Release to patient     Answer:   Immediate    Proteinase-3 Antibody     Standing Status:   Future     Number of Occurrences:   1     Standing Expiration Date:   06/29/2021     Order Specific Question:   Release to patient     Answer:   Immediate    HTLV I/II AB Confirmation, S     Standing Status:   Future     Number of Occurrences:   1     Standing Expiration Date:   06/29/2021     Order Specific Question:   Release to patient     Answer:   Immediate    Syphilis Screen IgG and IgM     Standing Status:   Future     Number of Occurrences:   1     Standing Expiration Date:   06/29/2021     Order Specific Question:   Release to patient     Answer:   Immediate    Encephalopathy Autoimmune Evaluation     Standing Status:   Future     Number of Occurrences:   1     Standing Expiration Date:   06/29/2021     Order Specific Question:   Release to patient     Answer:   Immediate    CNS Demyelinating Disease Evaluation     Standing Status:   Future     Number of Occurrences:   1     Standing Expiration Date:   06/29/2021     Order Specific Question:   Release to patient     Answer:   Immediate    HSV 1-2 IgM AB, IFA (S)RFLX To Titer     Standing Status:   Future     Number of Occurrences:   1     Standing Expiration Date:   06/29/2021     Order Specific Question:   Release to patient      Answer:   Immediate    Varicella Zoster Antibody, IgG     Standing Status:   Future     Number of Occurrences:   1     Standing Expiration Date:   06/29/2021     Order Specific Question:   Release to patient     Answer:   Immediate    Varicella Zoster (VZV) antibody, IgM     Standing Status:   Future     Number of Occurrences:   1     Standing Expiration Date:   06/29/2021     Order Specific Question:   Release to patient     Answer:   Immediate    Quantiferon(R) - TB Gold Plus     Standing Status:   Future     Number of Occurrences:   1     Standing Expiration Date:   06/29/2021     Order Specific Question:   Release to patient     Answer:   Immediate    HIV-1/2 Ag/Ab 4th Gen. w/ Reflex     Standing Status:   Future     Number of Occurrences:   1     Standing Expiration Date:   06/29/2021     Order Specific Question:  Release to patient     Answer:   Immediate    Hepatitis C (HCV) antibody, Total     Standing Status:   Future     Number of Occurrences:   1     Standing Expiration Date:   06/29/2021     Order Specific Question:   Release to patient     Answer:   Immediate    Hepatitis B (HBV) Surface Antigen w/ Reflex to Confirmation     Standing Status:   Future     Number of Occurrences:   1     Standing Expiration Date:   06/29/2021     Order Specific Question:   Release to patient     Answer:   Immediate    Hepatitis B (HBV) Surface Antibody Quant     Standing Status:   Future     Number of Occurrences:   1     Standing Expiration Date:   06/29/2021     Order Specific Question:   Release to patient     Answer:   Immediate    Hepatitis B core antibody, total     Standing Status:   Future     Number of Occurrences:   1     Standing Expiration Date:   06/29/2021     Order Specific Question:   Release to patient     Answer:   Immediate    Epstein-Barr virus VCA, IgM     Standing Status:   Future     Number of Occurrences:   1     Standing Expiration Date:   06/29/2021     Order Specific Question:   Release to patient      Answer:   Immediate    Cytomegalovirus antibody, IgM     Standing Status:   Future     Number of Occurrences:   1     Standing Expiration Date:   06/29/2021     Order Specific Question:   Release to patient     Answer:   Immediate    Cytomegalovirus (CMV) antibody, IgG, EIA     Standing Status:   Future     Number of Occurrences:   1     Standing Expiration Date:   06/29/2021     Order Specific Question:   Release to patient     Answer:   Immediate    Cyclic citrul peptide antibody, IgG     Standing Status:   Future     Number of Occurrences:   1     Standing Expiration Date:   06/29/2021     Order Specific Question:   Release to patient     Answer:   Immediate    Rheumatoid factor     Standing Status:   Future     Number of Occurrences:   1     Standing Expiration Date:   06/29/2021     Order Specific Question:   Release to patient     Answer:   Immediate    Myeloperoxidase Antibody     Standing Status:   Future     Number of Occurrences:   1     Standing Expiration Date:   06/29/2021     Order Specific Question:   Release to patient     Answer:   Immediate    Angiotensin converting enzyme     Standing Status:   Future     Number of Occurrences:  1     Standing Expiration Date:   06/29/2021     Order Specific Question:   Release to patient     Answer:   Immediate    Scleroderma (SCL-70) Antibody     Standing Status:   Future     Number of Occurrences:   1     Standing Expiration Date:   06/29/2021     Order Specific Question:   Release to patient     Answer:   Immediate    Anti-Jo 1 Antibody IgG     Standing Status:   Future     Number of Occurrences:   1     Standing Expiration Date:   06/29/2021     Order Specific Question:   Release to patient     Answer:   Immediate    DNA(ds)Antibody Crithidia with Reflex     Standing Status:   Future     Number of Occurrences:   1     Standing Expiration Date:   06/29/2021     Order Specific Question:   Release to patient     Answer:   Immediate    IgG, IgA, IgM     Standing Status:    Future     Number of Occurrences:   1     Standing Expiration Date:   06/29/2021     Order Specific Question:   Release to patient     Answer:   Immediate    IgG Subclasses, S     Standing Status:   Future     Number of Occurrences:   1     Standing Expiration Date:   06/29/2021     Order Specific Question:   Release to patient     Answer:   Immediate    TSH     Standing Status:   Future     Number of Occurrences:   1     Standing Expiration Date:   06/29/2021     Order Specific Question:   Release to patient     Answer:   Immediate    Vitamin B6     Standing Status:   Future     Number of Occurrences:   1     Standing Expiration Date:   06/29/2021     Order Specific Question:   Release to patient     Answer:   Immediate    Vitamin B12     Standing Status:   Future     Number of Occurrences:   1     Standing Expiration Date:   06/29/2021     Order Specific Question:   Release to patient     Answer:   Immediate    Vitamin D,25 [place], Total     Standing Status:   Future     Number of Occurrences:   1     Standing Expiration Date:   06/29/2021     Order Specific Question:   Release to patient     Answer:   Immediate

## 2020-06-30 ENCOUNTER — Telehealth (INDEPENDENT_AMBULATORY_CARE_PROVIDER_SITE_OTHER): Payer: Self-pay

## 2020-06-30 NOTE — Telephone Encounter (Signed)
PA, is approved for Modafinil 100mg  tablets 06/30/2020 to 06/30/2021 CF.

## 2020-07-01 ENCOUNTER — Telehealth (INDEPENDENT_AMBULATORY_CARE_PROVIDER_SITE_OTHER): Payer: Self-pay

## 2020-07-01 NOTE — Telephone Encounter (Signed)
Patient called to state she previously took the Provigil and she reports all of the side effects with no benefits. Patient states she had heart palpitations, feeling dizzy, nervousness, and still had fatigue while taking the Provigil. Patient requests change to [facility], states she had better success with that medication in the past. Will route to MD for review and guidance.

## 2020-07-02 ENCOUNTER — Other Ambulatory Visit (FREE_STANDING_LABORATORY_FACILITY): Payer: BC Managed Care – PPO

## 2020-07-02 DIAGNOSIS — Z79899 Other long term (current) drug therapy: Secondary | ICD-10-CM

## 2020-07-02 DIAGNOSIS — D84821 Immunodeficiency due to drugs: Secondary | ICD-10-CM

## 2020-07-02 DIAGNOSIS — M62838 Other muscle spasm: Secondary | ICD-10-CM

## 2020-07-02 DIAGNOSIS — G35 Multiple sclerosis: Secondary | ICD-10-CM

## 2020-07-02 DIAGNOSIS — F4310 Post-traumatic stress disorder, unspecified: Secondary | ICD-10-CM

## 2020-07-02 DIAGNOSIS — R202 Paresthesia of skin: Secondary | ICD-10-CM

## 2020-07-02 DIAGNOSIS — G4726 Circadian rhythm sleep disorder, shift work type: Secondary | ICD-10-CM

## 2020-07-02 LAB — CYTOMEGALOVIRUS ANTIBODY, IGG: CMV AB, IgG: <0.2 U/mL

## 2020-07-02 LAB — HEMOLYSIS INDEX: Hemolysis Index: 0 (ref 0–24)

## 2020-07-02 LAB — SYPHILIS SCREEN IGG AND IGM: Syphilis Screen IgG and IgM: NONREACTIVE

## 2020-07-02 LAB — TSH: TSH: 0.58 u[IU]/mL (ref 0.35–4.94)

## 2020-07-02 LAB — HEPATITIS B CORE ANTIBODY, TOTAL: Hepatitis B Core Total AB: NONREACTIVE

## 2020-07-02 LAB — HIV-1/2 AG/AB 4TH GEN. W/ REFLEX: HIV Ag/Ab, 4th Generation: NONREACTIVE

## 2020-07-02 LAB — HEPATITIS B SURFACE ANTIGEN W/ REFLEX TO CONFIRMATION: Hepatitis B Surface Antigen: NONREACTIVE

## 2020-07-02 LAB — GFR: EGFR: >60

## 2020-07-02 LAB — IGG, IGA, IGM
Immunoglobulin A: 474 mg/dL — ABNORMAL HIGH (ref 65–421)
Immunoglobulin G: 1427 mg/dL (ref 540–1822)
Immunoglobulin M: 37 mg/dL (ref 22–293)

## 2020-07-02 LAB — VITAMIN B12: Vitamin B-12: 970 pg/mL — ABNORMAL HIGH (ref 211–911)

## 2020-07-02 LAB — VARICELLA ZOSTER ANTIBODY, IGG: Varicella, IgG: 763.9

## 2020-07-02 LAB — HEPATITIS B SURFACE ANTIBODY: HEPATITIS B SURFACE ANTIBODY: <3.31

## 2020-07-02 LAB — VITAMIN D,25 OH,TOTAL: Vitamin D, 25 OH, Total: 9 ng/mL — ABNORMAL LOW (ref 30–100)

## 2020-07-02 LAB — RHEUMATOID FACTOR: Rheumatoid Factor: <13 (ref 0.0–30.0)

## 2020-07-02 LAB — EPSTEIN-BARR VIRUS VCA, IGM: EBV VCA Ab, IgM: <10 U/mL

## 2020-07-02 LAB — CYTOMEGALOVIRUS ANTIBODY, IGM: CMV Ab, IgM: <8

## 2020-07-02 LAB — HEPATITIS C ANTIBODY: Hepatitis C, AB: NONREACTIVE

## 2020-07-02 MED ORDER — ARMODAFINIL 50 MG PO TABS
ORAL_TABLET | ORAL | 3 refills | Status: DC
Start: 2020-07-02 — End: 2020-07-08

## 2020-07-02 NOTE — Telephone Encounter (Signed)
Sent!

## 2020-07-05 LAB — ELECTROPHORESIS REVIEW, URINE

## 2020-07-05 LAB — IGG SUBCLASSES, S
Immunoglobulin G1 (IgG 1): 664 mg/dL (ref 341–894)
Immunoglobulin G2 (IgG 2): 584 mg/dL (ref 171–632)
Immunoglobulin G3 (IgG 3 ): 22.2 mg/dL (ref 18.4–106.0)
Immunoglobulin G4 (IgG 4): 27.3 mg/dL (ref 2.4–121.0)
Total IgG: 1530 mg/dL (ref 767–1590)

## 2020-07-05 LAB — MYELOPEROXIDASE ANTIBODY: Myeloperoxidase AB: <1 (ref <1.0)

## 2020-07-05 LAB — DNA(DS)AB CRITHIDIA,W-RFL: DNA Ab (ds) Crithidia, IFA: NEGATIVE

## 2020-07-05 LAB — VARICELLA ZOSTER ANTIBODY, IGM: Varicella Zoster Antibody IgM: NEGATIVE

## 2020-07-05 LAB — ANGIOTENSIN CONVERTING ENZYME: Angiotensin-Converting Enzyme: 45 U/L (ref 16–85)

## 2020-07-05 LAB — SERUM PROTEIN ELECTROPHORESIS REVIEW

## 2020-07-05 LAB — PROTEINASE-3 AB: Proteinase 3 AB: <1 (ref <1.0)

## 2020-07-05 LAB — CYCLIC CITRUL PEPTIDE ANTIBODY, IGG: Cyclic Citrullinated Peptide AB: <15.6 U

## 2020-07-06 ENCOUNTER — Other Ambulatory Visit: Payer: BC Managed Care – PPO

## 2020-07-06 LAB — IRL SJOGRENS PANEL
ALT: 9 U/L (ref 0–55)
AST (SGOT): 15 U/L (ref 5–34)
Absolute NRBC: 0 10*3/uL (ref 0.00–0.00)
Albumin %: 50.5 % (ref 46.6–62.6)
Albumin/Globulin Ratio: 1.1 (ref 0.9–2.2)
Albumin: 3.8 g/dL (ref 3.4–4.8)
Albumin: 4 g/dL (ref 3.5–5.0)
Alkaline Phosphatase: 59 U/L (ref 37–117)
Alpha 1, Urine: 0 %
Alpha 2, Urine: 0 %
Alpha-1 Glob %: 2.1 % (ref 1.7–4.1)
Alpha-1 Globulin: 0.2 g/dL (ref 0.1–0.4)
Alpha-2 Glob %: 13.2 % (ref 8.9–14.9)
Alpha-2 Globulin: 1 g/dL (ref 0.8–1.2)
Anion Gap: 5 (ref 5.0–15.0)
BUN: 6 mg/dL — ABNORMAL LOW (ref 7.0–19.0)
Basophils Absolute Automated: 0.06 10*3/uL (ref 0.00–0.08)
Basophils Automated: 2 %
Beta Glob %: 18.8 % (ref 10.9–18.9)
Beta Globulin: 1.4 g/dL — ABNORMAL HIGH (ref 0.6–1.2)
Beta, Urine: 0 %
Bilirubin, Total: 0.3 mg/dL (ref 0.2–1.2)
C-Reactive Protein: 0.1 mg/dL (ref 0.0–0.8)
CO2: 29 mEq/L (ref 21–29)
Calcium: 8.8 mg/dL (ref 8.5–10.5)
Chloride: 102 mEq/L (ref 100–111)
Creatinine: 0.6 mg/dL (ref 0.4–1.5)
Eosinophils Absolute Automated: 0.05 10*3/uL (ref 0.00–0.44)
Eosinophils Automated: 1.7 %
Gamma Globulin %: 15.4 % (ref 9.8–24.4)
Gamma Globulin: 1.2 g/dL (ref 0.6–1.7)
Globulin: 3.5 g/dL (ref 2.0–3.7)
Glucose: 80 mg/dL (ref 70–100)
Hematocrit: 31.9 % — ABNORMAL LOW (ref 34.7–43.7)
Hgb: 9.6 g/dL — ABNORMAL LOW (ref 11.4–14.8)
Immature Granulocytes Absolute: 0.01 10*3/uL (ref 0.00–0.07)
Immature Granulocytes: 0.3 %
Lymphocytes Absolute Automated: 1.19 10*3/uL (ref 0.42–3.22)
Lymphocytes Automated: 40.2 %
MCH: 26.7 pg (ref 25.1–33.5)
MCHC: 30.1 g/dL — ABNORMAL LOW (ref 31.5–35.8)
MCV: 88.9 fL (ref 78.0–96.0)
MPV: 11 fL (ref 8.9–12.5)
Monocytes Absolute Automated: 0.25 10*3/uL (ref 0.21–0.85)
Monocytes: 8.4 %
Neutrophils Absolute: 1.4 10*3/uL (ref 1.10–6.33)
Neutrophils: 47.4 %
Nucleated RBC: 0 /100 WBC (ref 0.0–0.0)
Platelets: 338 10*3/uL (ref 142–346)
Potassium: 4.3 mEq/L (ref 3.5–5.1)
Protein, Total: 7.5 g/dL (ref 6.0–8.3)
Protein, Total: 7.6 g/dL (ref 6.0–8.3)
Protein, UR: <7 mg/dL (ref 1.0–14.0)
RBC: 3.59 10*6/uL — ABNORMAL LOW (ref 3.90–5.10)
RDW: 15 % (ref 11–15)
Sed Rate: 45 mm/Hr — ABNORMAL HIGH (ref 0–20)
Sjogrens SSA (RO) Antibody Interpretation: NEGATIVE
Sjogrens SSA (Ro) Antibody: 9 U/mL (ref 0–19)
Sjogrens SSB (La) Antibody Interpretation: NEGATIVE
Sjogrens SSB (La) Antibody: 2 U/mL (ref 0–19)
Sodium: 136 mEq/L (ref 136–145)
Urine Albumin%: 100 %
Urine Gamma %: 0 %
Urine Pre Albumin%: 0 %
WBC: 2.96 10*3/uL — ABNORMAL LOW (ref 3.10–9.50)

## 2020-07-06 LAB — ANTI-JO 1 ANTIBODY, IGG
JO-1 Antibody Interpretation: NEGATIVE
Jo-1 Antibody: 3 U/mL (ref 0–19)

## 2020-07-06 LAB — HISTONE ANTIBODY
Histone Antibody Interpretation: NEGATIVE
Histone Antibody: 0.3 U/mL (ref 0.0–0.9)

## 2020-07-06 LAB — QUANTIFERON(R)-TB GOLD PLUS
Mitogen-NIL: 9.4
NIL: 0.04
Quantiferon TB Gold Plus: NEGATIVE
TB1-NIL: <0
TB2-NIL: <0

## 2020-07-06 LAB — SCLERODERMA (SCL-70) ANTIBODY
Scleroderma (Scl-70) Antibody Interpretation: NEGATIVE
Scleroderma (Scl-70) Antibody: 5 U/mL (ref 0–19)

## 2020-07-06 LAB — VITAMIN B6: Vitamin B6: 12 (ref 5–50)

## 2020-07-07 ENCOUNTER — Encounter (INDEPENDENT_AMBULATORY_CARE_PROVIDER_SITE_OTHER): Payer: Self-pay

## 2020-07-07 ENCOUNTER — Telehealth (INDEPENDENT_AMBULATORY_CARE_PROVIDER_SITE_OTHER): Payer: Self-pay | Admitting: Neurology

## 2020-07-07 LAB — HSV 1-2 IGM AB, IFA REFLEX TO TITER
HSV 1 IgM Screen: NEGATIVE
HSV 2 IgM Screen: NEGATIVE

## 2020-07-07 NOTE — Telephone Encounter (Signed)
Pt's insurance will only cover 60 tablets of Armodafinil for a 30 day supply (script is written for 3 tablets a day). Pt to use good rx card or different script?

## 2020-07-08 LAB — HTLV-I/-II AB CONFIRMATION, S: HTLV 1/2 Antibody Confirmation: NEGATIVE

## 2020-07-08 MED ORDER — ARMODAFINIL 150 MG PO TABS
150.0000 mg | ORAL_TABLET | Freq: Every day | ORAL | 1 refills | Status: DC
Start: 2020-07-08 — End: 2021-03-14

## 2020-07-08 MED ORDER — ARMODAFINIL 50 MG PO TABS
ORAL_TABLET | ORAL | 0 refills | Status: DC
Start: 2020-07-08 — End: 2020-07-22

## 2020-07-09 ENCOUNTER — Encounter (INDEPENDENT_AMBULATORY_CARE_PROVIDER_SITE_OTHER): Payer: Self-pay

## 2020-07-13 LAB — CNS DEMYELINATING DISEASE EVALUATION
MOG FACS: NEGATIVE
NMO/AQP4 FACS: NEGATIVE

## 2020-07-15 LAB — ENCEPHALOPATHY AUTOIMMUNE EVALUATION
AGNA-1 Antibody IFA: NEGATIVE
AMPA Receptor Antibody CBA: NEGATIVE
ANNA-1 (HU) Antibody IFA: NEGATIVE
ANNA-2 (RI) Antibody IFA: NEGATIVE
ANNA-3 Antibody IFA: NEGATIVE
Amphiphysin Antibody IFA: NEGATIVE
CASPR2 IgG CBA: NEGATIVE
CRMP-5 IgG IFA: NEGATIVE
DPPX Antibody IFA: NEGATIVE
GABA-B Receptor Antibody CBA: NEGATIVE
GAD65 Antibody Assay: 0.01 nmol/L (ref <=0.02)
GFAP IFA: NEGATIVE
IgLON5 IFA: NEGATIVE
LGI1 IgG CBA: NEGATIVE
NIF IFA: NEGATIVE
NMDA Receptor Antibody CBA: NEGATIVE
PCA-1 (YO) Antibody IFA: NEGATIVE
PCA-2 Antibody IFA: NEGATIVE
PCA-Tr (DNER) Antibody IFA: NEGATIVE
mGLuR1 Antibody IFA: NEGATIVE

## 2020-08-03 ENCOUNTER — Ambulatory Visit (INDEPENDENT_AMBULATORY_CARE_PROVIDER_SITE_OTHER): Payer: BC Managed Care – PPO | Admitting: Neurology

## 2020-08-11 ENCOUNTER — Encounter (INDEPENDENT_AMBULATORY_CARE_PROVIDER_SITE_OTHER): Payer: Self-pay | Admitting: Neurology

## 2020-08-11 ENCOUNTER — Ambulatory Visit (INDEPENDENT_AMBULATORY_CARE_PROVIDER_SITE_OTHER): Payer: BC Managed Care – PPO | Admitting: Neurology

## 2020-08-11 VITALS — BP 137/89 | HR 105 | Ht 66.0 in | Wt 176.0 lb

## 2020-08-11 DIAGNOSIS — R202 Paresthesia of skin: Secondary | ICD-10-CM

## 2020-08-11 DIAGNOSIS — G35 Multiple sclerosis: Secondary | ICD-10-CM

## 2020-08-11 DIAGNOSIS — E559 Vitamin D deficiency, unspecified: Secondary | ICD-10-CM

## 2020-08-11 DIAGNOSIS — Z79899 Other long term (current) drug therapy: Secondary | ICD-10-CM

## 2020-08-11 DIAGNOSIS — D84821 Immunodeficiency due to drugs: Secondary | ICD-10-CM

## 2020-08-11 DIAGNOSIS — F4310 Post-traumatic stress disorder, unspecified: Secondary | ICD-10-CM

## 2020-08-11 MED ORDER — ARMODAFINIL 250 MG PO TABS
250.0000 mg | ORAL_TABLET | Freq: Every morning | ORAL | 5 refills | Status: DC
Start: 2020-08-11 — End: 2021-02-08

## 2020-08-11 NOTE — Progress Notes (Signed)
[place] [facility]  MULTIPLE SCLEROSIS & NEUROIMMUNOLOGY CENTER  Main line / Scheduling / on-call: [phone]  Clinic Tel: [phone]  Fax: [phone]  Send [place] a mychart or inbasket message for the fastest response  ~~~~~~~~~~~~~~~~~~~~~~~~~~~~~~~~~~~~~~~~~~~~~~~~~~~~~~~~~~~~~~~~~~    MULTIPLE SCLEROSIS / NEUROIMMUNOLOGY CONSULT PATIENT  Primary (referring) neurologist: Dr Caleen Essex    IN-PERSON    CC: Multiple Sclerosis (Relapsing form with or without progression)    HPI:  History obtained from patient,     45 y.o. year-old female with diagnosis as per CC above.    Review of records reveals (in summary):      Multiple Sclerosis Specific Symptoms (new or changed from last visit):  Uthoff (symptoms worsened by heat or extreme cold):     Energy:   Cognition: "brain fog"  Psychiatric:     Vision: ok  Brainstem: ok  Sensory / Pain: ok  Bowel / Bladder:     Weakness, Spasms: +charley horses  Ambulation distance, Support, Falls: Marland Kitchen     MS Medications (See A/P section for other medications)  Current Disease Modifying Therapy: Kesimpta (06/2019).   Failed DMTs: copaxone, aubagio ("flushed & hot")    EXAM  Visit Vitals  BP 137/89 (BP Site: Left arm, Patient Position: Sitting, Cuff Size: Small)   Pulse (!) 105   Ht 1.676 m (5\' 6" )   Wt 79.8 kg (176 lb)   BMI 28.41 kg/m     General:   Fundoscopic:   Psychiatric.   Mental Status: The patient was awake, alert, appropriate  Cranial Nerves: CN II-XII   Motor: 5/5, normal tone  Reflex:   Sensation:   Coordination:   Gait: normal     Imaging (my summary based on personal review of images).       Testing (EMG/NCS, EKG, EEG, Echo, Evoked potentials) - Reviewed in Epic.       Labs. Reviewed in epic. See below  Lab Results   Component Value Date    HGBA1C 5.6 06/13/2019    GLU 80 07/02/2020    EGFR >60.0 07/02/2020     Lab Results   Component Value Date    B12 970 (H) 07/02/2020    VITD 9 (L) 07/02/2020    CREAT 0.6 07/02/2020    WBC 2.96 (L) 07/02/2020      Other labs  look ok (see end of note).   Lymphocyte subsets were not done.     ASSESSMENT / PLAN  RTC: Return in about 1 month (around 09/11/2020) for [place].    1. Multiple sclerosis    2. Paresthesia    3. 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 Vitamin D deficiency      45 year old woman who is confirmed to have multiple sclerosis.  Specifically, labs for NMO, MOG, autoimmune encephalitis and rheumatologic disorders came back negative.  Immunoglobulin levels are within range and not low.  Laboratory testing for latent, high risk infections were negative.    Kesimpta 20mg  monthly. High risk med.  Labs reviewed.  Increase armodafinil 250mg  qAM  Agree with lyrica increase to 225mg  - rx'd by Dr Dorene Grebe  Change to Vitamin D3 50k units weekly  D/c baclofen given absence of spasticity     Orders today (details below)  Orders Placed This Encounter   Procedures   . MRI Thoracic Spine W WO Contrast   . Lymphocytes, Absolute Subset Panel     Orders Placed This Encounter  Medications   . Armodafinil (NUVIGIL) 250 MG tablet     Sig: Take 1 tablet (250 mg total) by mouth every morning     Dispense:  30 tablet     Refill:  5        I spent 40 minutes reviewing the records, talking with the patient and developing their care plan.      Lora Havens, MD PHD  Director, Neuroimmunology & MS Center  [facility]    [address], [place], [place] [address]  Main line / after-hours on-call: [phone]      For direct access, contact Gaylyn Cheers RN or Allayne Stack RN  Tel [phone]   Fax [phone]       PATIENT INSTRUCTIONS PROVIDED  Patient was given an "After Visit Summary" with a copy of the testing orders, medications and the following other instructions:  There are no Patient Instructions on file for this visit.    REFERRAL / CARE COORDINATION  see communication section, referrals section, EPIC  notes  Referring MD:No referring provider defined for this encounter.  Fax:   Primary Care on file: Hurley Cisco, MD  Tel: [phone]   Fax: [phone]  Care Team:Patient Care Team:  Hurley Cisco, MD as PCP - General (Internal Medicine)    IMPORTED INFORMATION FROM MEDICAL RECORDS    ICD-10-CM    1. Multiple sclerosis  G35 Lymphocytes, Absolute Subset Panel     MRI Thoracic Spine W WO Contrast   2. Paresthesia  R20.2 Lymphocytes, Absolute Subset Panel     MRI Thoracic Spine W WO Contrast   3. 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 Vitamin D deficiency  E55.9 Lymphocytes, Absolute Subset Panel     MRI Thoracic Spine W WO Contrast     Orders Placed This Encounter   Procedures   . MRI Thoracic Spine W WO Contrast     Standing Status:   Future     Standing Expiration Date:   08/11/2021     Scheduling Instructions:      MRI to be scheduled at [facility]) Radiology ([email or web address] [phone]), or [place] [facility] at [facility] or [place] only. Do not schedule at any other [place] location..     Order Specific Question:   Does the patient have a pacemaker or defibrillator?     Answer:   No     Order Specific Question:   What is the patient's sedation requirement?     Answer:   No Sedation     Order Specific Question:   Is the patient pregnant?     Answer:   No     Order Specific Question:   Clinical info for radiologist     Answer:   MS/demyelination eval - 3T scanner     Order Specific Question:   Release to patient     Answer:   Immediate   . Lymphocytes, Absolute Subset Panel     Standing Status:   Future     Standing Expiration Date:   08/11/2021     Order Specific Question:   Release to patient     Answer:   Immediate      Component  Latest Ref Rng & Units 07/02/2020  07/02/2020 07/02/2020          10:58 AM 10:58 AM 10:58 AM   Sed Rate      0 - 20 mm/Hr  45 (H)    C-Reactive Protein      0.0 - 0.8 mg/dL  0.1    WBC      0.98 - 9.50 x10 3/uL  2.96 (L)    RBC      3.90 - 5.10 x10 6/uL  3.59 (L)    Hemoglobin      11.4 - 14.8 g/dL  9.6 (L)    Hematocrit      34.7 - 43.7 %  31.9 (L)    MCV      78.0 - 96.0 fL  88.9    MCH      25.1 - 33.5 pg  26.7    MCHC      31.5 - 35.8 g/dL  11.9 (L)    RDW      11 - 15 %  15    Platelet Count      142 - 346 x10 3/uL  338    MPV      8.9 - 12.5 fL  11.0    Neutrophils      None %  47.4    Lymphocytes Automated      None %  40.2    Monocytes      None %  8.4    Eosinophils Automated      None %  1.7    Basophils Automated      None %  2.0    Immature Granulocytes      None %  0.3    Nucleated RBC      0.0 - 0.0 /100 WBC  0.0    Neutrophils # POCT      1.10 - 6.33 x10 3/uL  1.40    Lymphocytes Absolute Automated      0.42 - 3.22 x10 3/uL  1.19    Monocytes Absolute Automated      0.21 - 0.85 x10 3/uL  0.25    Eosinophils Absolute Automated      0.00 - 0.44 x10 3/uL  0.05    Basophils Absolute Automated      0.00 - 0.08 x10 3/uL  0.06    Immature Granulocytes Absolute      0.00 - 0.07 x10 3/uL  0.01    Nucleated RBC Absolute      0.00 - 0.00 x10 3/uL  0.00    Glucose      70 - 100 mg/dL  80    BUN      7.0 - 14.7 mg/dL  6.0 (L)    Creatinine      0.4 - 1.5 mg/dL  0.6    Sodium      829 - 145 mEq/L  136    Potassium      3.5 - 5.1 mEq/L  4.3    Chloride      100 - 111 mEq/L  102    CO2      21 - 29 mEq/L  29    Calcium      8.5 - 10.5 mg/dL  8.8    Protein Total      6.0 - 8.3 g/dL  7.5 7.6   Albumin      3.5 - 5.0 g/dL  4.0    AST  5 - 34 U/L  15    ALT      0 - 55 U/L  9    Alkaline Phosphatase      37 - 117 U/L  59    Bilirubin Total      0.2 - 1.2 mg/dL  0.3    Globulin      2.0 - 3.7 g/dL  3.5    Albumin/Globulin Ratio      0.9 - 2.2  1.1    Anion Gap      5.0 - 15.0  5.0    Sjogrens SSA (Ro) Antibody      0 - 19 U/mL  9    Sjogrens SSA (RO)  Antibody Interpretation        Negative    Sjogrens SSB (La) Antibody      0 - 19 U/mL  2    Sjogrens SSB (La) Antibody Interpretation        Negative    Albumin %      46.6 - 62.6 %  50.5    Albumin      3.4 - 4.8 g/dL  3.8    Alpha-1 Glob %      1.7 - 4.1 %  2.1    Alpha-1 Globulin      0.1 - 0.4 g/dL  0.2    Alpha-2 Glob %      8.9 - 14.9 %  13.2    Alpha-2 Globulin      0.8 - 1.2 g/dL  1.0    Beta Glob %      10.9 - 18.9 %  18.8    Beta Globulin      0.6 - 1.2 g/dL  1.4 (H)    Gamma Globulin %      9.8 - 24.4 %  15.4    Gamma Globulin      0.6 - 1.7 g/dL  1.2    Protein, UR      1.0 - 14.0 mg/dL  <8.4    Urine Pre Albumin%      None Estab. %  0.0    Urine Albumin%      None Estab. %  100.0    Alpha 1, Urine      None Estab. %  0.0    Alpha 2, Urine      None Estab. %  0.0    Beta, Urine      None Estab. %  0.0    Urine Gamma %      None Estab. %  0.0    Encephalopathy Interpretation       SEE BELOW     AMPA Receptor Antibody CBA      Negative Negative     Amphiphysin Antibody IFA      <1:240 Negative     AGNA-1 Antibody IFA      <1:240 Negative     ANNA-1 (HU) Antibody IFA      <1:240 Negative     ANNA-2 (RI) Antibody IFA      <1:240 Negative     ANNA-3 Antibody IFA      <1:240 Negative     CASPR2 IgG CBA      Negative Negative     CRMP-5, IgG      <1:240 Negative     DPPX Antibody IFA      Negative Negative     GABA-B Receptor Antibody CBA      Negative Negative  GAD65 Antibody Assay      <=0.02 nmol/L 0.01     GFAP IFA      Negative Negative     IgLON5 IFA      Negative Negative     LGI1 IgG CBA      Negative Negative     mGLuR1 Antibody IFA      Negative Negative     NIF IFA      Negative Negative     NMDA Receptor Antibody CBA      Negative Negative     PCA-1 (YO) Antibody IFA      <1:240 Negative     PCA-2 Antibody IFA      <1:240 Negative     PCA-Tr (DNER) Antibody IFA      <1:240 Negative     Quantiferon TB Gold Plus      NEGATIVE NEGATIVE     NIL       0.04     Mitogen-NIL       9.40     TB1-NIL        <0.00     TB2-NIL       <0.00     Immunoglobulin G      767 - 1,590 mg/dL 1,610     Immunoglobulin G1 (IgG 1)      341 - 894 mg/dL 960     Immunoglobulin G2 (IgG 2)      171 - 632 mg/dL 454     Immunoglobulin G3 (IgG 3 )      18.4 - 106.0 mg/dL 09.8     Immunoglobulin G4 (IgG 4)      2.4 - 121.0 mg/dL 11.9     CNS Demyelinating Disease Interp       SEE BELOW     NMO/AQP4 FACS      Negative Negative     MOG FACS      Negative Negative     Immunoglobulin G      540 - 1,822 mg/dL 1,478     Immunoglobulin A      65 - 421 mg/dL 295 (H)     Immunoglobulin M      22 - 293 mg/dL 37     Histone Antibody      0.0 - 0.9 U/mL 0.3     Histone Antibody Interpretation       Negative     HSV 1 IgM Screen      Negative Negative     HSV 2 IgM Screen      Negative Negative     Scleroderma (Scl-70) Antibody      0 - 19 U/mL 5     Scleroderma (Scl-70) Antibody Interpretation       Negative     Jo-1 Antibody      0 - 19 U/mL 3     JO-1 Antibody Interpretation       Negative     DNA Ab (ds) Crithidia, IFA      Negative Negative     ADDITIONAL TESTING FOR DNA AB(DS)CRITHIDIA       Not indicated     Proteinase 3 AB      <1.0 <1.0     HTLV 1/2 Ab Confirmation      Negative Negative     Syphilis Screen IgG and IgM       Nonreactive     EGFR       >60.0     Hemolysis Index  0 - 24 0     Varicella-Zoster AB, IgG      See Below 763.9     Varicella Zoster Antibody IgM      Negative Negative     HIV Ag/Ab, 4th Generation      Non-Reactive Non-Reactive     Hepatitis C, AB      Non Reactive Non-Reactive     Hepatitis B Surface AG      Non Reactive Non-Reactive     HEPATITIS B SURFACE ANTIBODY       <3.31     Hepatitis B Core Total AB      Non Reactive Nonreactive     EBV VCA Ab, IgM      See Below U/mL <10.0     CMV Ab, IgM      See Below <8.00     CMV AB, IgG      See Below U/mL <0.20     Cyclic Citrullinated Peptide Ab, S      U <15.6     Rheumatoid Factor      0.0 - 30.0 <13.0     Myeloperoxidase AB      <1.0 <1.0     Angiotensin-Converting  Enzyme      16 - 85 U/L 45     TSH      0.35 - 4.94 uIU/mL 0.58     Vitamin B6      5 - 50 12     Vitamin B-12      211 - 911 pg/mL 970 (H)     Vitamin D 25-OH Total      30 - 100 ng/mL 9 (L)     Serum Prot Electrophoresis Revwew       See Note     Urine Electrophoresis Review       See Note

## 2020-08-12 ENCOUNTER — Telehealth (INDEPENDENT_AMBULATORY_CARE_PROVIDER_SITE_OTHER): Payer: Self-pay

## 2020-08-12 ENCOUNTER — Encounter (INDEPENDENT_AMBULATORY_CARE_PROVIDER_SITE_OTHER): Payer: Self-pay | Admitting: Neurology

## 2020-08-12 NOTE — Telephone Encounter (Signed)
PA initiated          Judy Freeman (Key: [id number])    Your information has been submitted to [employer]. To check for an updated outcome later, reopen this PA request from your dashboard.    If [employer] has not responded to your request within 24 hours, contact [employer] at [phone]. If you think there may be a problem with your PA request, use our live chat feature at the bottom right.

## 2020-09-13 ENCOUNTER — Ambulatory Visit: Admission: RE | Admit: 2020-09-13 | Payer: BC Managed Care – PPO | Source: Ambulatory Visit

## 2020-09-16 ENCOUNTER — Ambulatory Visit (INDEPENDENT_AMBULATORY_CARE_PROVIDER_SITE_OTHER): Payer: BC Managed Care – PPO | Admitting: Neurology

## 2020-11-23 ENCOUNTER — Encounter (INDEPENDENT_AMBULATORY_CARE_PROVIDER_SITE_OTHER): Payer: Self-pay

## 2020-11-23 ENCOUNTER — Telehealth (INDEPENDENT_AMBULATORY_CARE_PROVIDER_SITE_OTHER): Payer: Self-pay

## 2020-11-23 DIAGNOSIS — E559 Vitamin D deficiency, unspecified: Secondary | ICD-10-CM

## 2020-11-23 MED ORDER — VITAMIN D (ERGOCALCIFEROL) 1.25 MG (50000 UT) PO CAPS
50000.0000 [IU] | ORAL_CAPSULE | ORAL | 0 refills | Status: DC
Start: 2020-11-23 — End: 2021-03-14

## 2020-11-23 MED ORDER — VITAMIN D3 1.25 MG (50000 UT) PO TABS
50000.0000 [IU] | ORAL_TABLET | ORAL | 0 refills | Status: DC
Start: 2020-11-23 — End: 2021-03-14

## 2020-11-23 NOTE — Telephone Encounter (Addendum)
Voice mail message received. Return call placed to patient.     Patient states that she is supposed to be on Vitamin D but never received a prescription. Chart Reviewed prescription for Vitamin D 51884 iu sent in [place] which patient states she never received.  Most recent Vitamin D level 9. Prescription resent to [facility] [address] in [place].     Reviewed scheduling for MRI,completion of labs, and scheduling of follow up appointment. Pt verbalized understanding. MyChart message sent with follow up information.

## 2020-12-04 ENCOUNTER — Ambulatory Visit
Admission: RE | Admit: 2020-12-04 | Discharge: 2020-12-04 | Disposition: A | Discharge status: Home / Self Care | Payer: BC Managed Care – PPO | Source: Ambulatory Visit | Attending: Neurology | Admitting: Neurology

## 2020-12-04 DIAGNOSIS — R202 Paresthesia of skin: Secondary | ICD-10-CM | POA: Insufficient documentation

## 2020-12-04 DIAGNOSIS — E559 Vitamin D deficiency, unspecified: Secondary | ICD-10-CM | POA: Insufficient documentation

## 2020-12-04 DIAGNOSIS — G35 Multiple sclerosis: Secondary | ICD-10-CM | POA: Insufficient documentation

## 2020-12-04 DIAGNOSIS — D84821 Immunodeficiency due to drugs: Secondary | ICD-10-CM | POA: Insufficient documentation

## 2020-12-04 DIAGNOSIS — F4310 Post-traumatic stress disorder, unspecified: Secondary | ICD-10-CM

## 2020-12-04 DIAGNOSIS — Z79899 Other long term (current) drug therapy: Secondary | ICD-10-CM | POA: Insufficient documentation

## 2020-12-04 MED ORDER — GADOTERATE MEGLUMINE 7.5 MMOL/15ML IV SOLN (CLARISCAN)
0.2000 mL/kg | Freq: Once | INTRAVENOUS | Status: AC | PRN
Start: 2020-12-04 — End: 2020-12-04
  Administered 2020-12-04: 15:00:00 16 mL via INTRAVENOUS
  Filled 2020-12-04: qty 16

## 2020-12-17 ENCOUNTER — Telehealth (INDEPENDENT_AMBULATORY_CARE_PROVIDER_SITE_OTHER): Payer: Self-pay

## 2020-12-17 ENCOUNTER — Other Ambulatory Visit (INDEPENDENT_AMBULATORY_CARE_PROVIDER_SITE_OTHER): Payer: Self-pay

## 2020-12-17 DIAGNOSIS — G35 Multiple sclerosis: Secondary | ICD-10-CM

## 2020-12-17 NOTE — Telephone Encounter (Signed)
Voice mail message received. Patient requesting TSH orders added to her labs.     Discussed with Dr Theodoro Grist. Order entered. Patient called - aware of orders placed.

## 2020-12-30 ENCOUNTER — Other Ambulatory Visit: Payer: BC Managed Care – PPO

## 2021-01-05 LAB — LYMPHOCYTES, ABSOLUTE SUBSET PANEL
% CD19: 0 % — ABNORMAL LOW (ref 6–29)
Absolute CD3+ Cells: 1008 cells/uL (ref 840–3060)
CD16+CD56 (NK cells): 14 % (ref 4–25)
CD19,  Absolute: <20 cells/uL — ABNORMAL LOW (ref 110–660)
CD3 %: 84 % (ref 57–85)
CD4 (Helper Cells): 60 % (ref 30–61)
CD4 Absolute: 742 cells/uL (ref 490–1740)
CD8 %: 21 % (ref 12–42)
CD8 Absolute: 262 cells/uL (ref 180–1170)
Helper / Suppressor Ratio: 2.83 (ref 0.86–5.00)
Lymphocytes Absolute: 1197 cells/uL (ref 850–3900)
NK Cells Absolute (CD56/16): 160 cells/uL (ref 70–760)

## 2021-01-05 LAB — TSH: TSH: 0.87 mIU/L

## 2021-02-06 ENCOUNTER — Other Ambulatory Visit: Payer: Self-pay | Admitting: Neurology

## 2021-02-23 ENCOUNTER — Encounter: Payer: Self-pay | Admitting: Neurology

## 2021-03-14 ENCOUNTER — Ambulatory Visit: Payer: BC Managed Care – PPO | Attending: Neurology | Admitting: Neurology

## 2021-03-14 ENCOUNTER — Encounter: Payer: Self-pay | Admitting: Neurology

## 2021-03-14 VITALS — BP 122/80 | HR 54 | Temp 98.9°F | Resp 12 | Ht 65.0 in | Wt 175.0 lb

## 2021-03-14 DIAGNOSIS — E559 Vitamin D deficiency, unspecified: Secondary | ICD-10-CM | POA: Insufficient documentation

## 2021-03-14 DIAGNOSIS — G35 Multiple sclerosis: Secondary | ICD-10-CM | POA: Insufficient documentation

## 2021-03-14 DIAGNOSIS — G4726 Circadian rhythm sleep disorder, shift work type: Secondary | ICD-10-CM

## 2021-03-14 DIAGNOSIS — Z79899 Other long term (current) drug therapy: Secondary | ICD-10-CM | POA: Insufficient documentation

## 2021-03-14 DIAGNOSIS — D84821 Immunodeficiency due to drugs: Secondary | ICD-10-CM | POA: Insufficient documentation

## 2021-03-14 DIAGNOSIS — R202 Paresthesia of skin: Secondary | ICD-10-CM | POA: Insufficient documentation

## 2021-03-14 DIAGNOSIS — D7289 Other specified disorders of white blood cells: Secondary | ICD-10-CM | POA: Insufficient documentation

## 2021-03-14 MED ORDER — VITAMIN D3 250 MCG (10000 UT) PO CAPS
10000.0000 [IU] | ORAL_CAPSULE | Freq: Every day | ORAL | 3 refills | Status: DC
Start: 2021-03-14 — End: 2021-05-17

## 2021-03-14 MED ORDER — KESIMPTA 20 MG/0.4ML SC SOAJ
20.0000 mg | SUBCUTANEOUS | 11 refills | Status: DC
Start: 2021-03-14 — End: 2022-03-10

## 2021-03-14 NOTE — Progress Notes (Signed)
[facility]  MULTIPLE SCLEROSIS [facility]UROIMMUNOLOGY CENTER  Main line / Scheduling / on-call: [phone]  Clinic Tel: [phone]  Fax: [phone]  Send [place] a mychart or inbasket message for the fastest response  ~~~~~~~~~~~~~~~~~~~~~~~~~~~~~~~~~~~~~~~~~~~~~~~~~~~~~~~~~~~~~~~~~~    IN-PERSON    CC: Multiple Sclerosis (Relapsing form and/or progressive)    HPI:  History obtained from patient,     45 y.o. year-old female with diagnosis as per CC above.    She is off thyroid medications.   Gets headaches 1-2 x a week    Multiple Sclerosis Specific Symptoms (new or changed from last visit):  Uthoff (symptoms worsened by heat or extreme cold):     Energy: significant fatigue  Cognition:   Psychiatric:     Vision:   Brainstem: slurred speech  Sensory / Pain: she has some shooting leg pain in her low back.   Bowel / Bladder:     Weakness, Spasms: significant low back pain. Early AM hand spasms and tightness.   Ambulation distance, Support, Falls: she gets winded walking 300 feet or so.     MS Medications (See A/P section for other medications)  Current Disease Modifying Therapy: kesimpta. Well tolerated.   Failed DMTs:       Review of records reveals (in summary):  PMP review shows rx of pregabalin, diazepam and hydrocodone-apap by a Dr Jeral Pinch in [place].     MS Medications (See A/P section for other medications)  Current Disease Modifying Therapy: Kesimpta (06/2019).   Failed DMTs: copaxone, aubagio ("flushed & hot")    EXAM  Visit Vitals  BP 122/80   Pulse (!) 54   Temp 98.9 F (37.2 C)   Resp 12   Ht 1.651 m (5\' 5" )   Wt 79.4 kg (175 lb)   BMI 29.12 kg/m     General:   Fundoscopic:   Psychiatric.   Mental Status: The patient was awake, alert, appropriate.   Cranial Nerves: CN II-XII normal. No apd.   Motor: 5/5  Reflex: 2+  Sensation:   Coordination: normal  Gait: normal. Romberg negative. Difficulty balancing on tiptoes     Imaging (my summary based on personal review of images).       Testing  (EMG/NCS, EKG, EEG, Echo, Evoked potentials) - Reviewed in Epic.       Labs. Reviewed in epic. See below  Lab Results   Component Value Date    HGBA1C 5.6 06/13/2019    GLU 80 07/02/2020    EGFR >60.0 07/02/2020     Lab Results   Component Value Date    B12 970 (H) 07/02/2020    VITD 9 (L) 07/02/2020    CREAT 0.6 07/02/2020    WBC 2.96 (L) 07/02/2020      Component      Latest Ref Rng & Units 12/30/2020   CD3 %      57 - 85 % 84   Absolute CD3+ Cells      840 - 3,060 cells/uL 1,008   CD4 (Helper Cells)      30 - 61 % 60   CD4 - Absolute Count      490 - 1,740 cells/uL 742   CD8 %      12 - 42 % 21   CD8 Absolute      180 - 1,170 cells/uL 262   Helper / Suppressor Ratio      0.86 - 5.00 2.83   CD16+CD56 (NK cells)  4 - 25 % 14   NK Cells Absolute (CD56/16)      70 - 760 cells/uL 160   % CD19      6 - 29 % 0 (L)   CD19,  Absolute      110 - 660 cells/uL <20 (L)   Lymphocytes Absolute      850 - 3,900 cells/uL 1,197   Lymphocyte Subset Comment          TSH      mIU/L 0.87       ASSESSMENT / PLAN  RTC: Return in about 2 months (around 05/15/2021) for [place].    1. Multiple sclerosis    2. Vitamin D deficiency    3. 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 Lymphocyte depletion      45y/o woman with MS, on Kesimpta. Her neurologic exam is good. She does have some symptoms from MS - as expected. Will check NFL and kesimpta labs to assess MS control..     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 She sees a pain specialist.    She struggles with fatigue which is multifactorial. She is already on a high dose of armodafinil. She is on a number of sedating medications from her pain specialist, and was encouraged to discuss therapy with her pain specialist.     Other contributors include hx of depression which may need to be treated. Further, she says she is no longer getting levothyroxine. Hypothyroidism can cause fatigue, parasthesias and amplification of  MS symptoms.    Finally, vit D levels are very low. This can cause myopathy. Also, higher vit D levels correlate w/ better MS outcomes in MS, leading to a 25-OH vit D goal of 50-100 for MS patients. She forgets to take vitamin D weekly so will change her to a daily pill.    PLAN  Kesimpta 20mg  monthly. High risk med.  Labs reviewed.  armodafinil 250mg  qAM  Change Vitamin D3 10,000 units daily (forgets to take it weekly)  Lyrica and other meds for pain per pain specialist - on opiates    Orders today (details below)  Orders Placed This Encounter   Procedures    Miscellaneous Lab Test    CBC and differential    Comprehensive metabolic panel    Vitamin D,25 OH, Total    Lymphocytes, Absolute Subset Panel    IgG, IgA, IgM       Orders Placed This Encounter   Medications    Kesimpta 20 MG/0.4ML Solution Auto-injector     Sig: Inject 20 mg into the skin every 30 (thirty) days Starting week #4     Dispense:  0.4 mL     Refill:  11    Cholecalciferol (Vitamin D3) 250 MCG (10000 UT) capsule     Sig: Take 10,000 Units by mouth daily     Dispense:  90 capsule     Refill:  3          I spent 40 minutes reviewing the records, talking with the patient and developing their care plan.      Lora Havens, MD PHD  Director, Neuroimmunology & MS Center  [facility]    [address], [place], [place] [address]  Main line / after-hours on-call: [phone]      For direct access, contact  Allayne Stack RN  Tel [phone]   Fax [phone]     ================================================================  CLINICAL JUSTIFICATION FOR NEUROFILAMENT LIGHT  NFL (neurofilament light chain) Insurance Justification  Serum / plasma Neurofilament light chain is a well-established biomarker for MS relapses, and potentially for non-relapsing progressive disease as well (it is sensitive but not necessarily specific). Recently, testing has become commercially  available.  This data has emerged from multiple independent groups -- eg, NFL working groups in [place] & [place]; [place], [place], and [place] reference laboratories; [employer]; and studies of MS cohorts at [employer]; and clinical trials for various MS therapies. NFL can improve upon the sensitivity of MRI (current standard of care) for detecting disease activity/progression. Further, most MS therapies have been shown to decrease sNFL levels, with higher efficacy therapies demonstrating greater reduction than lower efficacy therapies. Overall, when sNFL values are interpreted in context of patient's clinical situation, they are likely to impact management and treatment decisions.     PATIENT INSTRUCTIONS PROVIDED  Patient was given an "After Visit Summary" with a copy of the testing orders, medications and the following other instructions:  There are no Patient Instructions on file for this visit.    REFERRAL / CARE COORDINATION  see communication section, referrals section, EPIC notes  Referring MD:No referring provider defined for this encounter.  Fax:   Primary Care on file: Hurley Cisco, MD  Tel: [phone]   Fax: [phone]  Care Team:Patient Care Team:  Hurley Cisco, MD as PCP - General (Internal Medicine)    IMPORTED INFORMATION FROM MEDICAL RECORDS    ICD-10-CM    1. Multiple sclerosis  G35 Kesimpta 20 MG/0.4ML Solution Auto-injector     Cholecalciferol (Vitamin D3) 250 MCG (10000 UT) capsule     Miscellaneous Lab Test     CBC and differential     Comprehensive metabolic panel     Vitamin D,25 OH, Total     Lymphocytes, Absolute Subset Panel     IgG, IgA, IgM      2. Vitamin D deficiency  E55.9 Kesimpta 20 MG/0.4ML Solution Auto-injector     Cholecalciferol (Vitamin D3) 250 MCG (10000 UT) capsule     Miscellaneous Lab Test     CBC and differential     Comprehensive metabolic panel     Vitamin D,25 OH, Total     Lymphocytes, Absolute Subset Panel     IgG, IgA, IgM      3. 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 Lymphocyte depletion  D72.89 Miscellaneous Lab Test     CBC and differential     Comprehensive metabolic panel     Vitamin D,25 OH, Total  Lymphocytes, Absolute Subset Panel     IgG, IgA, IgM        Orders Placed This Encounter   Procedures    Miscellaneous Lab Test     Stability: Refrigerated 7 days; frozen 90 days     Standing Status:   Future     Standing Expiration Date:   03/14/2022     Order Specific Question:   Enter miscellaneous lab test:     Answer:   [facility] Send Out, Code NFLC, Lavender top (EDTA), 1.5 ml.. Centrifuge and aliquot plasma into plastic vial.    CBC and differential     Standing Status:   Future     Standing Expiration Date:   03/14/2022     Order Specific Question:   Release to patient     Answer:   Immediate    Comprehensive metabolic panel     Standing Status:   Future     Standing Expiration Date:   03/14/2022     Order Specific Question:   Has the patient fasted?     Answer:   No     Order Specific Question:   Release to patient     Answer:   Immediate    Vitamin D,25 OH, Total     Standing Status:   Future     Standing Expiration Date:   03/14/2022     Order Specific Question:   Release to patient     Answer:   Immediate    Lymphocytes, Absolute Subset Panel     Standing Status:   Future     Standing Expiration Date:   03/14/2022     Order Specific Question:   Release to  patient     Answer:   Immediate    IgG, IgA, IgM     Standing Status:   Future     Standing Expiration Date:   03/14/2022     Order Specific Question:   Release to patient     Answer:   Immediate

## 2021-03-23 ENCOUNTER — Emergency Department: Payer: BC Managed Care – PPO

## 2021-03-23 ENCOUNTER — Emergency Department
Admission: EM | Admit: 2021-03-23 | Discharge: 2021-03-24 | Disposition: A | Discharge status: Home / Self Care | Payer: BC Managed Care – PPO | Attending: Emergency Medicine | Admitting: Emergency Medicine

## 2021-03-23 DIAGNOSIS — D508 Other iron deficiency anemias: Secondary | ICD-10-CM | POA: Insufficient documentation

## 2021-03-23 DIAGNOSIS — U071 COVID-19: Secondary | ICD-10-CM | POA: Insufficient documentation

## 2021-03-23 DIAGNOSIS — R079 Chest pain, unspecified: Secondary | ICD-10-CM

## 2021-03-23 HISTORY — DX: Unspecified osteoarthritis, unspecified site: M19.90

## 2021-03-23 LAB — URINALYSIS WITH MICROSCOPIC
Bilirubin, UA: NEGATIVE
Blood, UA: NEGATIVE
Glucose, UA: NEGATIVE
Ketones UA: 20 — AB
Leukocyte Esterase, UA: NEGATIVE
Nitrite, UA: NEGATIVE
Protein, UR: NEGATIVE
Specific Gravity UA: 1.012 (ref 1.001–1.035)
Urine pH: 5 (ref 5.0–8.0)
Urobilinogen, UA: NORMAL mg/dL (ref 0.2–2.0)

## 2021-03-23 LAB — MAN DIFF ONLY
Band Neutrophils Absolute: 0.02 10*3/uL (ref 0.00–1.00)
Band Neutrophils: 1 %
Basophils Absolute Manual: 0 10*3/uL (ref 0.00–0.08)
Basophils Manual: 0 %
Eosinophils Absolute Manual: 0 10*3/uL (ref 0.00–0.44)
Eosinophils Manual: 0 %
Lymphocytes Absolute Manual: 0.51 10*3/uL (ref 0.42–3.22)
Lymphocytes Manual: 31 %
Monocytes Absolute: 0.21 10*3/uL (ref 0.21–0.85)
Monocytes Manual: 13 %
Neutrophils Absolute Manual: 0.9 10*3/uL — ABNORMAL LOW (ref 1.10–6.33)
Segmented Neutrophils: 55 %

## 2021-03-23 LAB — CBC AND DIFFERENTIAL
Absolute NRBC: 0 10*3/uL (ref 0.00–0.00)
Hematocrit: 29.4 % — ABNORMAL LOW (ref 34.7–43.7)
Hgb: 9.1 g/dL — ABNORMAL LOW (ref 11.4–14.8)
MCH: 24.8 pg — ABNORMAL LOW (ref 25.1–33.5)
MCHC: 31 g/dL — ABNORMAL LOW (ref 31.5–35.8)
MCV: 80.1 fL (ref 78.0–96.0)
MPV: 11.1 fL (ref 8.9–12.5)
Nucleated RBC: 0 /100 WBC (ref 0.0–0.0)
Platelets: 282 10*3/uL (ref 142–346)
RBC: 3.67 10*6/uL — ABNORMAL LOW (ref 3.90–5.10)
RDW: 15 % (ref 11–15)
WBC: 1.64 10*3/uL — CL (ref 3.10–9.50)

## 2021-03-23 LAB — COMPREHENSIVE METABOLIC PANEL
ALT: 9 U/L (ref 0–55)
AST (SGOT): 15 U/L (ref 5–41)
Albumin/Globulin Ratio: 1.1 (ref 0.9–2.2)
Albumin: 3.7 g/dL (ref 3.5–5.0)
Alkaline Phosphatase: 49 U/L (ref 37–117)
Anion Gap: 10 (ref 5.0–15.0)
BUN: 7 mg/dL (ref 7.0–21.0)
Bilirubin, Total: 0.3 mg/dL (ref 0.2–1.2)
CO2: 22 mEq/L (ref 17–29)
Calcium: 8.5 mg/dL (ref 8.5–10.5)
Chloride: 106 mEq/L (ref 99–111)
Creatinine: 0.6 mg/dL (ref 0.4–1.0)
Globulin: 3.4 g/dL (ref 2.0–3.6)
Glucose: 79 mg/dL (ref 70–100)
Potassium: 3.7 mEq/L (ref 3.5–5.3)
Protein, Total: 7.1 g/dL (ref 6.0–8.3)
Sodium: 138 mEq/L (ref 135–145)

## 2021-03-23 LAB — GFR: EGFR: >60

## 2021-03-23 LAB — HCG, SERUM, QUALITATIVE: Hcg Qualitative: NEGATIVE

## 2021-03-23 LAB — CELL MORPHOLOGY
Cell Morphology: NORMAL
Platelet Estimate: NORMAL

## 2021-03-23 MED ORDER — ALBUTEROL SULFATE HFA 108 (90 BASE) MCG/ACT IN AERS
2.0000 | INHALATION_SPRAY | RESPIRATORY_TRACT | 0 refills | Status: AC | PRN
Start: 2021-03-23 — End: 2021-04-22

## 2021-03-23 MED ORDER — ONDANSETRON 4 MG PO TBDP
4.0000 mg | ORAL_TABLET | Freq: Four times a day (QID) | ORAL | 0 refills | Status: AC | PRN
Start: 2021-03-23 — End: ?

## 2021-03-23 MED ORDER — ONDANSETRON HCL 4 MG/2ML IJ SOLN
4.0000 mg | Freq: Once | INTRAMUSCULAR | Status: AC
Start: 2021-03-23 — End: 2021-03-23
  Administered 2021-03-23: 22:00:00 4 mg via INTRAVENOUS
  Filled 2021-03-23: qty 2

## 2021-03-23 MED ORDER — SODIUM CHLORIDE 0.9 % IV BOLUS
1000.0000 mL | Freq: Once | INTRAVENOUS | Status: AC
Start: 2021-03-23 — End: 2021-03-23
  Administered 2021-03-23: 20:00:00 1000 mL via INTRAVENOUS

## 2021-03-23 NOTE — ED Provider Notes (Signed)
EMERGENCY DEPARTMENT NOTE     Patient initially seen and examined at   ED PHYSICIAN ASSIGNED       Date/Time Event User Comments    03/23/21 2057 Physician Assigned ABDUL [facility], Laverna Peace, MD assigned as Attending           ED MIDLEVEL (APP) ASSIGNED       Date/Time Event User Comments    03/23/21 2056 PA/NP Provider Assigned ABDUL [facility], [facility] Erline Hau, [place], PA assigned as Physician Assistant            HISTORY OF PRESENT ILLNESS   Historian:Patient  Translator Used: no    Chief Complaint: Flu like symptoms (CoVID+) and Dehydration       Mechanism of Injury:       45 y.o. female female with past medical history of MS, arthritis, asthma, thyroid disease, and bulging lumbar disc presents to ED for evaluation of flulike symptoms.  Reports associated symptoms of myalgia, joint pain, decreased appetite. Patient states she was seen by PCP today and tested positive for COVID-19.  States symptoms started on Monday however gradually worsened.  Reports PCP did not start her on any antiviral medication due to interactions with her current medication.  Denies any chest pain, shortness of breath, abdominal pain, nausea, vomiting, urinary symptoms, and numbness/tingling/weakness in extremities.  Nothing makes better.  Not makes worse.  States patient was concerned and wanted her to get an IV fluids for dehydration.  Patient does not have any other complaints at this time.      MEDICAL HISTORY     Past Medical History:  Past Medical History:   Diagnosis Date    Arthritis     lower back    Asthma     Breast lump 02/22/2017    painful lump left breast    Bulging lumbar disc     Calcific tendinitis     Disorder of thyroid     Per pt, doctor said no longer hypothyroid    MS (multiple sclerosis)     Pneumonia 2011, 2012    Sepsis 2011       Past Surgical History:  Past Surgical History:   Procedure Laterality Date    CESAREAN SECTION      CHOLECYSTECTOMY      DEBRIDEMENT & IRRIGATION, ABSCESS  2015        Social History:  Social History     Socioeconomic History    Marital status: Married   Tobacco Use    Smoking status: Former     Types: Cigarettes    Smokeless tobacco: Never   Vaping Use    Vaping Use: Never used   Substance and Sexual Activity    Alcohol use: Not Currently     Comment: occasionally    Drug use: Yes     Types: Marijuana     Comment: for her MS- medical marijuana, occasional    Sexual activity: Yes     Social Determinants of Health     Financial Resource Strain: High Risk    Difficulty of Paying Living Expenses: Hard   Food Insecurity: Food Insecurity Present    Worried About Running Out of Food in the Last Year: Sometimes true    Ran Out of Food in the Last Year: Sometimes true   Transportation Needs: No Transportation Needs    Lack of Transportation (Medical): No    Lack of Transportation (Non-Medical): No   Physical Activity: Inactive    Days of  Exercise per Week: 0 days    Minutes of Exercise per Session: 0 min   Stress: Stress Concern Present    Feeling of Stress : Rather much   Social Connections: Unknown    Frequency of Communication with Friends and Family: Patient refused    Frequency of Social Gatherings with Friends and Family: Patient refused    Attends Religious Services: Patient refused    Database administrator or Organizations: No    Attends Engineer, structural: Never    Marital Status: Married   Catering manager Violence: Unknown    Fear of Current or Ex-Partner: Patient refused    Emotionally Abused: Patient refused    Physically Abused: Patient refused    Sexually Abused: Patient refused   Housing Stability: High Risk    Unable to Pay for Housing in the Last Year: Yes    Number of Places Lived in the Last Year: 1    Unstable Housing in the Last Year: No       Family History:  Family History   Problem Relation Age of Onset    Cancer Father     Diabetes Maternal Aunt     Cancer Maternal Aunt     Breast cancer Maternal Aunt        Outpatient Medication:  Discharge  Medication List as of 03/24/2021 12:02 AM        CONTINUE these medications which have NOT CHANGED    Details   Cholecalciferol (Vitamin D3) 250 MCG (10000 UT) capsule Take 10,000 Units by mouth daily, Starting Mon 03/14/2021, Normal      HYDROcodone-acetaminophen (NORCO) 5-325 MG per tablet Take 1 tablet by mouth every 6 (six) hours as needed, Historical Med      Kesimpta 20 MG/0.4ML Solution Auto-injector Inject 20 mg into the skin every 30 (thirty) days Starting week #4, Starting Mon 03/14/2021, E-Rx      Levothyroxine Sodium 50 MCG Cap Take 50 mcg by mouth daily   , Historical Med      pregabalin (LYRICA) 75 MG capsule Starting Fri 03/04/2021, Historical Med      Armodafinil (NUVIGIL) 250 MG tablet TAKE 1 TABLET BY MOUTH EVERY MORNING., E-Rx      diazePAM (VALIUM) 2 MG tablet Take 2 mg by mouth every 12 (twelve) hours as needed, Starting Wed 03/16/2021, Historical Med      dicyclomine (BENTYL) 20 MG tablet Take 1 tablet (20 mg total) by mouth every 8 (eight) hours as needed (abdominal pain), Starting Fri 04/02/2020, E-Rx      Solriamfetol HCl (Sunosi) 150 MG Tab Take by mouth daily, Historical Med               REVIEW OF SYSTEMS   Review of Systems   Constitutional:  Positive for appetite change and fatigue. Negative for chills and fever.   Respiratory:  Negative for shortness of breath.    Cardiovascular:  Negative for chest pain.   Gastrointestinal:  Negative for abdominal pain, nausea and vomiting.   Musculoskeletal:  Positive for myalgias.   Neurological:  Negative for syncope, weakness, numbness and headaches.   All other systems reviewed and are negative.         PHYSICAL EXAM     ED Triage Vitals [03/23/21 1935]   Enc Vitals Group      BP 122/78      Heart Rate 75      Resp Rate 18      Temp 97.4 F (36.3  C)      Temp Source Temporal      SpO2 98 %      Weight 79.4 kg      Height 1.676 m      Head Circumference       Peak Flow       Pain Score       Pain Loc       Pain Edu?       Excl. in GC?         Physical Exam  Vitals and nursing note reviewed.   Constitutional:       General: She is not in acute distress.     Appearance: Normal appearance. She is not toxic-appearing.   HENT:      Head: Normocephalic and atraumatic.      Nose: Nose normal.      Mouth/Throat:      Mouth: Mucous membranes are moist.      Pharynx: Oropharynx is clear.   Eyes:      Extraocular Movements: Extraocular movements intact.      Conjunctiva/sclera: Conjunctivae normal.      Pupils: Pupils are equal, round, and reactive to light.   Cardiovascular:      Rate and Rhythm: Normal rate and regular rhythm.   Pulmonary:      Effort: Pulmonary effort is normal. No respiratory distress.      Breath sounds: Normal breath sounds.   Abdominal:      Palpations: Abdomen is soft.      Tenderness: There is no abdominal tenderness.   Musculoskeletal:         General: Normal range of motion.      Cervical back: Normal range of motion and neck supple.   Skin:     General: Skin is warm and dry.      Capillary Refill: Capillary refill takes less than 2 seconds.   Neurological:      General: No focal deficit present.      Mental Status: She is alert and oriented to person, place, and time.      Gait: Gait normal.   Psychiatric:         Mood and Affect: Mood normal.         Behavior: Behavior normal.         MEDICAL DECISION MAKING     DISCUSSION       DDx includes (in specific order), but not limited to: Allergic rhinitis, COVID-19, asthma, COPD, PNA, URI, TB, GERD, influenza     45 y.o. female female with past medical history of MS, arthritis, asthma, thyroid disease, and bulging lumbar disc presents to ED for evaluation of flulike symptoms.  Reports associated symptoms of myalgia, joint pain, decreased appetite. Patient states she was seen by PCP today and tested positive for COVID-19.  States symptoms started on Monday however gradually worsened.  Reports PCP did not start her on any antiviral medication due to interactions with her current  medication.  Focused exam revealed normal exam.  Blood work-up indicated no acute findings.  Patient is aware that she does have a low white blood cells and low hemoglobin levels.  Patient is currently on iron pills.  No acute findings and UA.  Patient is given 1 L of IV fluids and Zofran in the ED.  Patient states after having the IV fluids she is feeling significantly better.  Pain is resolved.  States she is ready to go home.  Patient maintained 98% O2  sat on room air with ambulation. Outpatient management for COVID-19 discussed with the patient.  Prescription for Zofran and albuterol sent to her pharmacy electronically.  Risk and benefits discussed with the patient.  Close follow-up with PCP discussed with the patient.  Referral provided.  Strict ER return precautions given.  Patient confirms/voiced understanding and agrees with plan.  All labs and vital signs from the current visit have been reviewed and any abnormality that is present is not due to severe sepsis or septic shock.    Vital Signs: Reviewed the patient's vital signs.   Nursing Notes: Reviewed and utilized available nursing notes.  Medical Records Reviewed: Reviewed available past medical records.  Counseling: The emergency provider has spoken with the patient and discussed today's findings, in addition to providing specific details for the plan of care.  Questions are answered and there is agreement with the plan.      Rozann Lesches, PA-C, have been the primary provider for Judy Freeman during this Emergency Dept visit. The attending signature signifies review and agreement of the history, physical exam, evaluation, clinical impression and plan except as noted.   I have reviewed the nursing notes, including Past medical and surgical,Family and Social History.     CARDIAC STUDIES    The following cardiac studies were independently interpreted by the Emergency Medicine Physician.  For full cardiac study results please see chart.    EKG  interpreted by me:   EKG Interpretation:    Rhythm:  Normal Sinus  Ectopy:  None  Rate:  Normal  Conduction:  No blocks  ST Segments:  Normal ST segments  T Waves:  Normal T Waves  Axis:  Normal    Q Waves:  None seen  Pacing:  Not applicable  Clinical Impression:  Normal EKG     EMERGENCY IMAGING STUDIES    The following imagine studies were independently interpreted by me (emergency physician):    Radiology:  Interpreted by me (ED Physician)  Study: Chest Xray   Results: No infiltrate. No pneumothorax. No hemothorax. No cardiomegaly. No CHF.  Impression: No acute intrathoracic abnormality.      RADIOLOGY IMAGING STUDIES      XR Chest  AP Portable   Final Result      1. Clear lungs      Jimmye Norman, MD    03/23/2021 10:17 PM            PULSE OXIMETRY    Oxygen Saturation by Pulse Oximetry: 100%  Interventions: none  Interpretation:  normal    EMERGENCY DEPT. MEDICATIONS      ED Medication Orders (From admission, onward)      Start Ordered     Status Ordering Provider    03/23/21 2059 03/23/21 2058  ondansetron (ZOFRAN) injection 4 mg  Once        Route: Intravenous  Ordered Dose: 4 mg     Last MAR action: Given ABDUL [facility]    03/23/21 1938 03/23/21 1937  sodium chloride 0.9 % bolus 1,000 mL  Once        Route: Intravenous  Ordered Dose: 1,000 mL     Last MAR action: Stopped BERNIER, DENNIS J            LABORATORY RESULTS    Ordered and independently interpreted AVAILABLE laboratory tests. Please see results section in chart for full details.  Results for orders placed or performed during the hospital encounter of 03/23/21   CBC and  differential   Result Value Ref Range    WBC 1.64 (LL) 3.10 - 9.50 x10 3/uL    Hgb 9.1 (L) 11.4 - 14.8 g/dL    Hematocrit 43.3 (L) 34.7 - 43.7 %    Platelets 282 142 - 346 x10 3/uL    RBC 3.67 (L) 3.90 - 5.10 x10 6/uL    MCV 80.1 78.0 - 96.0 fL    MCH 24.8 (L) 25.1 - 33.5 pg    MCHC 31.0 (L) 31.5 - 35.8 g/dL    RDW 15 11 - 15 %    MPV 11.1 8.9 - 12.5 fL    Nucleated  RBC 0.0 0.0 - 0.0 /100 WBC    Absolute NRBC 0.00 0.00 - 0.00 x10 3/uL   Comprehensive metabolic panel   Result Value Ref Range    Glucose 79 70 - 100 mg/dL    BUN 7.0 7.0 - 29.5 mg/dL    Creatinine 0.6 0.4 - 1.0 mg/dL    Sodium 188 416 - 606 mEq/L    Potassium 3.7 3.5 - 5.3 mEq/L    Chloride 106 99 - 111 mEq/L    CO2 22 17 - 29 mEq/L    Calcium 8.5 8.5 - 10.5 mg/dL    Protein, Total 7.1 6.0 - 8.3 g/dL    Albumin 3.7 3.5 - 5.0 g/dL    AST (SGOT) 15 5 - 41 U/L    ALT 9 0 - 55 U/L    Alkaline Phosphatase 49 37 - 117 U/L    Bilirubin, Total 0.3 0.2 - 1.2 mg/dL    Globulin 3.4 2.0 - 3.6 g/dL    Albumin/Globulin Ratio 1.1 0.9 - 2.2    Anion Gap 10.0 5.0 - 15.0   Beta HCG, Qual, Serum   Result Value Ref Range    Hcg Qualitative Negative Negative   GFR   Result Value Ref Range    EGFR >60.0    Urinalysis with microscopic   Result Value Ref Range    Urine Type Clean Catch     Color, UA Yellow Colorless - Yellow    Clarity, UA Sl Cloudy (A) Clear - Hazy    Specific Gravity UA 1.012 1.001 - 1.035    Urine pH 5.0 5.0 - 8.0    Leukocyte Esterase, UA Negative Negative    Nitrite, UA Negative Negative    Protein, UR Negative Negative    Glucose, UA Negative Negative    Ketones UA 20 (A) Negative    Urobilinogen, UA Normal 0.2 - 2.0 mg/dL    Bilirubin, UA Negative Negative    Blood, UA Negative Negative    RBC, UA 0-2 0 - 5 /hpf    WBC, UA 0-5 0 - 5 /hpf    Squamous Epithelial Cells, Urine 0-5 0 - 25 /hpf   Manual Differential   Result Value Ref Range    Segmented Neutrophils 55 None %    Band Neutrophils 1 None %    Lymphocytes Manual 31 None %    Monocytes Manual 13 None %    Eosinophils Manual 0 None %    Basophils Manual 0 None %    Neutrophils Absolute Manual 0.90 (L) 1.10 - 6.33 x10 3/uL    Band Neutrophils Absolute 0.02 0.00 - 1.00 x10 3/uL    Lymphocytes Absolute Manual 0.51 0.42 - 3.22 x10 3/uL    Monocytes Absolute 0.21 0.21 - 0.85 x10 3/uL    Eosinophils Absolute Manual 0.00 0.00 - 0.44 x10 3/uL  Basophils Absolute  Manual 0.00 0.00 - 0.08 x10 3/uL   Cell MorpHology   Result Value Ref Range    Cell Morphology Normal     Platelet Estimate Normal    ECG 12 lead   Result Value Ref Range    Ventricular Rate 61 BPM    Atrial Rate 61 BPM    P-R Interval 156 ms    QRS Duration 86 ms    Q-T Interval 404 ms    QTC Calculation (Bezet) 406 ms    P Axis 45 degrees    R Axis 36 degrees    T Axis 42 degrees    IHS MUSE NARRATIVE AND IMPRESSION       NORMAL SINUS RHYTHM  NORMAL ECG  WHEN COMPARED WITH ECG OF 02-Apr-2020 11:06,  NONSPECIFIC T WAVE ABNORMALITY HAS REPLACED INVERTED T WAVES IN INFERIOR LEADS         CRITICAL CARE/PROCEDURES    Procedures    DIAGNOSIS      Diagnosis:  Final diagnoses:   COVID-19   Other iron deficiency anemia       Disposition:  ED Disposition       ED Disposition   Discharge    Condition   --    Date/Time   Wed Mar 23, 2021 11:26 PM    Comment   Wynonna M Bains discharge to home/self care.    Condition at disposition: Stable                 Prescriptions:  Discharge Medication List as of 03/24/2021 12:02 AM        START taking these medications    Details   albuterol sulfate HFA (PROVENTIL) 108 (90 Base) MCG/ACT inhaler Inhale 2 puffs into the lungs every 4 (four) hours as needed for Wheezing or Shortness of Breath (coughing) Dispense with spacer, Starting Wed 03/23/2021, Until Fri 04/22/2021 at 2359, E-Rx      ondansetron (ZOFRAN-ODT) 4 MG disintegrating tablet Take 1 tablet (4 mg) by mouth every 6 (six) hours as needed for Nausea, Starting Wed 03/23/2021, E-Rx           CONTINUE these medications which have NOT CHANGED    Details   Cholecalciferol (Vitamin D3) 250 MCG (10000 UT) capsule Take 10,000 Units by mouth daily, Starting Mon 03/14/2021, Normal      HYDROcodone-acetaminophen (NORCO) 5-325 MG per tablet Take 1 tablet by mouth every 6 (six) hours as needed, Historical Med      Kesimpta 20 MG/0.4ML Solution Auto-injector Inject 20 mg into the skin every 30 (thirty) days Starting week #4, Starting Mon  03/14/2021, E-Rx      Levothyroxine Sodium 50 MCG Cap Take 50 mcg by mouth daily   , Historical Med      pregabalin (LYRICA) 75 MG capsule Starting Fri 03/04/2021, Historical Med      Armodafinil (NUVIGIL) 250 MG tablet TAKE 1 TABLET BY MOUTH EVERY MORNING., E-Rx      diazePAM (VALIUM) 2 MG tablet Take 2 mg by mouth every 12 (twelve) hours as needed, Starting Wed 03/16/2021, Historical Med      dicyclomine (BENTYL) 20 MG tablet Take 1 tablet (20 mg total) by mouth every 8 (eight) hours as needed (abdominal pain), Starting Fri 04/02/2020, E-Rx      Solriamfetol HCl (Sunosi) 150 MG Tab Take by mouth daily, Historical Med             This note was generated by the Epic EMR system/  Dragon speech recognition and may contain inherent errors or omissions not intended by the user. Grammatical errors, random word insertions, deletions and pronoun errors  are occasional consequences of this technology due to software limitations. Not all errors are caught or corrected. If there are questions or concerns about the content of this note or information contained within the body of this dictation they should be addressed directly with the author for clarification.      [address], [place], [place]  03/24/21 [phone]

## 2021-03-23 NOTE — EDIE (Signed)
COLLECTIVE?NOTIFICATION?03/23/2021 19:20?Judy Freeman, Judy Freeman Arnika M?MRN: [id number]    [facility]'s patient encounter information:   MRN:[id number]  Account [id number]  Billing Account [id number]      Criteria Met      Narx Scores Alert    Security and Safety  No Security Events were found.  ED Care Guidelines  There are currently no ED Care Guidelines for this patient. Please check your facility's medical records system.        Prescription Monitoring Program  441??- Narcotic Use Score  502??- Sedative Use Score  400??- Stimulant Use Score  290??- Overdose Risk Score  - All Scores range from 000-999 with 75% of the population scoring < 200 and on 1% scoring above 650  - The last digit of the narcotic, sedative, and stimulant score indicates the number of active prescriptions of that type  - Higher Use scores correlate with increased prescribers, pharmacies, mg equiv, and overlapping prescriptions  - Higher Overdose Risk Scores correlate with increased risk of unintentional overdose death   Concerning or unexpectedly high scores should prompt a review of the PMP record; this does not constitute checking PMP for prescribing purposes.    E.D. Visit Count (12 mo.)  Facility Visits   [facility] 2   Total 2   Note: Visits indicate total known visits.     Recent Emergency Department Visit Summary  Date Facility [facility] Type Diagnoses or Chief Complaint    Mar 23, 2021  [facility]  [place]  Emergency      Covid Positive; has orders for IV fluids      Apr 02, 2020  [facility]  [place]  Emergency      Flu like symptoms      Chest Pain      Viral infection, unspecified      Contact with and (suspected) exposure to covid-19      Diarrhea, unspecified        Recent Inpatient Visit Summary  No Recent Inpatient Visits were found.  Care Team  Provider Specialty Phone Fax Service Dates   Elvina Sidle , MD Family Medicine [phone] Current    Deon Pilling, MD,[facility] Family Medicine   Current      Collective Portal  This patient has registered at the [facility] Emergency Department   For more information visit: [email or web address]     PLEASE NOTE:     1.   Any care recommendations and other clinical information are provided as guidelines or for historical purposes only, and providers should exercise their own clinical judgment when providing care.    2.   You may only use this information for purposes of treatment, payment or health care operations activities, and subject to the limitations of applicable Collective Policies.    3.   You should consult directly with the organization that provided a care guideline or other clinical history with any questions about additional information or accuracy or completeness of information provided.    ? 2022 [place], [employer] - [email or web address]

## 2021-03-23 NOTE — Discharge Instructions (Signed)
Please follow-up with your primary care provider as instructed.  Please return to the emergency department if your symptoms worsen or if you develop new concerning symptoms.      Thank you for allowing us to be a part of your care today!

## 2021-03-24 LAB — ECG 12-LEAD
Atrial Rate: 61 {beats}/min
IHS MUSE NARRATIVE AND IMPRESSION: NORMAL
P Axis: 45 degrees
P-R Interval: 156 ms
Q-T Interval: 404 ms
QRS Duration: 86 ms
QTC Calculation (Bezet): 406 ms
R Axis: 36 degrees
T Axis: 42 degrees
Ventricular Rate: 61 {beats}/min

## 2021-03-24 NOTE — ED Notes (Signed)
Patient ambulated ED hall for 2 min with finger pulse oximetry with spo2 98% and heart rate of 92.  Patient reports tolerating activity.

## 2021-03-24 NOTE — ED Notes (Signed)
PO challenge completed. Patient was able tolerate drinking water 150 mL.

## 2021-04-25 ENCOUNTER — Other Ambulatory Visit (FREE_STANDING_LABORATORY_FACILITY): Payer: BC Managed Care – PPO

## 2021-04-25 DIAGNOSIS — G35 Multiple sclerosis: Secondary | ICD-10-CM

## 2021-04-25 DIAGNOSIS — D84821 Immunodeficiency due to drugs: Secondary | ICD-10-CM

## 2021-04-25 DIAGNOSIS — D7289 Other specified disorders of white blood cells: Secondary | ICD-10-CM

## 2021-04-25 DIAGNOSIS — Z79899 Other long term (current) drug therapy: Secondary | ICD-10-CM

## 2021-04-25 DIAGNOSIS — R202 Paresthesia of skin: Secondary | ICD-10-CM

## 2021-04-25 DIAGNOSIS — E559 Vitamin D deficiency, unspecified: Secondary | ICD-10-CM

## 2021-04-26 LAB — CBC AND DIFFERENTIAL
Absolute NRBC: 0 10*3/uL (ref 0.00–0.00)
Basophils Absolute Automated: 0.04 10*3/uL (ref 0.00–0.08)
Basophils Automated: 1.1 %
Eosinophils Absolute Automated: 0.02 10*3/uL (ref 0.00–0.44)
Eosinophils Automated: 0.6 %
Hematocrit: 30.4 % — ABNORMAL LOW (ref 34.7–43.7)
Hgb: 8.8 g/dL — ABNORMAL LOW (ref 11.4–14.8)
Immature Granulocytes Absolute: 0 10*3/uL (ref 0.00–0.07)
Immature Granulocytes: 0 %
Instrument Absolute Neutrophil Count: 1.93 10*3/uL (ref 1.10–6.33)
Lymphocytes Absolute Automated: 1.26 10*3/uL (ref 0.42–3.22)
Lymphocytes Automated: 35.9 %
MCH: 24 pg — ABNORMAL LOW (ref 25.1–33.5)
MCHC: 28.9 g/dL — ABNORMAL LOW (ref 31.5–35.8)
MCV: 83.1 fL (ref 78.0–96.0)
MPV: 11.7 fL (ref 8.9–12.5)
Monocytes Absolute Automated: 0.26 10*3/uL (ref 0.21–0.85)
Monocytes: 7.4 %
Neutrophils Absolute: 1.93 10*3/uL (ref 1.10–6.33)
Neutrophils: 55 %
Nucleated RBC: 0 /100 WBC (ref 0.0–0.0)
Platelets: 343 10*3/uL (ref 142–346)
RBC: 3.66 10*6/uL — ABNORMAL LOW (ref 3.90–5.10)
RDW: 17 % — ABNORMAL HIGH (ref 11–15)
WBC: 3.51 10*3/uL (ref 3.10–9.50)

## 2021-04-26 LAB — LYMPHOCYTES, ABSOLUTE SUBSET PANEL

## 2021-04-26 LAB — COMPREHENSIVE METABOLIC PANEL
ALT: 9 U/L (ref 0–55)
AST (SGOT): 16 U/L (ref 5–41)
Albumin/Globulin Ratio: 1.1 (ref 0.9–2.2)
Albumin: 3.9 g/dL (ref 3.5–5.0)
Alkaline Phosphatase: 57 U/L (ref 37–117)
Anion Gap: 7 (ref 5.0–15.0)
BUN: 5 mg/dL — ABNORMAL LOW (ref 7.0–21.0)
Bilirubin, Total: 0.4 mg/dL (ref 0.2–1.2)
CO2: 25 mEq/L (ref 17–29)
Calcium: 9.1 mg/dL (ref 8.5–10.5)
Chloride: 105 mEq/L (ref 99–111)
Creatinine: 0.7 mg/dL (ref 0.4–1.0)
Globulin: 3.4 g/dL (ref 2.0–3.6)
Glucose: 115 mg/dL — ABNORMAL HIGH (ref 70–100)
Potassium: 4.6 mEq/L (ref 3.5–5.3)
Protein, Total: 7.3 g/dL (ref 6.0–8.3)
Sodium: 137 mEq/L (ref 135–145)

## 2021-04-26 LAB — IGG, IGA, IGM
Immunoglobulin A: 505 mg/dL — ABNORMAL HIGH (ref 65–421)
Immunoglobulin G: 1376 mg/dL (ref 540–1822)
Immunoglobulin M: 32 mg/dL (ref 22–293)

## 2021-04-26 LAB — GFR: EGFR: >60

## 2021-04-26 LAB — VITAMIN D,25 OH,TOTAL: Vitamin D, 25 OH, Total: 25 ng/mL — ABNORMAL LOW (ref 30–100)

## 2021-04-26 LAB — HEMOLYSIS INDEX: Hemolysis Index: 9 Index (ref 0–24)

## 2021-04-28 LAB — MISCELLANEOUS MAYO TEST

## 2021-05-09 LAB — MISCELLANEOUS MAYO TEST

## 2021-05-17 ENCOUNTER — Encounter: Payer: Self-pay | Admitting: Neurology

## 2021-05-17 ENCOUNTER — Ambulatory Visit: Payer: BC Managed Care – PPO | Attending: Neurology | Admitting: Neurology

## 2021-05-17 VITALS — BP 125/90 | HR 106 | Resp 16 | Ht 66.0 in | Wt 181.6 lb

## 2021-05-17 DIAGNOSIS — M542 Cervicalgia: Secondary | ICD-10-CM | POA: Insufficient documentation

## 2021-05-17 DIAGNOSIS — R519 Headache, unspecified: Secondary | ICD-10-CM | POA: Insufficient documentation

## 2021-05-17 DIAGNOSIS — D84821 Immunodeficiency due to drugs: Secondary | ICD-10-CM | POA: Insufficient documentation

## 2021-05-17 DIAGNOSIS — G8929 Other chronic pain: Secondary | ICD-10-CM | POA: Insufficient documentation

## 2021-05-17 DIAGNOSIS — M549 Dorsalgia, unspecified: Secondary | ICD-10-CM | POA: Insufficient documentation

## 2021-05-17 DIAGNOSIS — Z79899 Other long term (current) drug therapy: Secondary | ICD-10-CM | POA: Insufficient documentation

## 2021-05-17 DIAGNOSIS — E559 Vitamin D deficiency, unspecified: Secondary | ICD-10-CM | POA: Insufficient documentation

## 2021-05-17 DIAGNOSIS — G35 Multiple sclerosis: Secondary | ICD-10-CM | POA: Insufficient documentation

## 2021-05-17 DIAGNOSIS — R202 Paresthesia of skin: Secondary | ICD-10-CM | POA: Insufficient documentation

## 2021-05-17 MED ORDER — OXYBUTYNIN CHLORIDE ER 10 MG PO TB24
10.0000 mg | ORAL_TABLET | Freq: Every day | ORAL | 3 refills | Status: AC
Start: 2021-05-17 — End: ?

## 2021-05-17 MED ORDER — VITAMIN D3 250 MCG (10000 UT) PO CAPS
20000.0000 [IU] | ORAL_CAPSULE | Freq: Every day | ORAL | 3 refills | Status: AC
Start: 2021-05-17 — End: ?

## 2021-05-17 NOTE — Progress Notes (Signed)
[facility]  MULTIPLE SCLEROSIS & NEUROIMMUNOLOGY CENTER  Main line / Scheduling / on-call: [phone]  Clinic Tel: [phone]  Fax: [phone]  Send [id number] a mychart or inbasket message for the fastest response  ~~~~~~~~~~~~~~~~~~~~~~~~~~~~~~~~~~~~~~~~~~~~~~~~~~~~~~~~~~~~~~~~~~    IN-PERSON    CC: Multiple Sclerosis (Relapsing form and/or progressive)    HPI:  History obtained from patient,     46 y.o. year-old female with diagnosis as per CC above.    Significant headaches and pain.    She gets epidural which helps low back pain and relieves norco    Review of records reveals (in summary):  ER 03/23/21- tested pos covid    Multiple Sclerosis Specific Symptoms (new or changed from last visit):  Uthoff (symptoms worsened by heat or extreme cold):     Energy: exertion is worse.   Cognition: some difficulty retaining information. Stuttering.   Psychiatric:     Vision:   Brainstem:   Sensory / Pain: she gets neck parasthesias. Neck pain.   Bowel / Bladder:     Weakness, Spasms:   Ambulation distance, Support, Falls: Marland Kitchen.     MS Medications (See A/P section for other medications)  Current Disease Modifying Therapy: Kesimpta (06/2019).   Failed DMTs: copaxone, aubagio ("flushed & hot")    EXAM  Visit Vitals  BP 125/90 (BP Site: Left arm, Patient Position: Sitting, Cuff Size: Large)   Pulse (!) 106   Resp 16   Ht 1.676 m (5\' 6" )   Wt 82.4 kg (181 lb 9.6 oz)   LMP 05/11/2021 (Exact Date)   BMI 29.31 kg/m     General: wears sunglasses  Fundoscopic:   Psychiatric.   Mental Status: The patient was awake, alert, appropriate  Cranial Nerves: CN II-XII wnl  Motor: 5/5  Reflex:   Sensation:   Coordination:   Gait: normal. Romberg negative     Imaging (my summary based on personal review of images).   Last MRIs on Jan 2022.     Testing (EMG/NCS, EKG, EEG, Echo, Evoked potentials) - Reviewed in Epic.       Labs. Reviewed in epic. See below  Lab Results   Component Value Date    HGBA1C 5.6 06/13/2019    GLU 115 (H)  04/25/2021    EGFR >60.0 04/25/2021     Lab Results   Component Value Date    B12 970 (H) 07/02/2020    VITD 25 (L) 04/25/2021    CREAT 0.7 04/25/2021    WBC 3.51 04/25/2021      NFL 6.2  CD19=0    ASSESSMENT / PLAN  RTC: Return in about 1 month (around 06/14/2021) [place].    1. Multiple sclerosis    2. Vitamin D deficiency    3. 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 Chronic neck and back pain    6. Chronic intractable headache, unspecified headache type      Kesimpta 20mg  monthly. High risk med.  Labs reviewed.  armodafinil 250mg  qAM  Oxybutinin 10mg  daily  Increase Vitamin D3 20,000 units daily   Lyrica and other meds for pain per pain specialist - on opiates  MRI brain & C spine (last MRI b/c Jan 2022)    Orders today (details below)  Orders Placed This Encounter   Procedures    MRI Brain W WO Contrast    MRI Cervical Spine W WO Contrast    Neurology Referral: De HollingsheadLaura Miller, MD [facility]([place])  Orders Placed This Encounter   Medications    Cholecalciferol (Vitamin D3) 250 MCG (10000 UT) capsule     Sig: Take 20,000 Units by mouth daily     Dispense:  90 capsule     Refill:  3    oxybutynin (DITROPAN-XL) 10 MG 24 hr tablet     Sig: Take 1 tablet (10 mg) by mouth daily     Dispense:  90 tablet     Refill:  3        I spent 40 minutes reviewing the records, talking with the patient and developing their care plan.      Lora Havens, MD PHD  Director, Neuroimmunology & MS Center  [facility]    [address], [place], [place] [address]  Main line / after-hours on-call: [phone]      For direct access, contact Allayne Stack RN  Tel [phone]   Fax [phone]     ================================================================  CLINICAL JUSTIFICATION FOR NEUROFILAMENT LIGHT  NFL (neurofilament light chain) Insurance Justification  Serum / plasma Neurofilament light chain is a well-established biomarker for MS  relapses, and potentially for non-relapsing progressive disease as well (it is sensitive but not necessarily specific). Recently, testing has become commercially available.  This data has emerged from multiple independent groups -- eg, NFL working groups in [place] & [place]; [place], [place], and [place] reference laboratories; [employer]; and studies of MS cohorts at [employer]; and clinical trials for various MS therapies. NFL can improve upon the sensitivity of MRI (current standard of care) for detecting disease activity/progression. Further, most MS therapies have been shown to decrease sNFL levels, with higher efficacy therapies demonstrating greater reduction than lower efficacy therapies. Overall, when sNFL values are interpreted in context of patient's clinical situation, they are likely to impact management and treatment decisions.     PATIENT INSTRUCTIONS PROVIDED  Patient was given an "After Visit Summary" with a copy of the testing orders, medications and the following other instructions:  There are no Patient Instructions on file for this visit.    REFERRAL / CARE COORDINATION  see communication section, referrals section, EPIC notes  Referring MD:No referring provider defined for this encounter.  Fax:   Primary Care on file: Hurley Cisco, MD  Tel: [phone]   Fax: [phone]  Care Team:Patient Care Team:  Hurley Cisco, MD as PCP - General (Internal Medicine)    IMPORTED INFORMATION FROM MEDICAL RECORDS    ICD-10-CM    1. Multiple sclerosis  G35 Cholecalciferol (Vitamin D3) 250 MCG (10000 UT) capsule     MRI Brain W WO Contrast     MRI Cervical Spine W WO Contrast      2. Vitamin D deficiency  E55.9 Cholecalciferol (Vitamin D3) 250 MCG (10000 UT) capsule     MRI Brain W WO Contrast     MRI Cervical Spine W WO Contrast      3. Paresthesia  R20.2 Cholecalciferol (Vitamin D3) 250 MCG (10000 UT) capsule     MRI Brain W WO Contrast     MRI Cervical Spine W WO Contrast      4.  Immunosuppression due to drug therapy  D84.821 Cholecalciferol (Vitamin D3) 250 MCG (10000 UT) capsule    Z79.899 MRI Brain W WO Contrast     MRI Cervical Spine W WO Contrast      5. Chronic neck and back pain  M54.2 MRI Brain  W WO Contrast    M54.9 MRI Cervical Spine W WO Contrast    G89.29       6. Chronic intractable headache, unspecified headache type  R51.9 Neurology Referral: De Hollingshead, MD [facility])    G89.29         Orders Placed This Encounter   Procedures    MRI Brain W WO Contrast     Standing Status:   Future     Standing Expiration Date:   05/17/2022     Scheduling Instructions:      [facility]) Radiology only ([email or web address] [phone])     Order Specific Question:   Does the patient have a pacemaker or defibrillator?     Answer:   No     Order Specific Question:   What is the patient's sedation requirement?     Answer:   No Sedation     Order Specific Question:   Is the patient pregnant?     Answer:   No     Order Specific Question:   Clinical info for radiologist     Answer:   MS eval. compare to [place], [facility], [facility].     Order Specific Question:   Release to patient     Answer:   Immediate    MRI Cervical Spine W WO Contrast     Standing Status:   Future     Standing Expiration Date:   05/17/2022     Scheduling Instructions:      MRI to be scheduled at [facility]) Radiology ([email or web address] [phone]), or [place] [facility] center at [facility] or [place] only. Do not schedule at any other [place] location..     Order Specific Question:   Does the patient have a pacemaker or defibrillator?     Answer:   No     Order Specific Question:   What is the patient's sedation requirement?     Answer:   No Sedation     Order Specific Question:   Is the patient pregnant?     Answer:   No     Order Specific Question:   Clinical info for radiologist     Answer:   MS eval. compare to [place], [employer], [facility].     Order Specific Question:   Release to patient     Answer:    Immediate    Neurology Referral: De Hollingshead, MD [facility])     Referral Priority:   Routine     Referral Type:   Consultation     Referral Reason:   Specialty Services Required     Referred to Provider:   Verlene Mayer, MD     Requested Specialty:   Neurology     Number of Visits Requested:   1

## 2021-05-31 ENCOUNTER — Telehealth: Payer: Self-pay

## 2021-05-31 NOTE — Telephone Encounter (Signed)
Return phone call placed in response to several voice mail messages received.     Unable to leave a voice mail message- box not set up  MyChart message sent

## 2021-06-22 ENCOUNTER — Other Ambulatory Visit: Payer: Self-pay | Admitting: Neurology

## 2021-12-29 ENCOUNTER — Other Ambulatory Visit: Payer: Self-pay | Admitting: Neurology

## 2022-01-04 ENCOUNTER — Other Ambulatory Visit: Payer: Self-pay

## 2022-01-04 DIAGNOSIS — G35 Multiple sclerosis: Secondary | ICD-10-CM

## 2022-01-04 MED ORDER — ARMODAFINIL 250 MG PO TABS
250.0000 mg | ORAL_TABLET | Freq: Every morning | ORAL | 0 refills | Status: AC
Start: 2022-01-04 — End: ?

## 2022-01-04 NOTE — Telephone Encounter (Signed)
05/17/2021  Follow up not yet scheduled  MyChart sent    30 day supply pended for signature per your discretion

## 2022-03-10 ENCOUNTER — Other Ambulatory Visit: Payer: Self-pay | Admitting: Neurology

## 2022-03-10 DIAGNOSIS — D84821 Immunodeficiency due to drugs: Secondary | ICD-10-CM

## 2022-03-10 DIAGNOSIS — G35 Multiple sclerosis: Secondary | ICD-10-CM

## 2022-03-10 DIAGNOSIS — E559 Vitamin D deficiency, unspecified: Secondary | ICD-10-CM

## 2022-03-10 DIAGNOSIS — R202 Paresthesia of skin: Secondary | ICD-10-CM

## 2022-03-13 ENCOUNTER — Telehealth: Payer: Self-pay

## 2022-03-13 NOTE — Telephone Encounter (Signed)
Medication: Kesimpta    status: PA submitted     Key: [id number]

## 2022-06-23 ENCOUNTER — Other Ambulatory Visit (INDEPENDENT_AMBULATORY_CARE_PROVIDER_SITE_OTHER): Payer: Self-pay | Admitting: Critical Care Medicine

## 2022-10-01 ENCOUNTER — Emergency Department: Payer: 59

## 2022-10-01 ENCOUNTER — Inpatient Hospital Stay
Admission: EM | Admit: 2022-10-01 | Discharge: 2022-10-09 | DRG: 392 | Disposition: A | Discharge status: Home / Self Care | Payer: 59 | Attending: Internal Medicine | Admitting: Internal Medicine

## 2022-10-01 DIAGNOSIS — Z87891 Personal history of nicotine dependence: Secondary | ICD-10-CM

## 2022-10-01 DIAGNOSIS — E039 Hypothyroidism, unspecified: Secondary | ICD-10-CM | POA: Diagnosis present

## 2022-10-01 DIAGNOSIS — D649 Anemia, unspecified: Secondary | ICD-10-CM | POA: Diagnosis present

## 2022-10-01 DIAGNOSIS — G43909 Migraine, unspecified, not intractable, without status migrainosus: Secondary | ICD-10-CM | POA: Diagnosis present

## 2022-10-01 DIAGNOSIS — G894 Chronic pain syndrome: Secondary | ICD-10-CM | POA: Diagnosis present

## 2022-10-01 DIAGNOSIS — E876 Hypokalemia: Secondary | ICD-10-CM | POA: Diagnosis not present

## 2022-10-01 DIAGNOSIS — K529 Noninfective gastroenteritis and colitis, unspecified: Principal | ICD-10-CM | POA: Diagnosis present

## 2022-10-01 DIAGNOSIS — G35 Multiple sclerosis: Secondary | ICD-10-CM | POA: Diagnosis present

## 2022-10-01 DIAGNOSIS — J069 Acute upper respiratory infection, unspecified: Secondary | ICD-10-CM | POA: Diagnosis present

## 2022-10-01 DIAGNOSIS — Z6828 Body mass index (BMI) 28.0-28.9, adult: Secondary | ICD-10-CM

## 2022-10-01 DIAGNOSIS — E860 Dehydration: Secondary | ICD-10-CM | POA: Diagnosis present

## 2022-10-01 DIAGNOSIS — E663 Overweight: Secondary | ICD-10-CM | POA: Diagnosis present

## 2022-10-01 DIAGNOSIS — K922 Gastrointestinal hemorrhage, unspecified: Secondary | ICD-10-CM

## 2022-10-01 DIAGNOSIS — R7989 Other specified abnormal findings of blood chemistry: Secondary | ICD-10-CM

## 2022-10-01 DIAGNOSIS — Z79890 Hormone replacement therapy (postmenopausal): Secondary | ICD-10-CM

## 2022-10-01 DIAGNOSIS — K648 Other hemorrhoids: Secondary | ICD-10-CM | POA: Diagnosis present

## 2022-10-01 DIAGNOSIS — D8481 Immunodeficiency due to conditions classified elsewhere: Secondary | ICD-10-CM | POA: Diagnosis present

## 2022-10-01 LAB — PT/INR
INR: 1 (ref 0.9–1.1)
PT: 11.3 s (ref 10.1–12.9)

## 2022-10-01 LAB — LACTIC ACID: Whole Blood Lactic Acid: 3.1 mmol/L — ABNORMAL HIGH (ref 0.2–2.0)

## 2022-10-01 LAB — SERUM HCG, QUALITATIVE: hCG Qualitative: NEGATIVE

## 2022-10-01 LAB — COMPREHENSIVE METABOLIC PANEL
ALT: 12 U/L (ref 0–55)
AST (SGOT): 21 U/L (ref 5–41)
Albumin/Globulin Ratio: 1 (ref 0.9–2.2)
Albumin: 4.4 g/dL (ref 3.5–5.0)
Alkaline Phosphatase: 63 U/L (ref 37–117)
Anion Gap: 14 (ref 5.0–15.0)
BUN: 6 mg/dL — ABNORMAL LOW (ref 7–21)
Bilirubin, Total: 0.7 mg/dL (ref 0.2–1.2)
CO2: 18 mEq/L (ref 17–29)
Calcium: 9.8 mg/dL (ref 8.5–10.5)
Chloride: 104 mEq/L (ref 99–111)
Creatinine: 0.7 mg/dL (ref 0.4–1.0)
GFR: >60 mL/min/{1.73_m2} (ref >=60.0)
Globulin: 4.4 g/dL — ABNORMAL HIGH (ref 2.0–3.6)
Glucose: 131 mg/dL — ABNORMAL HIGH (ref 70–100)
Potassium: 4 mEq/L (ref 3.5–5.3)
Protein, Total: 8.8 g/dL — ABNORMAL HIGH (ref 6.0–8.3)
Sodium: 136 mEq/L (ref 135–145)

## 2022-10-01 LAB — LAB USE ONLY - CBC WITH DIFFERENTIAL
Absolute Basophils: 0.04 10*3/uL (ref 0.00–0.08)
Absolute Eosinophils: 0.09 10*3/uL (ref 0.00–0.44)
Absolute Immature Granulocytes: 0.01 10*3/uL (ref 0.00–0.07)
Absolute Lymphocytes: 0.72 10*3/uL (ref 0.42–3.22)
Absolute Monocytes: 0.23 10*3/uL (ref 0.21–0.85)
Absolute Neutrophils: 2.69 10*3/uL (ref 1.10–6.33)
Absolute nRBC: 0 10*3/uL (ref <=0.00)
Basophils %: 1.1 %
Eosinophils %: 2.4 %
Hematocrit: 34 % — ABNORMAL LOW (ref 34.7–43.7)
Hemoglobin: 11.3 g/dL — ABNORMAL LOW (ref 11.4–14.8)
Immature Granulocytes %: 0.3 %
Lymphocytes %: 19 %
MCH: 27 pg (ref 25.1–33.5)
MCHC: 33.2 g/dL (ref 31.5–35.8)
MCV: 81.3 fL (ref 78.0–96.0)
MPV: 10.9 fL (ref 8.9–12.5)
Monocytes %: 6.1 %
Neutrophils %: 71.1 %
Platelet Count: 299 10*3/uL (ref 142–346)
Preliminary Absolute Neutrophil Count: 2.69 10*3/uL (ref 1.10–6.33)
RBC: 4.18 10*6/uL (ref 3.90–5.10)
RDW: 14 % (ref 11–15)
WBC: 3.78 10*3/uL (ref 3.10–9.50)
nRBC %: 0 /100 WBC (ref <=0.0)

## 2022-10-01 LAB — MAGNESIUM: Magnesium: 1.8 mg/dL (ref 1.6–2.6)

## 2022-10-01 LAB — LIPASE: Lipase: 19 U/L (ref 8–78)

## 2022-10-01 MED ORDER — ONDANSETRON HCL 4 MG/2ML IJ SOLN
INTRAMUSCULAR | Status: AC
Start: 2022-10-01 — End: 2022-10-02
  Filled 2022-10-01: qty 2

## 2022-10-01 MED ORDER — SODIUM CHLORIDE 0.9 % IV BOLUS
1000.0000 mL | Freq: Once | INTRAVENOUS | Status: AC
Start: 2022-10-01 — End: 2022-10-02
  Administered 2022-10-01: 1000 mL via INTRAVENOUS

## 2022-10-01 MED ORDER — SODIUM CHLORIDE 0.9 % IV BOLUS
1538.0000 mL | Freq: Once | INTRAVENOUS | Status: AC
Start: 2022-10-01 — End: 2022-10-02
  Administered 2022-10-02: 1538 mL via INTRAVENOUS

## 2022-10-01 MED ORDER — IOHEXOL 350 MG/ML IV SOLN
100.0000 mL | Freq: Once | INTRAVENOUS | Status: AC | PRN
Start: 2022-10-01 — End: 2022-10-01
  Administered 2022-10-01: 100 mL via INTRAVENOUS

## 2022-10-01 MED ORDER — STERILE WATER FOR INJECTION IJ/IV SOLN (WRAP)
2.0000 g | Freq: Once | Status: AC
Start: 2022-10-01 — End: 2022-10-02
  Administered 2022-10-02: 2 g via INTRAVENOUS
  Filled 2022-10-01: qty 2

## 2022-10-01 MED ORDER — VANCOMYCIN HCL IN NACL 1.75-0.9 GM/500ML-% IV SOLN
20.0000 mg/kg | Freq: Once | INTRAVENOUS | Status: AC
Start: 2022-10-01 — End: 2022-10-02
  Administered 2022-10-02: 1750 mg via INTRAVENOUS
  Filled 2022-10-01: qty 500

## 2022-10-01 NOTE — ED Provider Notes (Signed)
Chief Complaint   Patient presents with    Abdominal Pain    Emesis         History Provided by: patient and wife    Additional History: Judy Freeman is a 47 y.o. female with a history of  has a past medical history of Arthritis, Asthma, Breast lump (02/22/2017), Bulging lumbar disc, Calcific tendinitis, Disorder of thyroid, MS (multiple sclerosis), Pneumonia (2011, 2012), and Sepsis (2011)., presenting to the ED with the complaint of nausea/vomiting/diarrhea along with diffuse abdominal pain starting this afternoon.  Abdominal pain described as diffuse, crampy abdominal pain that comes in waves.  Symptoms have persisted since.  No blood in stool or vomit.  Unable to tolerate p.o.  Patient also states that she feels dehydrated and weak/fatigued.  No known ingestion of contaminated food or drink.  No sick contacts.  No urogenital complaint.Marland Kitchen    PCP: Hurley Cisco, MD    Specialists:           REVIEW OF SYMPTOMS     Review of Systems   Constitutional:  Negative for chills, diaphoresis and fever.   Respiratory:  Negative for shortness of breath.    Cardiovascular:  Negative for chest pain.   Gastrointestinal:  Positive for abdominal pain, diarrhea, nausea and vomiting. Negative for blood in stool and melena.   Genitourinary:  Negative for dysuria, frequency, hematuria and urgency.   Skin:  Negative for rash.   Neurological:  Negative for dizziness and loss of consciousness.             PAST HISTORY     Medical/Surgical History:   Past Medical History:   Diagnosis Date    Arthritis     lower back    Asthma     Breast lump 02/22/2017    painful lump left breast    Bulging lumbar disc     Calcific tendinitis     Disorder of thyroid     Per pt, doctor said no longer hypothyroid    MS (multiple sclerosis)     Pneumonia 2011, 2012    Sepsis 2011     Past Surgical History:   Procedure Laterality Date    CESAREAN SECTION      CHOLECYSTECTOMY      DEBRIDEMENT & IRRIGATION, ABSCESS  2015       Social History:   Social  History     Tobacco Use    Smoking status: Former     Types: Cigarettes    Smokeless tobacco: Never   Vaping Use    Vaping status: Never Used   Substance Use Topics    Alcohol use: Not Currently     Comment: occasionally    Drug use: Yes     Types: Marijuana     Comment: for her MS- medical marijuana, occasional        Family History:   Family History   Problem Relation Age of Onset    Cancer Father     Diabetes Maternal Aunt     Cancer Maternal Aunt     Breast cancer Maternal Aunt        Medications:   The patient's home medications have been reviewed.    Allergies:   Penicillins             PHYSICAL EXAM     Temp: 97.1 F (36.2 C)  Heart Rate: 94  Resp Rate: 22  BP: 131/81  SpO2: 100 %  Pain Score: 7-severe pain  Physical Exam  Vitals and nursing note reviewed.   Constitutional:       General: She is not in acute distress.     Appearance: She is well-developed. She is not ill-appearing, toxic-appearing or diaphoretic.      Comments: Uncomfortable appearing, moaning.  Patient states that she is doing so because she feels dehydrated and weak   HENT:      Head: Normocephalic and atraumatic.      Mouth/Throat:      Mouth: Mucous membranes are dry.      Pharynx: Oropharynx is clear.   Eyes:      Conjunctiva/sclera: Conjunctivae normal.   Cardiovascular:      Rate and Rhythm: Normal rate and regular rhythm.      Pulses: Normal pulses.      Heart sounds: No murmur heard.     No friction rub. No gallop.   Pulmonary:      Effort: Pulmonary effort is normal.   Abdominal:      General: There is no distension.      Palpations: There is no mass.      Tenderness: There is no abdominal tenderness. There is no guarding or rebound.      Hernia: No hernia is present.   Musculoskeletal:         General: Normal range of motion.   Skin:     General: Skin is warm and dry.      Capillary Refill: Capillary refill takes less than 2 seconds.      Findings: No rash.   Neurological:      General: No focal deficit present.      Mental  Status: She is alert and oriented to person, place, and time.                 MEDICAL DECISION MAKING   Laboratory Results:  Results       Procedure Component Value Units Date/Time    Stool Enteric Bacteria Panel - Salmonella, Shigella, Campylobacter And Shiga Toxin, PCR [[id number]] Collected: 10/02/22 0956    Specimen: Stool Updated: 10/02/22 1005    Stool Clostridioides difficile Toxin B, PCR [[id number]] Collected: 10/02/22 0956    Specimen: Stool Updated: 10/02/22 1005    Basic Metabolic Panel [[id number]]  (Abnormal) Collected: 10/02/22 0332    Specimen: Blood, Venous Updated: 10/02/22 0408     Glucose 104 mg/dL      BUN 5 mg/dL      Creatinine 0.6 mg/dL      Calcium 8.4 mg/dL      Sodium 086 mEq/L      Potassium 3.8 mEq/L      Chloride 107 mEq/L      CO2 22 mEq/L      Anion Gap 8.0     GFR >60.0 mL/min/1.73 m2     Magnesium [[id number]]  (Normal) Collected: 10/02/22 0332    Specimen: Blood, Venous Updated: 10/02/22 0408     Magnesium 1.7 mg/dL     Urinalysis with Reflex to Microscopic Exam and Culture [[id number]]  (Abnormal) Collected: 10/02/22 0238    Specimen: Urine, Clean Catch Updated: 10/02/22 0400    Narrative:      The following orders were created for panel order Urinalysis with Reflex to Microscopic Exam and Culture.  Procedure                               Abnormality  Status                     ---------                               -----------         ------                     Urinalysis with Reflex t.Marland KitchenMarland Kitchen[[id number]]  Abnormal            Final result               Urine [employer] .Marland KitchenMarland Kitchen[[id number]]                      Final result                 Please view results for these tests on the individual orders.    Urine [employer] Tube [[id number]] Collected: 10/02/22 0238    Specimen: Urine, Clean Catch Updated: 10/02/22 0400     Extra Tube Hold for add-ons.    Lactic Acid [[id number]]  (Normal) Collected: 10/02/22 0332    Specimen: Blood, Venous Updated: 10/02/22 0351     Whole Blood  Lactic Acid 1.4 mmol/L     CBC without Differential [[id number]]  (Abnormal) Collected: 10/02/22 0332    Specimen: Blood, Venous Updated: 10/02/22 0350     WBC 4.53 x10 3/uL      Hemoglobin 9.8 g/dL      Hematocrit 25.9 %      Platelet Count 290 x10 3/uL      MPV 11.3 fL      RBC 3.70 x10 6/uL      MCV 84.1 fL      MCH 26.5 pg      MCHC 31.5 g/dL      RDW 14 %      nRBC % 0.0 /100 WBC      Absolute nRBC 0.00 x10 3/uL     Urinalysis with Reflex to Microscopic Exam and Culture [[id number]]  (Abnormal) Collected: 10/02/22 0238    Specimen: Urine, Clean Catch Updated: 10/02/22 0250     Urine Color Colorless     Urine Clarity Clear     Urine Specific Gravity 1.033     Urine pH 7.0     Urine Leukocyte Esterase Negative     Urine Nitrite Negative     Urine Protein Negative     Urine Glucose Negative     Urine Ketones Trace mg/dL      Urine Urobilinogen Normal mg/dL      Urine Bilirubin Negative     Urine Blood Negative    Culture, Blood, Aerobic And Anaerobic [[id number]] Collected: 10/02/22 0113    Specimen: Blood, Venous Updated: 10/02/22 0121    Culture, Blood, Aerobic And Anaerobic [[id number]] Collected: 10/01/22 2301    Specimen: Blood, Venous Updated: 10/01/22 2309    Comprehensive Metabolic Panel [[id number]]  (Abnormal) Collected: 10/01/22 2235    Specimen: Blood, Venous Updated: 10/01/22 2308     Glucose 131 mg/dL      BUN 6 mg/dL      Creatinine 0.7 mg/dL      Sodium 322 mEq/L      Potassium 4.0 mEq/L      Chloride 104 mEq/L      CO2 18 mEq/L      Calcium 9.8 mg/dL  Anion Gap 14.0     GFR >60.0 mL/min/1.73 m2      AST (SGOT) 21 U/L      ALT 12 U/L      Alkaline Phosphatase 63 U/L      Albumin 4.4 g/dL      Protein, Total 8.8 g/dL      Globulin 4.4 g/dL      Albumin/Globulin Ratio 1.0     Bilirubin, Total 0.7 mg/dL     Lipase [[id number]]  (Normal) Collected: 10/01/22 2235    Specimen: Blood, Venous Updated: 10/01/22 2308     Lipase 19 U/L     Magnesium [[id number]]  (Normal) Collected: 10/01/22 2235    Specimen:  Blood, Venous Updated: 10/01/22 2308     Magnesium 1.8 mg/dL     Serum Beta HCG Qualitative [[id number]]  (Normal) Collected: 10/01/22 2235    Specimen: Blood, Venous Updated: 10/01/22 2301     hCG Qualitative Negative    PT/INR [[id number]]  (Normal) Collected: 10/01/22 2235    Specimen: Blood, Venous Updated: 10/01/22 2255     PT 11.3 sec      INR 1.0    Lactic Acid [[id number]]  (Abnormal) Collected: 10/01/22 2235    Specimen: Blood, Venous Updated: 10/01/22 2253     Whole Blood Lactic Acid 3.1 mmol/L     CBC with Differential [[id number]]  (Abnormal) Collected: 10/01/22 2235    Specimen: Blood, Venous Updated: 10/01/22 2249    Narrative:      The following orders were created for panel order CBC with Differential.  Procedure                               Abnormality         Status                     ---------                               -----------         ------                     CBC with Differential[[id number]]        Abnormal            Final result                 Please view results for these tests on the individual orders.    CBC with Differential [[id number]]  (Abnormal) Collected: 10/01/22 2235    Specimen: Blood, Venous Updated: 10/01/22 2249     WBC 3.78 x10 3/uL      Hemoglobin 11.3 g/dL      Hematocrit 35.5 %      Platelet Count 299 x10 3/uL      MPV 10.9 fL      RBC 4.18 x10 6/uL      MCV 81.3 fL      MCH 27.0 pg      MCHC 33.2 g/dL      RDW 14 %      nRBC % 0.0 /100 WBC      Absolute nRBC 0.00 x10 3/uL      Preliminary Absolute Neutrophil Count 2.69 x10 3/uL      Neutrophils % 71.1 %      Lymphocytes % 19.0 %  Monocytes % 6.1 %      Eosinophils % 2.4 %      Basophils % 1.1 %      Immature Granulocytes % 0.3 %      Absolute Neutrophils 2.69 x10 3/uL      Absolute Lymphocytes 0.72 x10 3/uL      Absolute Monocytes 0.23 x10 3/uL      Absolute Eosinophils 0.09 x10 3/uL      Absolute Basophils 0.04 x10 3/uL      Absolute Immature Granulocytes 0.01 x10 3/uL              Radiology Results:  Chest AP  Portable    Result Date: 10/02/2022  No acute abnormality. Johnsie Kindred, MD 10/02/2022 12:33 AM    CT Abd/Pelvis with IV Contrast    Result Date: 10/02/2022  Moderate apparent wall thickening of the entire colon, favor this is due to underdistention. Acute colitis cannot be excluded. Johnsie Kindred, MD 10/02/2022 12:06 AM     Pulse Oximetry Interpretation: 100 % on TA        Nursing Notes: Reviewed available nursing notes.     Medical Records personally reviewed and found to have relevant information from: Nursing notes.      MDM: 47 y.o. female presents with nausea/vomiting/diarrhea along with diffuse abdominal pain starting this afternoon.  Symptoms continued since.  Unable to tolerate p.o.  Arrival, patient appears uncomfortable/in mild distress secondary to current symptoms.  Although difficult to obtain at times secondary to patient's reported discomfort, history as documented.  Exam as documented as well.  Overall history is benign and abdominal examination also reassuring.  However given patient's level of discomfort attempts, workup initiated further assess including abdominal labs and CT scan.  Will treat symptomatically in the interim monitor closely.      Differential diagnosis to include but not limited to: Nausea/vomiting/diarrhea NOS, appendicitis, abscess, perforation, urinary tract infection, mesenteric ischemia (pain out of proportion with examination as pain is reported to be only mild and described as a diffuse cramping (discomfort is secondary to ongoing nausea; no other risk factors including no history of A-fib), acute cholecystitis, pancreatitis, gastroenteritis, enteritis, colitis, diverticulitis sepsis, dehydration      Chronic illness impacting care (obesity, diabetes, hypertension, elderly - state impact):  has a past medical history of Arthritis, Asthma, Breast lump (02/22/2017), Bulging lumbar disc, Calcific tendinitis, Disorder of thyroid, MS (multiple sclerosis), Pneumonia (2011, 2012), and  Sepsis (2011).      Number and Complexity of Problems Addressed (select at least one)    Complexity: High: 1 acute or chronic illness or injury that poses a threat to life or bodily function        ED Course:      ED Course as of 10/02/22 1725   Sun Oct 01, 2022   2330 Whole Blood Lactate(!): 3.1 [ST]   2332 Workup expanded to include chest x-ray as part of sepsis screen despite no pulmonary complaints.  Fluid bolus expanded to 30 cc/kg bolus and empiric antibiotics initiated pending workup. [ST]   Mon Oct 02, 2022   0045 No clear bacterial source of infection at this time. however given ongoing symptoms and abnormalities including elevated lactic acid (which could be indicative of dehydration which is clinically apparent), will admit for continued care. Spoke with Dr. Minus Liberty who graciously agrees to admit [ST]      ED Course User Index  [ST] Garey Ham, DO       ______________________________________________________________________  Amount/complexity of Data Reviewed   {Tip - Level 4 = (1/3 of the following categories); Level 5 = (2/3 categories).   This message will disappear upon signing. :  L\  EKG Interpretation as above, if indicated    Independent visualized and interpretation of radiological study by me:     Type of xray performed : CXR  Independent Interpretation by me: No consolidation    Diagnostic tests appropriately considered even if not ultimately performed: Labs, x-ray, CT, ultrasound    ______________________________________________________________________    Risk of Complications and/or Morbidity or Mortality of Patient Management  (select one if applicable)    Risk: High Risk and Decision regarding hospitalization or escalation of hospital level of care      IMPRESSION AND DISPOSITION     Clinical Impression   1. Dehydration    2. Colitis    3. Elevated lactic acid level          Chart Ownership: I, Francie Massing, DO, am the primary clinician of record.       Garey Ham,  DO  10/02/22 1726

## 2022-10-01 NOTE — ED Provider Notes (Incomplete)
Chief Complaint   Patient presents with   . Abdominal Pain   . Emesis         History Provided by: patient and wife    Additional History: Leahmarie M Sorrentino is a 47 y.o. female with a history of  has a past medical history of Arthritis, Asthma, Breast lump (02/22/2017), Bulging lumbar disc, Calcific tendinitis, Disorder of thyroid, MS (multiple sclerosis), Pneumonia (2011, 2012), and Sepsis (2011)., presenting to the ED with the complaint of nausea/vomiting/diarrhea along with diffuse abdominal pain starting this afternoon.  Symptoms have persisted since.  No blood in stool or vomit.  Unable to tolerate p.o.  Patient also states that she feels dehydrated and weak/fatigued.  No known ingestion of contaminated food or drink.  No sick contacts.  No urogenital complaint.Marland Kitchen    PCP: Hurley Cisco, MD    Specialists:           REVIEW OF SYMPTOMS     Review of Systems   Constitutional:  Negative for chills, diaphoresis and fever.   Respiratory:  Negative for shortness of breath.    Cardiovascular:  Negative for chest pain.   Gastrointestinal:  Positive for abdominal pain, diarrhea, nausea and vomiting. Negative for blood in stool and melena.   Genitourinary:  Negative for dysuria, frequency, hematuria and urgency.   Skin:  Negative for rash.   Neurological:  Negative for dizziness and loss of consciousness.             PAST HISTORY     Medical/Surgical History:   Past Medical History:   Diagnosis Date   . Arthritis     lower back   . Asthma    . Breast lump 02/22/2017    painful lump left breast   . Bulging lumbar disc    . Calcific tendinitis    . Disorder of thyroid     Per pt, doctor said no longer hypothyroid   . MS (multiple sclerosis)    . Pneumonia 2011, 2012   . Sepsis 2011     Past Surgical History:   Procedure Laterality Date   . CESAREAN SECTION     . CHOLECYSTECTOMY     . DEBRIDEMENT & IRRIGATION, ABSCESS  2015       Social History:   Social History     Tobacco Use   . Smoking status: Former     Types:  Cigarettes   . Smokeless tobacco: Never   Vaping Use   . Vaping status: Never Used   Substance Use Topics   . Alcohol use: Not Currently     Comment: occasionally   . Drug use: Yes     Types: Marijuana     Comment: for her MS- medical marijuana, occasional        Family History:   Family History   Problem Relation Age of Onset   . Cancer Father    . Diabetes Maternal Aunt    . Cancer Maternal Aunt    . Breast cancer Maternal Aunt        Medications:   The patient's home medications have been reviewed.    Allergies:   Penicillins             PHYSICAL EXAM     Temp: 97.1 F (36.2 C)  Heart Rate: 94  Resp Rate: 22  BP: 131/81  SpO2: 100 %  Pain Score: 7-severe pain    Physical Exam  Vitals and nursing note reviewed.   Constitutional:  General: She is not in acute distress.     Appearance: She is well-developed. She is not ill-appearing, toxic-appearing or diaphoretic.      Comments: Uncomfortable appearing, moaning.  Patient states that she is doing so because she feels dehydrated and weak   HENT:      Head: Normocephalic and atraumatic.      Mouth/Throat:      Mouth: Mucous membranes are dry.      Pharynx: Oropharynx is clear.   Eyes:      Conjunctiva/sclera: Conjunctivae normal.   Cardiovascular:      Rate and Rhythm: Normal rate and regular rhythm.      Pulses: Normal pulses.      Heart sounds: No murmur heard.     No friction rub. No gallop.   Pulmonary:      Effort: Pulmonary effort is normal.   Abdominal:      General: There is no distension.      Palpations: There is no mass.      Tenderness: There is no abdominal tenderness. There is no guarding or rebound.      Hernia: No hernia is present.   Musculoskeletal:         General: Normal range of motion.   Skin:     General: Skin is warm and dry.      Capillary Refill: Capillary refill takes less than 2 seconds.      Findings: No rash.   Neurological:      General: No focal deficit present.      Mental Status: She is alert and oriented to person, place, and  time.                 MEDICAL DECISION MAKING   Laboratory Results:  Results       Procedure Component Value Units Date/Time    Culture, Blood, Aerobic And Anaerobic [[id number]] Collected: 10/01/22 2301    Specimen: Blood, Venous Updated: 10/01/22 2309    Comprehensive Metabolic Panel [[id number]]  (Abnormal) Collected: 10/01/22 2235    Specimen: Blood, Venous Updated: 10/01/22 2308     Glucose 131 mg/dL      BUN 6 mg/dL      Creatinine 0.7 mg/dL      Sodium 865 mEq/L      Potassium 4.0 mEq/L      Chloride 104 mEq/L      CO2 18 mEq/L      Calcium 9.8 mg/dL      Anion Gap 78.4     GFR >60.0 mL/min/1.73 m2      AST (SGOT) 21 U/L      ALT 12 U/L      Alkaline Phosphatase 63 U/L      Albumin 4.4 g/dL      Protein, Total 8.8 g/dL      Globulin 4.4 g/dL      Albumin/Globulin Ratio 1.0     Bilirubin, Total 0.7 mg/dL     Lipase [[id number]]  (Normal) Collected: 10/01/22 2235    Specimen: Blood, Venous Updated: 10/01/22 2308     Lipase 19 U/L     Magnesium [[id number]]  (Normal) Collected: 10/01/22 2235    Specimen: Blood, Venous Updated: 10/01/22 2308     Magnesium 1.8 mg/dL     Serum Beta HCG Qualitative [[id number]]  (Normal) Collected: 10/01/22 2235    Specimen: Blood, Venous Updated: 10/01/22 2301     hCG Qualitative Negative    PT/INR [[id number]]  (Normal) Collected: 10/01/22 2235  Specimen: Blood, Venous Updated: 10/01/22 2255     PT 11.3 sec      INR 1.0    Lactic Acid [[id number]]  (Abnormal) Collected: 10/01/22 2235    Specimen: Blood, Venous Updated: 10/01/22 2253     Whole Blood Lactic Acid 3.1 mmol/L     CBC with Differential [[id number]]  (Abnormal) Collected: 10/01/22 2235    Specimen: Blood, Venous Updated: 10/01/22 2249    Narrative:      The following orders were created for panel order CBC with Differential.  Procedure                               Abnormality         Status                     ---------                               -----------         ------                     CBC with  Differential[[id number]]        Abnormal            Final result                 Please view results for these tests on the individual orders.    CBC with Differential [[id number]]  (Abnormal) Collected: 10/01/22 2235    Specimen: Blood, Venous Updated: 10/01/22 2249     WBC 3.78 x10 3/uL      Hemoglobin 11.3 g/dL      Hematocrit 08.6 %      Platelet Count 299 x10 3/uL      MPV 10.9 fL      RBC 4.18 x10 6/uL      MCV 81.3 fL      MCH 27.0 pg      MCHC 33.2 g/dL      RDW 14 %      nRBC % 0.0 /100 WBC      Absolute nRBC 0.00 x10 3/uL      Preliminary Absolute Neutrophil Count 2.69 x10 3/uL      Neutrophils % 71.1 %      Lymphocytes % 19.0 %      Monocytes % 6.1 %      Eosinophils % 2.4 %      Basophils % 1.1 %      Immature Granulocytes % 0.3 %      Absolute Neutrophils 2.69 x10 3/uL      Absolute Lymphocytes 0.72 x10 3/uL      Absolute Monocytes 0.23 x10 3/uL      Absolute Eosinophils 0.09 x10 3/uL      Absolute Basophils 0.04 x10 3/uL      Absolute Immature Granulocytes 0.01 x10 3/uL              Radiology Results:  No results found.    Pulse Oximetry Interpretation: 100 % on TA      EKG Interpretation:  Signed and interpreted by the EP.    Time Interpreted: ***   Rate: ***   Interpretation: ***       Nursing Notes: Reviewed available nursing notes.     Medical Records personally reviewed and found to have relevant  information from: {Records review:60251}      MDM: 47 y.o. female presents with ***      Differential diagnosis to include but not limited to: ***      Chronic illness impacting care (obesity, diabetes, hypertension, elderly - state impact):  has a past medical history of Arthritis, Asthma, Breast lump (02/22/2017), Bulging lumbar disc, Calcific tendinitis, Disorder of thyroid, MS (multiple sclerosis), Pneumonia (2011, 2012), and Sepsis (2011).      Number and Complexity of Problems Addressed (select at least one)    {Complexity:58839}      ED Course:      ED Course as of 10/01/22 2337   Wynelle Link Oct 01, 2022    2330 Whole Blood Lactate(!): 3.1 [ST]   2332 Workup expanded to include chest x-ray as part of sepsis screen despite no pulmonary complaints.  Fluid bolus expanded to 30 cc/kg bolus and empiric antibiotics initiated pending workup. [ST]      ED Course User Index  [ST] Garey Ham, DO       ______________________________________________________________________  Amount/complexity of Data Reviewed   {Tip - Level 4 = (1/3 of the following categories); Level 5 = (2/3 categories).   This message will disappear upon signing. :  L\  EKG Interpretation as above, if indicated    Independent visualized and interpretation of radiological study by me:     Type of xray performed : ***  Independent Interpretation by me: ***    Diagnostic tests appropriately considered even if not ultimately performed: ***    Discussion of management with other providers:   Consulted with  ***. Patient condition and all pertinent labs and/or radiology studies discussed with physician.     ______________________________________________________________________    Risk of Complications and/or Morbidity or Mortality of Patient Management  (select one if applicable)    {YSAY:30160}    IMPRESSION AND DISPOSITION     Clinical Impression   No diagnosis found.      Chart Ownership: Paul Half, DO, am the primary clinician of record.

## 2022-10-01 NOTE — ED Triage Notes (Signed)
Pt BIBA for N/V/D Abd pain onset 1500. Hx MS, on no meds for same. Given 4mg  Zofran in route.

## 2022-10-01 NOTE — ED Notes (Signed)
Bed: 20  Expected date:   Expected time:   Means of arrival:   Comments:  622B: 51F, N/V/D/Abd pain

## 2022-10-02 ENCOUNTER — Emergency Department: Payer: 59

## 2022-10-02 DIAGNOSIS — E860 Dehydration: Secondary | ICD-10-CM

## 2022-10-02 DIAGNOSIS — K529 Noninfective gastroenteritis and colitis, unspecified: Secondary | ICD-10-CM | POA: Diagnosis present

## 2022-10-02 DIAGNOSIS — G35 Multiple sclerosis: Secondary | ICD-10-CM

## 2022-10-02 LAB — BASIC METABOLIC PANEL
Anion Gap: 8 (ref 5.0–15.0)
BUN: 5 mg/dL — ABNORMAL LOW (ref 7–21)
CO2: 22 mEq/L (ref 17–29)
Calcium: 8.4 mg/dL — ABNORMAL LOW (ref 8.5–10.5)
Chloride: 107 mEq/L (ref 99–111)
Creatinine: 0.6 mg/dL (ref 0.4–1.0)
GFR: >60 mL/min/{1.73_m2} (ref >=60.0)
Glucose: 104 mg/dL — ABNORMAL HIGH (ref 70–100)
Potassium: 3.8 mEq/L (ref 3.5–5.3)
Sodium: 137 mEq/L (ref 135–145)

## 2022-10-02 LAB — CBC
Absolute nRBC: 0 10*3/uL (ref <=0.00)
Hematocrit: 31.1 % — ABNORMAL LOW (ref 34.7–43.7)
Hemoglobin: 9.8 g/dL — ABNORMAL LOW (ref 11.4–14.8)
MCH: 26.5 pg (ref 25.1–33.5)
MCHC: 31.5 g/dL (ref 31.5–35.8)
MCV: 84.1 fL (ref 78.0–96.0)
MPV: 11.3 fL (ref 8.9–12.5)
Platelet Count: 290 10*3/uL (ref 142–346)
RBC: 3.7 10*6/uL — ABNORMAL LOW (ref 3.90–5.10)
RDW: 14 % (ref 11–15)
WBC: 4.53 10*3/uL (ref 3.10–9.50)
nRBC %: 0 /100 WBC (ref <=0.0)

## 2022-10-02 LAB — LACTIC ACID: Whole Blood Lactic Acid: 1.4 mmol/L (ref 0.2–2.0)

## 2022-10-02 LAB — LAB USE ONLY - URINALYSIS WITH REFLEX TO MICROSCOPIC EXAM AND CULTURE
Urine Bilirubin: NEGATIVE
Urine Blood: NEGATIVE
Urine Glucose: NEGATIVE
Urine Leukocyte Esterase: NEGATIVE
Urine Nitrite: NEGATIVE
Urine Protein: NEGATIVE
Urine Specific Gravity: 1.033 (ref 1.001–1.035)
Urine Urobilinogen: NORMAL mg/dL (ref 0.2–2.0)
Urine pH: 7 (ref 5.0–8.0)

## 2022-10-02 LAB — MAGNESIUM: Magnesium: 1.7 mg/dL (ref 1.6–2.6)

## 2022-10-02 LAB — LAB USE ONLY - URINE GRAY CULTURE HOLD TUBE

## 2022-10-02 MED ORDER — GLUCAGON 1 MG IJ SOLR (WRAP)
1.0000 mg | INTRAMUSCULAR | Status: DC | PRN
Start: 2022-10-02 — End: 2022-10-09

## 2022-10-02 MED ORDER — HYDROCODONE-ACETAMINOPHEN 5-325 MG PO TABS
1.0000 | ORAL_TABLET | Freq: Four times a day (QID) | ORAL | Status: DC | PRN
Start: 2022-10-02 — End: 2022-10-09
  Administered 2022-10-02 – 2022-10-05 (×7): 1 via ORAL
  Filled 2022-10-02 (×5): qty 1

## 2022-10-02 MED ORDER — STERILE WATER FOR INJECTION IJ/IV SOLN (WRAP)
1.0000 g | INTRAMUSCULAR | Status: DC
Start: 2022-10-02 — End: 2022-10-06
  Administered 2022-10-02 – 2022-10-06 (×5): 1 g via INTRAVENOUS
  Filled 2022-10-02 (×4): qty 1000

## 2022-10-02 MED ORDER — GLUCOSE 40 % PO GEL (WRAP)
15.0000 g | ORAL | Status: DC | PRN
Start: 2022-10-02 — End: 2022-10-09

## 2022-10-02 MED ORDER — SODIUM CHLORIDE 0.9 % IV SOLN
INTRAVENOUS | Status: DC
Start: 2022-10-02 — End: 2022-10-08

## 2022-10-02 MED ORDER — ONDANSETRON HCL 4 MG/2ML IJ SOLN
4.0000 mg | Freq: Four times a day (QID) | INTRAMUSCULAR | Status: DC | PRN
Start: 2022-10-02 — End: 2022-10-09
  Administered 2022-10-02 – 2022-10-09 (×13): 4 mg via INTRAVENOUS
  Filled 2022-10-02 (×9): qty 2

## 2022-10-02 MED ORDER — METRONIDAZOLE IN NACL 500 MG/100 ML IV SOLN (WRAP)
500.0000 mg | Freq: Three times a day (TID) | INTRAVENOUS | Status: DC
Start: 2022-10-02 — End: 2022-10-06
  Administered 2022-10-02 – 2022-10-06 (×14): 500 mg via INTRAVENOUS
  Filled 2022-10-02 (×9): qty 100

## 2022-10-02 MED ORDER — NALOXONE HCL 0.4 MG/ML IJ SOLN (WRAP)
0.2000 mg | INTRAMUSCULAR | Status: DC | PRN
Start: 2022-10-02 — End: 2022-10-09

## 2022-10-02 MED ORDER — MAGNESIUM SULFATE IN D5W 1-5 GM/100ML-% IV SOLN
1.0000 g | INTRAVENOUS | Status: DC | PRN
Start: 2022-10-02 — End: 2022-10-09
  Administered 2022-10-02 – 2022-10-08 (×4): 1 g via INTRAVENOUS
  Filled 2022-10-02: qty 100

## 2022-10-02 MED ORDER — POTASSIUM CHLORIDE CRYS ER 20 MEQ PO TBCR
0.0000 meq | EXTENDED_RELEASE_TABLET | ORAL | Status: DC | PRN
Start: 2022-10-02 — End: 2022-10-09
  Administered 2022-10-02: 20 meq via ORAL
  Administered 2022-10-06: 40 meq via ORAL
  Filled 2022-10-02: qty 2

## 2022-10-02 MED ORDER — POTASSIUM CHLORIDE 10 MEQ/100ML IV SOLN
10.0000 meq | INTRAVENOUS | Status: DC | PRN
Start: 2022-10-02 — End: 2022-10-09
  Administered 2022-10-06: 10 meq via INTRAVENOUS

## 2022-10-02 MED ORDER — DEXTROSE 50 % IV SOLN
12.5000 g | INTRAVENOUS | Status: DC | PRN
Start: 2022-10-02 — End: 2022-10-09

## 2022-10-02 MED ORDER — BENZOCAINE-MENTHOL MT LOZG (WRAP)
1.0000 | LOZENGE | OROMUCOSAL | Status: DC | PRN
Start: 2022-10-02 — End: 2022-10-09

## 2022-10-02 MED ORDER — SALINE SPRAY 0.65 % NA SOLN
2.0000 | NASAL | Status: DC | PRN
Start: 2022-10-02 — End: 2022-10-09

## 2022-10-02 MED ORDER — ENOXAPARIN SODIUM 40 MG/0.4ML IJ SOSY
40.0000 mg | PREFILLED_SYRINGE | Freq: Every day | INTRAMUSCULAR | Status: DC
Start: 2022-10-02 — End: 2022-10-09
  Administered 2022-10-02 – 2022-10-09 (×8): 40 mg via SUBCUTANEOUS
  Filled 2022-10-02 (×6): qty 0.4

## 2022-10-02 MED ORDER — ONDANSETRON HCL 4 MG/2ML IJ SOLN
4.0000 mg | Freq: Three times a day (TID) | INTRAMUSCULAR | Status: DC | PRN
Start: 2022-10-02 — End: 2022-10-02

## 2022-10-02 MED ORDER — MORPHINE SULFATE 2 MG/ML IJ/IV SOLN (WRAP)
2.0000 mg | Status: DC | PRN
Start: 2022-10-02 — End: 2022-10-09
  Filled 2022-10-02: qty 1

## 2022-10-02 MED ORDER — ACETAMINOPHEN 325 MG PO TABS
650.0000 mg | ORAL_TABLET | Freq: Four times a day (QID) | ORAL | Status: DC | PRN
Start: 2022-10-02 — End: 2022-10-09

## 2022-10-02 MED ORDER — POTASSIUM & SODIUM PHOSPHATES 280-160-250 MG PO PACK
2.0000 | PACK | ORAL | Status: DC | PRN
Start: 2022-10-02 — End: 2022-10-09

## 2022-10-02 MED ORDER — CARBOXYMETHYLCELLULOSE SODIUM 0.5 % OP SOLN
1.0000 [drp] | Freq: Three times a day (TID) | OPHTHALMIC | Status: DC | PRN
Start: 2022-10-02 — End: 2022-10-09

## 2022-10-02 MED ORDER — MELATONIN 3 MG PO TABS
3.0000 mg | ORAL_TABLET | Freq: Every evening | ORAL | Status: DC | PRN
Start: 2022-10-02 — End: 2022-10-09
  Filled 2022-10-02: qty 1

## 2022-10-02 MED ORDER — BENZONATATE 100 MG PO CAPS
100.0000 mg | ORAL_CAPSULE | Freq: Three times a day (TID) | ORAL | Status: DC | PRN
Start: 2022-10-02 — End: 2022-10-09

## 2022-10-02 MED ORDER — DEXTROSE 10 % IV BOLUS
12.5000 g | INTRAVENOUS | Status: DC | PRN
Start: 2022-10-02 — End: 2022-10-09

## 2022-10-02 NOTE — Plan of Care (Signed)
Problem: Pain interferes with ability to perform ADL  Goal: Pain at adequate level as identified by patient  Outcome: Progressing     Problem: Side Effects from Pain Analgesia  Goal: Patient will experience minimal side effects of analgesic therapy  Outcome: Progressing     Problem: Moderate/High Fall Risk Score >5  Goal: Patient will remain free of falls  Outcome: Progressing     Problem: Safety  Goal: Patient will be free from injury during hospitalization  Outcome: Progressing     Problem: Fluid and Electrolyte Imbalance/ Endocrine  Goal: Fluid and electrolyte balance are achieved/maintained  Outcome: Progressing

## 2022-10-02 NOTE — H&P (Signed)
[facility] H&P Note  Patient Info:   Date/Time: 10/02/2022 / 1:04 AM   Admit Date:10/01/2022  Patient Name:Judy Freeman   ZOX:[id number]   PCP: Hurley Cisco, MD  Attending Physician:Troncone, Hillery Hunter, DO    Assessment/Plan:   1.  Nausea vomiting diarrhea associated with elevated lactic acid with tachycardia suspect SIRS/septic, immunocompromised patient due to MS treatment  Due to acute colitis CT abdomen pelvis shows evidence of wall thickening of entire colon  No recent travel no recent antibiotic use no sick contact  Creatinine normal platelet count normal  Associated with hemoconcentration with elevated serum total protein from dehydration  N.p.o. IV fluid repeat lactic acid continue IV ceftriaxone and Flagyl  Patient had similar symptoms 2 years ago GI Dr. Mliss Sax consult in a.m.  2.  MS on Kesiimpta monthly last dose 1 month ago  Chronic pain syndrome from MS on hydrocodone at home we will continue    Patient has BMI=Body mass index is 30.1 kg/m.  Diagnosis: Obesity based on BMI criteria         Recent Labs   Lab 10/01/22  2235   Hemoglobin 11.3*   Hematocrit 34.0*   MCV 81.3   WBC 3.78   Platelet Count 299         Anemia Diagnosis: Unspecified Anemia (currently unable to determine type)  DVT Prophylaxis:Lovenox  Central Line/Foley Catheter/PICC line status: None  Code Status: Full code  Disposition:home  Type of Admission:Inpatient  Estimated Length of Stay (including stay in the ER receiving treatment): 2 to 3 days  Milestones required for discharge:as above  Clinical Information and History:   Chief Complaint:  Chief Complaint   Patient presents with    Abdominal Pain    Emesis           Past Medical History:  Past Medical History:   Diagnosis Date    Arthritis     lower back    Asthma     Breast lump 02/22/2017    painful lump left breast    Bulging lumbar disc     Calcific tendinitis     Disorder of thyroid     Per pt, doctor said no longer hypothyroid    MS (multiple sclerosis)     Pneumonia  2011, 2012    Sepsis 2011     Past Surgical History:  Past Surgical History:   Procedure Laterality Date    CESAREAN SECTION      CHOLECYSTECTOMY      DEBRIDEMENT & IRRIGATION, ABSCESS  2015     Family History:  Family History   Problem Relation Age of Onset    Cancer Father     Diabetes Maternal Aunt     Cancer Maternal Aunt     Breast cancer Maternal Aunt     Reviewed  Social History:  Social History     Substance and Sexual Activity   Alcohol Use Not Currently    Comment: occasionally     Social History     Substance and Sexual Activity   Drug Use Yes    Types: Marijuana    Comment: for her MS- medical marijuana, occasional     Social History     Tobacco Use   Smoking Status Former    Types: Cigarettes   Smokeless Tobacco Never     Social History     Socioeconomic History    Marital status: Married     Spouse name: None    Number of  children: None    Years of education: None    Highest education level: None   Occupational History    None   Tobacco Use    Smoking status: Former     Types: Cigarettes    Smokeless tobacco: Never   Vaping Use    Vaping status: Never Used   Substance and Sexual Activity    Alcohol use: Not Currently     Comment: occasionally    Drug use: Yes     Types: Marijuana     Comment: for her MS- medical marijuana, occasional    Sexual activity: Yes   Other Topics Concern    None   Social History Narrative    None     Social Determinants of Health     Financial Resource Strain: High Risk (06/24/2020)    Overall Financial Resource Strain (CARDIA)     Difficulty of Paying Living Expenses: Hard   Food Insecurity: No Food Insecurity (10/01/2022)    Hunger Vital Sign     Worried About Running Out of Food in the Last Year: Never true     Ran Out of Food in the Last Year: Never true   Transportation Needs: No Transportation Needs (10/01/2022)    PRAPARE - Therapist, art (Medical): No     Lack of Transportation (Non-Medical): No   Physical Activity: Inactive (06/24/2020)     Exercise Vital Sign     Days of Exercise per Week: 0 days     Minutes of Exercise per Session: 0 min   Stress: Stress Concern Present (06/24/2020)    [employer] of Occupational Health - Occupational Stress Questionnaire     Feeling of Stress : Rather much   Social Connections: Unknown (06/24/2020)    Social Connection and Isolation Panel [NHANES]     Frequency of Communication with Friends and Family: Patient declined     Frequency of Social Gatherings with Friends and Family: Patient declined     Attends Religious Services: Patient declined     Database administrator or Organizations: No     Attends Banker Meetings: Never     Marital Status: Married   Catering manager Violence: Not At Risk (10/01/2022)    Humiliation, Afraid, Rape, and Kick questionnaire     Fear of Current or Ex-Partner: No     Emotionally Abused: No     Physically Abused: No     Sexually Abused: No   Housing Stability: Unknown (10/01/2022)    Housing Stability Vital Sign     Unable to Pay for Housing in the Last Year: No     Number of Places Lived in the Last Year: Not on file     Unstable Housing in the Last Year: No     Allergies:  Allergies   Allergen Reactions    Penicillins Hives     Medications:(Not in a hospital admission)    Clinical Presentation: HPI:   Judy Freeman is a 47 y.o. female who has history of   Past Surgical History:   Procedure Laterality Date    CESAREAN SECTION      CHOLECYSTECTOMY      DEBRIDEMENT & IRRIGATION, ABSCESS  2015      Past Medical History:   Diagnosis Date    Arthritis     lower back    Asthma     Breast lump 02/22/2017    painful lump left breast  Bulging lumbar disc     Calcific tendinitis     Disorder of thyroid     Per pt, doctor said no longer hypothyroid    MS (multiple sclerosis)     Pneumonia 2011, 2012    Sepsis 2011    came with   the chief complaint of Abdominal Pain and Emesis  47 year old female with medical history of MS chronic pain syndrome.  Patient presented to ED with  multiple episode of nausea and vomiting associated with loose watery diarrhea for last 1 day.  Denies any recent travel or sick contact.  Also complaining of chills denies any fever chest pain.  Review of Systems:   Chief Complaint:  Abdominal Pain and Emesis    Review of Systems   Constitutional:  Negative for chills, fever, malaise/fatigue and weight loss.   HENT: Negative.  Negative for hearing loss and tinnitus.    Eyes: Negative.    Respiratory:  Negative for cough, hemoptysis, sputum production, shortness of breath and wheezing.    Cardiovascular:  Negative for chest pain, palpitations and orthopnea.   Gastrointestinal:  Positive for abdominal pain, diarrhea, nausea and vomiting. Negative for heartburn.   Genitourinary: Negative.    Musculoskeletal: Negative.    Skin: Negative.    Neurological: Negative.  Negative for dizziness, tingling, sensory change, speech change, focal weakness, seizures, loss of consciousness, weakness and headaches.   Endo/Heme/Allergies: Negative.    Psychiatric/Behavioral:  Negative for depression, hallucinations, memory loss, substance abuse and suicidal ideas.      Physical Exam:     Vitals:    10/01/22 2222 10/01/22 2308   BP: 131/81 132/87   Pulse: 94 85   Resp: 22 (!) 25   Temp: 97.1 F (36.2 C)    TempSrc: Temporal    SpO2: 100% 100%   Weight: 84.6 kg (186 lb 8.2 oz)    Height: 1.676 m (5\' 6" )      Physical Exam:   Physical Exam  Vitals reviewed.   Constitutional:       Appearance: Normal appearance.   HENT:      Head: Normocephalic and atraumatic.      Nose: Nose normal.      Mouth/Throat:      Mouth: Mucous membranes are dry.      Pharynx: Oropharynx is clear.   Eyes:      Extraocular Movements: Extraocular movements intact.      Conjunctiva/sclera: Conjunctivae normal.      Pupils: Pupils are equal, round, and reactive to light.   Cardiovascular:      Rate and Rhythm: Normal rate and regular rhythm.      Pulses: Normal pulses.      Heart sounds: Normal heart sounds.    Pulmonary:      Effort: Pulmonary effort is normal.      Breath sounds: Normal breath sounds.   Abdominal:      General: Abdomen is flat.      Palpations: Abdomen is soft.      Tenderness: There is abdominal tenderness.   Musculoskeletal:         General: Normal range of motion.      Cervical back: Normal range of motion and neck supple.   Skin:     General: Skin is warm and dry.      Capillary Refill: Capillary refill takes less than 2 seconds.   Neurological:      General: No focal deficit present.      Mental Status:  She is alert and oriented to person, place, and time.   Psychiatric:         Mood and Affect: Mood normal.         Behavior: Behavior normal.       Results of Labs/imaging:   Labs have been reviewed:   Coagulation Profile:   Recent Labs   Lab 10/01/22  2235   PT 11.3   INR 1.0       CBC review:   Recent Labs   Lab 10/01/22  2235   WBC 3.78   Hemoglobin 11.3*   Hematocrit 34.0*   Platelet Count 299   MCV 81.3   RDW 14   Neutrophils % 71.1   Lymphocytes % 19.0   Eosinophils % 2.4   Immature Granulocytes % 0.3   Absolute Neutrophils 2.69   Absolute Immature Granulocytes 0.01     Chem Review:  Recent Labs   Lab 10/01/22  2235   Sodium 136   Potassium 4.0   Chloride 104   CO2 18   BUN 6*   Creatinine 0.7   Glucose 131*   Calcium 9.8   Magnesium 1.8   Bilirubin, Total 0.7   AST (SGOT) 21   ALT 12   Alkaline Phosphatase 63     Results       Procedure Component Value Units Date/Time    Culture, Blood, Aerobic And Anaerobic [[id number]] Collected: 10/01/22 2301    Specimen: Blood, Venous Updated: 10/01/22 2309    Comprehensive Metabolic Panel [[id number]]  (Abnormal) Collected: 10/01/22 2235    Specimen: Blood, Venous Updated: 10/01/22 2308     Glucose 131 mg/dL      BUN 6 mg/dL      Creatinine 0.7 mg/dL      Sodium 425 mEq/L      Potassium 4.0 mEq/L      Chloride 104 mEq/L      CO2 18 mEq/L      Calcium 9.8 mg/dL      Anion Gap 95.6     GFR >60.0 mL/min/1.73 m2      AST (SGOT) 21 U/L      ALT 12 U/L       Alkaline Phosphatase 63 U/L      Albumin 4.4 g/dL      Protein, Total 8.8 g/dL      Globulin 4.4 g/dL      Albumin/Globulin Ratio 1.0     Bilirubin, Total 0.7 mg/dL     Lipase [[id number]]  (Normal) Collected: 10/01/22 2235    Specimen: Blood, Venous Updated: 10/01/22 2308     Lipase 19 U/L     Magnesium [[id number]]  (Normal) Collected: 10/01/22 2235    Specimen: Blood, Venous Updated: 10/01/22 2308     Magnesium 1.8 mg/dL     Serum Beta HCG Qualitative [[id number]]  (Normal) Collected: 10/01/22 2235    Specimen: Blood, Venous Updated: 10/01/22 2301     hCG Qualitative Negative    PT/INR [[id number]]  (Normal) Collected: 10/01/22 2235    Specimen: Blood, Venous Updated: 10/01/22 2255     PT 11.3 sec      INR 1.0    Lactic Acid [[id number]]  (Abnormal) Collected: 10/01/22 2235    Specimen: Blood, Venous Updated: 10/01/22 2253     Whole Blood Lactic Acid 3.1 mmol/L     CBC with Differential [[id number]]  (Abnormal) Collected: 10/01/22 2235    Specimen: Blood, Venous Updated: 10/01/22 2249    Narrative:  The following orders were created for panel order CBC with Differential.  Procedure                               Abnormality         Status                     ---------                               -----------         ------                     CBC with Differential[[id number]]        Abnormal            Final result                 Please view results for these tests on the individual orders.    CBC with Differential [[id number]]  (Abnormal) Collected: 10/01/22 2235    Specimen: Blood, Venous Updated: 10/01/22 2249     WBC 3.78 x10 3/uL      Hemoglobin 11.3 g/dL      Hematocrit 13.0 %      Platelet Count 299 x10 3/uL      MPV 10.9 fL      RBC 4.18 x10 6/uL      MCV 81.3 fL      MCH 27.0 pg      MCHC 33.2 g/dL      RDW 14 %      nRBC % 0.0 /100 WBC      Absolute nRBC 0.00 x10 3/uL      Preliminary Absolute Neutrophil Count 2.69 x10 3/uL      Neutrophils % 71.1 %      Lymphocytes % 19.0 %      Monocytes % 6.1 %       Eosinophils % 2.4 %      Basophils % 1.1 %      Immature Granulocytes % 0.3 %      Absolute Neutrophils 2.69 x10 3/uL      Absolute Lymphocytes 0.72 x10 3/uL      Absolute Monocytes 0.23 x10 3/uL      Absolute Eosinophils 0.09 x10 3/uL      Absolute Basophils 0.04 x10 3/uL      Absolute Immature Granulocytes 0.01 x10 3/uL           Radiology reports have been reviewed:  Radiology Results (24 Hour)       Procedure Component Value Units Date/Time    Chest AP Portable [[id number]] Collected: 10/02/22 0032    Order Status: Completed Updated: 10/02/22 0035    Narrative:      HISTORY: Sepsis    COMPARISON: 06/23/2022.    FINDINGS:     LUNGS: No consolidation or edema.    PLEURA: No pleural effusions or pneumothorax.    HEART AND MEDIASTINUM:  No cardiac enlargement.    BONES:  Suboptimally evaluated.      Impression:        No acute abnormality.    Johnsie Kindred, MD  10/02/2022 12:33 AM    CT Abd/Pelvis with IV Contrast [[id number]] Collected: 10/01/22 2359    Order Status: Completed Updated: 10/02/22 0008    Narrative:      HISTORY: Abdominal  pain, nausea, vomiting, diarrhea.     COMPARISON: None available.    TECHNIQUE: CT abdomen and pelvis WITH intravenous contrast. The following  dose reduction techniques were utilized: Automated exposure control and/or  adjustment of the mA and/or kV according to patient size, and the use of  iterative reconstruction technique.    CONTRAST: iohexol (OMNIPAQUE) 350 MG/ML injection 100 mL.     FINDINGS:     LOWER THORAX: Status post cholecystectomy. No biliary dilation.    LIVER/BILIARY TREE: No mass or intrahepatic biliary dilatation. No  gallbladder distension or calcified gallstones.  SPLEEN: No splenomegaly.  PANCREAS: No pancreatic mass or duct dilatation.    KIDNEYS/URETERS: No hydronephrosis, stones or solid mass lesions.  ADRENALS: No adrenal mass.  PELVIC ORGANS/BLADDER: No pelvic masses.    PERITONEUM/RETROPERITONEUM: No free air or fluid.  LYMPH NODES: No  lymphadenopathy.  VESSELS: No aortic aneurysm.    GI TRACT: Moderate apparent wall thickening of the entire colon, favor this  is due to underdistention. Acute colitis cannot be excluded. No bowel  obstruction. Normal appendix.    BONES AND SOFT TISSUES: Unremarkable.      Impression:        Moderate apparent wall thickening of the entire colon, favor this is due to  underdistention. Acute colitis cannot be excluded.     Johnsie Kindred, MD  10/02/2022 12:06 AM          Chest AP Portable    Result Date: 10/02/2022  HISTORY: Sepsis COMPARISON: 06/23/2022. FINDINGS: LUNGS: No consolidation or edema. PLEURA: No pleural effusions or pneumothorax. HEART AND MEDIASTINUM:  No cardiac enlargement. BONES:  Suboptimally evaluated.     No acute abnormality. Johnsie Kindred, MD 10/02/2022 12:33 AM    CT Abd/Pelvis with IV Contrast    Result Date: 10/02/2022  HISTORY: Abdominal pain, nausea, vomiting, diarrhea. COMPARISON: None available. TECHNIQUE: CT abdomen and pelvis WITH intravenous contrast. The following dose reduction techniques were utilized: Automated exposure control and/or adjustment of the mA and/or kV according to patient size, and the use of iterative reconstruction technique. CONTRAST: iohexol (OMNIPAQUE) 350 MG/ML injection 100 mL. FINDINGS: LOWER THORAX: Status post cholecystectomy. No biliary dilation. LIVER/BILIARY TREE: No mass or intrahepatic biliary dilatation. No gallbladder distension or calcified gallstones. SPLEEN: No splenomegaly. PANCREAS: No pancreatic mass or duct dilatation. KIDNEYS/URETERS: No hydronephrosis, stones or solid mass lesions. ADRENALS: No adrenal mass. PELVIC ORGANS/BLADDER: No pelvic masses. PERITONEUM/RETROPERITONEUM: No free air or fluid. LYMPH NODES: No lymphadenopathy. VESSELS: No aortic aneurysm. GI TRACT: Moderate apparent wall thickening of the entire colon, favor this is due to underdistention. Acute colitis cannot be excluded. No bowel obstruction. Normal appendix. BONES AND SOFT TISSUES:  Unremarkable.     Moderate apparent wall thickening of the entire colon, favor this is due to underdistention. Acute colitis cannot be excluded. Johnsie Kindred, MD 10/02/2022 12:06 AM    Pathology:   Specimens (From admission, onward)      None            Last EKG Result       None            Hospitalist:   Signed by:   Ilda Mori, MD  10/02/2022 1:04 AM  Time spent in evaluating and managing the care of the patient including face-to-face and non-face-to-face: 75 mins    *This note was generated by the Epic EMR system/ Dragon speech recognition and may contain inherent errors or omissions not intended by the user. Grammatical errors, random word  insertions, deletions, pronoun errors and incomplete sentences are occasional consequences of this technology due to software limitations. Not all errors are caught or corrected. If there are questions or concerns about the content of this note or information contained within the body of this dictation they should be addressed directly with the author for clarification.

## 2022-10-02 NOTE — UM Notes (Signed)
Initial ED and IP review for 7/7 and 7/8:    ADMIT TO INPATIENT (Order #[id number]) on 10/02/22       PATIENT NAME: Judy Freeman, Judy Freeman   DOB: September 19, 1975     Reason for Admission:  47 Year old female presents to [facility] ER on 7/7 with abdominal pain and emesis.      PMH:  Past Medical History:   Diagnosis Date    Arthritis     lower back    Asthma     Breast lump 02/22/2017    painful lump left breast    Bulging lumbar disc     Calcific tendinitis     Disorder of thyroid     Per pt, doctor said no longer hypothyroid    MS (multiple sclerosis)     Pneumonia 2011, 2012    Sepsis 2011       ROS:  Gastrointestinal:  Positive for abdominal pain, diarrhea, nausea and vomiting.     Vital Signs:      10/01/22 2222 10/01/22 2308   BP: 131/81 132/87   Pulse: 94 85   Resp: 22 (!) 25   Temp: 97.1 F (36.2 C)     TempSrc: Temporal     SpO2: 100% 100%   Weight: 84.6 kg (186 lb 8.2 oz)     Height: 1.676 m (5\' 6" )         Labs:   Latest Reference Range & Units 10/01/22 22:35   WBC 3.10 - 9.50 x10 3/uL 3.78   Hemoglobin 11.4 - 14.8 g/dL 40.9 (L)   Hematocrit 81.1 - 43.7 % 34.0 (L)   Platelet Count 142 - 346 x10 3/uL 299   RBC 3.90 - 5.10 x10 6/uL 4.18   MCV 78.0 - 96.0 fL 81.3   MCH 25.1 - 33.5 pg 27.0   MCHC 31.5 - 35.8 g/dL 91.4   RDW 11 - 15 % 14   MPV 8.9 - 12.5 fL 10.9   Instrument Absolute Neutrophil Count 1.10 - 6.33 x10 3/uL 2.69   Neutrophils Not Established % 71.1   Lymphocytes Automated Not Established % 19.0   Lymphocytes Absolute Automated 0.42 - 3.22 x10 3/uL 0.72   Monocytes Not Established % 6.1   Monocytes Absolute Automated 0.21 - 0.85 x10 3/uL 0.23   Eosinophils Automated Not Established % 2.4   Eosinophils Absolute Automated 0.00 - 0.44 x10 3/uL 0.09   Basophils Automated Not Established % 1.1   Basophils Absolute Automated 0.00 - 0.08 x10 3/uL 0.04   Immature Granulocytes Not Established % 0.3   Immature Granulocytes Absolute 0.00 - 0.07 x10 3/uL 0.01   Nucleated RBC <=0.0 /100 WBC 0.0   Neutrophils Absolute 1.10 - 6.33  x10 3/uL 2.69   Nucleated RBC Absolute <=0.00 x10 3/uL 0.00   Glucose 70 - 100 mg/dL 782 (H)   BUN 7 - 21 mg/dL 6 (L)   Creatinine 0.4 - 1.0 mg/dL 0.7   Sodium 956 - 213 mEq/L 136   Potassium 3.5 - 5.3 mEq/L 4.0   Chloride 99 - 111 mEq/L 104   CO2 17 - 29 mEq/L 18   Calcium 8.5 - 10.5 mg/dL 9.8   Anion Gap 5.0 - 08.6  14.0   EGFR >=60.0 mL/min/1.73 m2 >60.0   Magnesium 1.6 - 2.6 mg/dL 1.8   Whole Blood Lactate 0.2 - 2.0 mmol/L 3.1 (H)   AST 5 - 41 U/L 21   ALT 0 - 55 U/L 12   Alkaline Phosphatase 37 -  117 U/L 63   Albumin 3.5 - 5.0 g/dL 4.4   Protein Total 6.0 - 8.3 g/dL 8.8 (H)   Globulin 2.0 - 3.6 g/dL 4.4 (H)   Albumin/Globulin Ratio 0.9 - 2.2  1.0   Bilirubin Total 0.2 - 1.2 mg/dL 0.7   Lipase 8 - 78 U/L 19   BHCG Qualitative Negative  Negative   PT 10.1 - 12.9 sec 11.3   PT INR 0.9 - 1.1  1.0   (L): Data is abnormally low  (H): Data is abnormally high    Imaging:   CT Abd/Pelvis W IV Contrast-       IMPRESSION:      Moderate apparent wall thickening of the entire colon, favor this is due to  underdistention. Acute colitis cannot be excluded.     Plan of Care:  .  Nausea vomiting diarrhea associated with elevated lactic acid with tachycardia suspect SIRS/septic, immunocompromised patient due to MS treatment  Due to acute colitis CT abdomen pelvis shows evidence of wall thickening of entire colon  No recent travel no recent antibiotic use no sick contact  Creatinine normal platelet count normal  Associated with hemoconcentration with elevated serum total protein from dehydration  N.p.o. IV fluid repeat lactic acid continue IV ceftriaxone and Flagyl  Patient had similar symptoms 2 years ago GI Dr. Mliss Sax consult in a.m.  2.  MS on Kesiimpta monthly last dose 1 month ago  Chronic pain syndrome from MS on hydrocodone at home we will continue     Patient has BMI=Body mass index is 30.1 kg/m.  Diagnosis: Obesity based on BMI criteria          Anemia Diagnosis: Unspecified Anemia (currently unable to determine  type)  DVT Prophylaxis:Lovenox  Central Line/Foley Catheter/PICC line status: None  Code Status: Full code  Disposition:home  Type of Admission:Inpatient  Estimated Length of Stay (including stay in the ER receiving treatment): 2 to 3 days  Milestones required for discharge:as above    Scheduled Meds:  Current Facility-Administered Medications   Medication Dose Route Frequency    cefTRIAXone  1 g Intravenous Q24H    enoxaparin  40 mg Subcutaneous Daily    metroNIDAZOLE  500 mg Intravenous Q8H     Continuous Infusions:   sodium chloride 125 mL/hr at 10/02/22 0241     PRN Meds:.acetaminophen, benzocaine-menthol, benzonatate, carboxymethylcellulose sodium, dextrose **OR** dextrose **OR** dextrose **OR** glucagon (rDNA), HYDROcodone-acetaminophen, magnesium sulfate, melatonin, morphine, naloxone, ondansetron, potassium & sodium phosphates, potassium chloride **AND** potassium chloride, saline      UTILIZATION REVIEW CONTACT: Gae Gallop RN, BSN  Utilization Review   [facility]  [address]  [address], [address]  [place], [place] [address]  Phone: [phone] (Voice Mail Only)  Email: [email or web address]   NPI:   [id number]  Tax ID:  [id number]         NOTES TO REVIEWER:    This clinical review is based on/compiled from documentation provided by the treatment team within the patient's medical record.

## 2022-10-02 NOTE — Progress Notes (Signed)
47 year old female admitted from ER with c/o nausea, vomiting, diarrhea. Denies abdominal pain, but states does have neck and back pain related to her MS. IV fluids and antibiotics infusing as ordered.       States that she is feeling a lot stronger now. Was able to stand at bedside and pivot to the bedside commode.       Is in sinus rhythm. Heart rate in the 60's.        Potassium and Magnesium replaced per the Electrolyte replacement orders.

## 2022-10-02 NOTE — Plan of Care (Signed)
Problem: Pain interferes with ability to perform ADL  Goal: Pain at adequate level as identified by patient  Outcome: Progressing  Flowsheets (Taken 10/02/2022 0426)  Pain at adequate level as identified by patient:   Identify patient comfort function goal   Assess for risk of opioid induced respiratory depression, including snoring/sleep apnea. Alert healthcare team of risk factors identified.   Assess pain on admission, during daily assessment and/or before any "as needed" intervention(s)   Reassess pain within 30-60 minutes of any procedure/intervention, per Pain Assessment, Intervention, Reassessment (AIR) Cycle   Evaluate if patient comfort function goal is met   Offer non-pharmacological pain management interventions   Evaluate patient's satisfaction with pain management progress     Problem: Safety  Goal: Patient will be free from injury during hospitalization  Outcome: Progressing  Flowsheets (Taken 10/02/2022 0426)  Patient will be free from injury during hospitalization:   Assess patient's risk for falls and implement fall prevention plan of care per policy   Provide and maintain safe environment   Use appropriate transfer methods   Ensure appropriate safety devices are available at the bedside   Include patient/ family/ care giver in decisions related to safety   Hourly rounding  Note: Bed alarm on. Reminded to call for assist out of bed.     Problem: Fluid and Electrolyte Imbalance/ Endocrine  Goal: Fluid and electrolyte balance are achieved/maintained  Outcome: Progressing  Flowsheets (Taken 10/02/2022 0426)  Fluid and electrolyte balance are achieved/maintained:   Monitor/assess lab values and report abnormal values   Assess and reassess fluid and electrolyte status   Observe for cardiac arrhythmias   Monitor for muscle weakness  Note: IV fluids as ordered

## 2022-10-02 NOTE — Progress Notes (Signed)
47 year old woman MS and recurrent nausea and vomiting, comes in with c/o intractable N/V.   CT c/w diffused colitis   Patient seen and examined at bedside   Endorsed feeling cold (similar to baseline), in-room thermometer adjusted per patient's request   Continue IVF and antibiotics   Will start GI soft diet     Kavin Leech MD MBA   6:18 PM, 10/02/22

## 2022-10-02 NOTE — ED to IP RN Note (Signed)
[facility]  ED NURSING NOTE FOR THE RECEIVING INPATIENT NURSE   ED NURSE Shawnie Pons 0272   ED CHARGE RN Lanora Manis   ADMISSION INFORMATION   Judy Freeman is a 47 y.o. female admitted with an ED diagnosis of:    1. Colitis         Isolation: None   Allergies: Penicillins   Holding Orders confirmed? No   Belongings Documented? No   Home medications sent to pharmacy confirmed? No   NURSING CARE   Patient Comes From:   Mental Status: Home Independent  alert and oriented   ADL: Independent with all ADLs   Ambulation: mild difficulty   Pertinent Information  and Safety Concerns:     Broset Violence Risk Level: Low N/a     CT / NIH   CT Head ordered on this patient?  No   NIH/Dysphagia assessment done prior to admission? No   VITAL SIGNS (at the time of this note)      Vitals:    10/02/22 0130   BP: 135/82   Pulse: 73   Resp: 16   Temp:    SpO2: 98%   TEMP-97.1  Pain Score: 6-moderate pain (10/01/22 2222)

## 2022-10-02 NOTE — Plan of Care (Signed)
Problem: Pain interferes with ability to perform ADL  Goal: Pain at adequate level as identified by patient  Outcome: Progressing  Flowsheets (Taken 10/02/2022 0751)  Pain at adequate level as identified by patient:   Identify patient comfort function goal   Assess for risk of opioid induced respiratory depression, including snoring/sleep apnea. Alert healthcare team of risk factors identified.   Assess pain on admission, during daily assessment and/or before any "as needed" intervention(s)   Reassess pain within 30-60 minutes of any procedure/intervention, per Pain Assessment, Intervention, Reassessment (AIR) Cycle   Evaluate if patient comfort function goal is met   Evaluate patient's satisfaction with pain management progress   Offer non-pharmacological pain management interventions   Include patient/patient care companion in decisions related to pain management as needed     Problem: Moderate/High Fall Risk Score >5  Goal: Patient will remain free of falls  Outcome: Progressing  Flowsheets (Taken 10/02/2022 0751)  Moderate Risk (6-13):   MOD-Remain with patient during toileting   MOD-Place bedside commode and assistive devices out of sight when not in use   MOD-Utilize diversion activities   MOD-Perform dangle, stand, walk (DSW) prior to mobilization     Problem: Safety  Goal: Patient will be free from injury during hospitalization  Outcome: Progressing  Flowsheets (Taken 10/02/2022 0751)  Patient will be free from injury during hospitalization:   Assess patient's risk for falls and implement fall prevention plan of care per policy   Use appropriate transfer methods   Provide and maintain safe environment   Ensure appropriate safety devices are available at the bedside   Include patient/ family/ care giver in decisions related to safety   Hourly rounding   Assess for patients risk for elopement and implement Elopement Risk Plan per policy     Problem: Fluid and Electrolyte Imbalance/ Endocrine  Goal: Fluid and  electrolyte balance are achieved/maintained  Outcome: Progressing  Flowsheets (Taken 10/02/2022 0751)  Fluid and electrolyte balance are achieved/maintained:   Monitor/assess lab values and report abnormal values   Assess and reassess fluid and electrolyte status   Monitor for muscle weakness

## 2022-10-02 NOTE — Transition of Care (Signed)
4 eyes in 4 hours pressure injury assessment note:      Completed with: Tara  Unit & Time admitted: 1south 0230             Bony Prominences: Check appropriate box; if wound is present enter wound assessment in LDA     Occiput:                 [x] WNL  []  Wound present  Face:                     [x] WNL  []  Wound present  Ears:                      [x] WNL  []  Wound present  Spine:                    [x] WNL  []  Wound present  Shoulders:             [x] WNL  []  Wound present  Elbows:                  [x] WNL  []  Wound present  Sacrum/coccyx:     [x] WNL  []  Wound present  Ischial Tuberosity:  [x] WNL  []  Wound present  Trochanter/Hip:      [x] WNL  []  Wound present  Knees:                   [x] WNL  []  Wound present  Ankles:                   [x] WNL  []  Wound present  Heels:                    [x] WNL  []  Wound present  Other pressure areas:  []  Wound location       Device related: []  Device name:         LDA completed if wound present: yes/no  Consult WOCN if necessary    Other skin related issues, ie tears, rash, etc, document in Integumentary flowsheet

## 2022-10-03 LAB — STOOL ENTERIC BACTERIA PANEL - SALMONELLA, SHIGELLA, CAMPYLOBACTER AND SHIGA TOXIN, PCR
Stool Campylobacter spp. (jejuni and coli) DNA: NEGATIVE
Stool Salmonella spp. DNA: NEGATIVE
Stool Shiga toxin 1 (stx1)/Shiga toxin 2 (stx2) genes: NEGATIVE
Stool Shigella spp./Enteroinvasive E. coli (EIEC) DNA: NEGATIVE

## 2022-10-03 LAB — STOOL CLOSTRIDIOIDES DIFFICILE TOXIN B, PCR: Stool, C. difficile toxin B gene DNA: NEGATIVE

## 2022-10-03 LAB — LAB USE ONLY - GROUP A STREP, RAPID ANTIGEN: Group A Strep, Rapid Antigen: NEGATIVE

## 2022-10-03 MED ORDER — FLUTICASONE PROPIONATE 50 MCG/ACT NA SUSP
1.0000 | Freq: Every day | NASAL | Status: DC
Start: 2022-10-03 — End: 2022-10-09
  Administered 2022-10-03 – 2022-10-09 (×7): 1 via NASAL
  Filled 2022-10-03: qty 16

## 2022-10-03 NOTE — Progress Notes (Signed)
[facility]ITAL[facility] Progress Note  Patient Info:   Date/Time: 10/03/2022 / 3:51 PM   Admit Date:10/01/2022  Patient Name:Judy Freeman   KVQ:[id number]   PCP: Hurley Cisco, MD  Attending Physician:Cori Henningsen, Thomasene Lot, MD  Assessment/Plan:   47 year old woman with MS, chronic intermittent N/V and diarrhea followed by GI as outpatient, migraine headache presented with c/o  nausea, vomiting and diarrhea. CT suggestive diffused colitis       Acute diffused Colitis  Abdominal pain improving gradually  Continue Rocephin and Flagyl IV  Lactobacillus 1 capsule   Low fiber GI soft diet with advancement as tolerated     Possible URI  Rule out step   Urine Legionella and Strep antigen   Ocean Spray + Flonase     Recurrent intractable nausea, vomiting and diarrhea  Improved.   Continue Zofran IV Q6  IV hydration     Migraine headache   On Nurtec but non formulary   Open to try Fioricet until she is able to have her own medication from home    Generalized weakness   PT/OT evaluation     Multiple sclerosis on monthly Kesimpta    Outpatient follow up with Neurology         DVT Prohylaxis:SEDs   GI Prophylaxis:None   Central Line/Foley Catheter/PICC line status: None   Code Status: Full Code  Disposition:home  Type of Admission:Observation  Expected Date of Discharge: 24 hrs   Milestones required for discharge: Clinical improvement      Hospital Problems:     Active Hospital Problems    Diagnosis    Colitis     Subjective:   Chief Complaint:  Abdominal Pain and Emesis    10/03/22   Review of Systems   Constitutional: Negative.    HENT: Negative.     Eyes: Negative.    Gastrointestinal: Negative.    Genitourinary: Negative.    Musculoskeletal: Negative.    Neurological:  Positive for headaches.   Psychiatric/Behavioral: Negative.         Objective:     Vitals:    10/03/22 0007 10/03/22 0439 10/03/22 0903 10/03/22 1244   BP: 121/74 105/69 113/75 132/84   Pulse: (!) 57 69 63 66   Resp: 16 17 18 18    Temp: 98.8 F (37.1 C) 98.8 F (37.1  C) 98.1 F (36.7 C) 98.4 F (36.9 C)   TempSrc: Oral Oral Oral Oral   SpO2: 97% 97% 96% 99%   Weight:       Height:         Physical Exam:   Physical Exam  HENT:      Head: Normocephalic and atraumatic.      Nose: Nose normal.      Mouth/Throat:      Mouth: Mucous membranes are moist.      Pharynx: Oropharynx is clear.   Eyes:      Extraocular Movements: Extraocular movements intact.      Conjunctiva/sclera: Conjunctivae normal.      Pupils: Pupils are equal, round, and reactive to light.   Cardiovascular:      Pulses: Normal pulses.      Comments: S1, S2 NL   Pulmonary:      Effort: Pulmonary effort is normal.   Abdominal:      General: Bowel sounds are normal.      Palpations: Abdomen is soft.   Musculoskeletal:         General: Normal range of motion.  Cervical back: Neck supple.      Right lower leg: No edema.      Left lower leg: No edema.   Skin:     General: Skin is dry.      Capillary Refill: Capillary refill takes less than 2 seconds.   Neurological:      General: No focal deficit present.      Mental Status: She is alert and oriented to person, place, and time.   Psychiatric:         Mood and Affect: Mood normal.         Behavior: Behavior normal.         Thought Content: Thought content normal.         Judgment: Judgment normal.       Current Medications      Scheduled Meds: PRN Meds:    cefTRIAXone, 1 g, Intravenous, Q24H  enoxaparin, 40 mg, Subcutaneous, Daily  fluticasone, 1 spray, Each Nare, Daily  metroNIDAZOLE, 500 mg, Intravenous, Q8H        Continuous Infusions:   sodium chloride 125 mL/hr at 10/03/22 1416    acetaminophen, 650 mg, Q6H PRN  benzocaine-menthol, 1 lozenge, Q2H PRN  benzonatate, 100 mg, TID PRN  carboxymethylcellulose sodium, 1 drop, TID PRN  dextrose, 15 g of glucose, PRN   Or  dextrose, 12.5 g, PRN   Or  dextrose, 12.5 g, PRN   Or  glucagon (rDNA), 1 mg, PRN  HYDROcodone-acetaminophen, 1 tablet, Q6H PRN  magnesium sulfate, 1 g, PRN  melatonin, 3 mg, QHS PRN  morphine, 2 mg,  Q4H PRN  naloxone, 0.2 mg, PRN  ondansetron, 4 mg, Q6H PRN  potassium & sodium phosphates, 2 packet, PRN  potassium chloride, 0-40 mEq, PRN   And  potassium chloride, 10 mEq, PRN  saline, 2 spray, Q4H PRN          Results of Labs/imaging   Labs and radiology reports have been reviewed.    Hospitalist   Signed by:   Kavin Leech, MD  10/03/2022 3:51 PM

## 2022-10-03 NOTE — Plan of Care (Signed)
Problem: Pain interferes with ability to perform ADL  Goal: Pain at adequate level as identified by patient  Flowsheets (Taken 10/03/2022 2239)  Pain at adequate level as identified by patient:   Identify patient comfort function goal   Assess pain on admission, during daily assessment and/or before any "as needed" intervention(s)   Assess for risk of opioid induced respiratory depression, including snoring/sleep apnea. Alert healthcare team of risk factors identified.   Reassess pain within 30-60 minutes of any procedure/intervention, per Pain Assessment, Intervention, Reassessment (AIR) Cycle   Evaluate if patient comfort function goal is met   Evaluate patient's satisfaction with pain management progress     Problem: Safety  Goal: Patient will be free from injury during hospitalization  Flowsheets (Taken 10/03/2022 2239)  Patient will be free from injury during hospitalization:   Assess patient's risk for falls and implement fall prevention plan of care per policy   Use appropriate transfer methods   Ensure appropriate safety devices are available at the bedside   Provide and maintain safe environment   Hourly rounding   Include patient/ family/ care giver in decisions related to safety   Assess for patients risk for elopement and implement Elopement Risk Plan per policy     Problem: Fluid and Electrolyte Imbalance/ Endocrine  Goal: Fluid and electrolyte balance are achieved/maintained  Flowsheets (Taken 10/03/2022 2239)  Fluid and electrolyte balance are achieved/maintained:   Monitor/assess lab values and report abnormal values   Assess and reassess fluid and electrolyte status   Observe for cardiac arrhythmias   Monitor for muscle weakness

## 2022-10-03 NOTE — Plan of Care (Signed)
Problem: Pain interferes with ability to perform ADL  Goal: Pain at adequate level as identified by patient  Outcome: Progressing  Flowsheets (Taken 10/03/2022 1332)  Pain at adequate level as identified by patient:   Identify patient comfort function goal   Assess for risk of opioid induced respiratory depression, including snoring/sleep apnea. Alert healthcare team of risk factors identified.   Assess pain on admission, during daily assessment and/or before any "as needed" intervention(s)   Reassess pain within 30-60 minutes of any procedure/intervention, per Pain Assessment, Intervention, Reassessment (AIR) Cycle   Evaluate if patient comfort function goal is met   Evaluate patient's satisfaction with pain management progress   Offer non-pharmacological pain management interventions   Include patient/patient care companion in decisions related to pain management as needed     Problem: Safety  Goal: Patient will be free from injury during hospitalization  Outcome: Progressing  Flowsheets (Taken 10/03/2022 1332)  Patient will be free from injury during hospitalization:   Assess patient's risk for falls and implement fall prevention plan of care per policy   Use appropriate transfer methods   Provide and maintain safe environment   Ensure appropriate safety devices are available at the bedside   Include patient/ family/ care giver in decisions related to safety   Hourly rounding   Assess for patients risk for elopement and implement Elopement Risk Plan per policy     Problem: Fluid and Electrolyte Imbalance/ Endocrine  Goal: Fluid and electrolyte balance are achieved/maintained  Outcome: Progressing  Flowsheets (Taken 10/03/2022 1332)  Fluid and electrolyte balance are achieved/maintained:   Monitor/assess lab values and report abnormal values   Assess and reassess fluid and electrolyte status   Monitor for muscle weakness

## 2022-10-04 DIAGNOSIS — J029 Acute pharyngitis, unspecified: Secondary | ICD-10-CM

## 2022-10-04 DIAGNOSIS — G43809 Other migraine, not intractable, without status migrainosus: Secondary | ICD-10-CM

## 2022-10-04 DIAGNOSIS — K529 Noninfective gastroenteritis and colitis, unspecified: Principal | ICD-10-CM

## 2022-10-04 DIAGNOSIS — R112 Nausea with vomiting, unspecified: Secondary | ICD-10-CM

## 2022-10-04 LAB — STOOL FOR WBC
Eosinophils Wright Stain: NONE SEEN
Lymphocytes, Wright Stain: NONE SEEN
Neutrophils Wright Stain: NONE SEEN
RBC Wright Stain: NONE SEEN

## 2022-10-04 LAB — STOOL CRYPTOSPORIDIUM PARVUM AND GIARDIA LAMBLIA, ANTIGEN
Stool Cryptosporidium parvum antigen: NEGATIVE
Stool Giardia lamblia antigen: NEGATIVE

## 2022-10-04 LAB — LAB USE ONLY - ESWAB HOLD TUBE

## 2022-10-04 LAB — C-REACTIVE PROTEIN: C-Reactive Protein: <0.1 mg/dL (ref 0.0–1.1)

## 2022-10-04 LAB — URINE LEGIONELLA PNEUMOPHILA SEROGROUP 1 ANTIGEN: Urine Legionella pneumophila serogroup 1 Antigen: NEGATIVE

## 2022-10-04 MED ORDER — RISAQUAD PO CAPS
1.0000 | ORAL_CAPSULE | Freq: Every day | ORAL | Status: DC
Start: 2022-10-04 — End: 2022-10-09
  Administered 2022-10-04 – 2022-10-09 (×6): 1 via ORAL
  Filled 2022-10-04 (×4): qty 1

## 2022-10-04 NOTE — Progress Notes (Signed)
Initial Case Management Assessment and Discharge Planning  [facility]   Patient Name: Judy Freeman, PRIMEAU   Date of Birth 1975-09-09   Attending Physician: Kavin Leech, MD   Primary Care Physician: Hurley Cisco, MD   Length of Stay 0   Reason for Consult / Chief Complaint 47 yo F admitted w/ colitis.        Situation   Admission DX:   1. Dehydration    2. Colitis    3. Elevated lactic acid level        A/O Status: X 3    Patient admitted from: ER  Admission Status: observation    Health Care Agent: Self     Background     Advanced directive:   Not Received    Code Status:   Full Code     Residence: Apartment    Patient Contact:   [phone] (home)     [phone] (mobile)     Emergency contact: Extended Emergency Contact Information  Primary Emergency Contact: Provens,Shelbi  Address: [address]            [address], [place] [address] [place]  Home Phone: [phone]  Mobile Phone: [phone]  Relation: Spouse     PCP: Hurley Cisco, MD  Pharmacy:   [phone] [place], [place] - [address]  [address] [place] [address]  Phone: [phone] Fax: [phone]    [id number] [place], [place] - [address] AT [facility] [address]  [address]  [place] [address]  Phone: [phone] Fax: [phone]    [id number] [place], [place] - [address]  [address]  [place] [address]  Phone: [phone] Fax: [phone]    [facility] [place], [place] - [address]  [address] [place] [address]  Phone: [phone] Fax: [phone]    Prescription Coverage: Yes    ADL/IADL's: Independent  Previous Level of function: 7 Independent     DME: None    Home Health: The patient is not currently receiving home health services.    Previous SNF/AR: none    Transport for discharge? Mode of transportation: Sales executive - Family/Friend  to drive patient   Assessment   Spoke with pts s/o, Shelbi, via telephone for CM routine screening, assessment and care coordination for discharge planning, demographics & pharmacy verified, pt did not answer room phone, VM left for pt. Pt lives with her spouse, Lavera Guise & their children, in a 3rd floor walk up apartment. PTA pt was independent with ADLs and IADLs. Pts PCP is Hurley Cisco, MD, they were last seen by this office aprox 1 year ago. Pts spouse will provide transportation at time of Fults. All questions answered prior to ending call w/ Shelbi. The above information has been passed onto pts on-site CM.   BARRIERS TO DISCHARGE: none at this time  Financial Resource Strain:    - How hard is it for you to pay for the very basics like food, housing, medical care and heating? Not hard  Housing Stability:   - In the last 12 months, was there a time when you were not able to pay mortgage or rent on time? No   - In the last 12 months, how many places have you lived? 1   -  In the last 12 months, was there a time when you did not have a steady place to sleep or slept in a shelter (including now)? No  Transportation Needs:   - In the past 12 months, has lack of transportation kept you from medical appointments or from getting medications? No   - In the past 12 months, has lack of transportation kept you from meetings, work or from getting things needed for daily living? No   Recommendation   [place] home, no CM needs anticipated. CM will continue to follow for hospital discharge needs.      Steele Berg, BSN RN  Case Management  [facility]  [address], [address]  [place], [place] [address]  T [phone]  F [phone]

## 2022-10-04 NOTE — Progress Notes (Signed)
[facility] Progress Note  Patient Info:   Date/Time: 10/04/2022 / 2:42 PM   Admit Date:10/01/2022  Patient Name:Judy Freeman   UJW:[id number]   PCP: Hurley Cisco, MD  Attending Physician:Arlenis Blaydes, Thomasene Lot, MD  Assessment/Plan:   47 year old woman with MS, chronic intermittent N/V and diarrhea followed by GI as outpatient, migraine headache presented with c/o  nausea, vomiting and diarrhea. CT suggestive diffused colitis       Acute diffused Colitis  Abdominal pain improving gradually however unable to tolerate oral intake due to intractable nausea and vomiting  GI consulted-Dr. Mliss Sax  Continue Rocephin and Flagyl IV  Lactobacillus 1 capsule   Low fiber GI soft diet with advancement as tolerated     Possible viral URI  Rule out step   Urine Legionella and Strep antigen negative  Ocean Spray + Flonase     Recurrent intractable nausea, vomiting and diarrhea  Improved.   Continue Zofran IV Q6  IV hydration     Migraine headache   On Nurtec but non formulary   Continue Fioricet    Generalized weakness   PT/OT evaluation     Multiple sclerosis on monthly Kesimpta    Outpatient follow up with Neurology         Medical necessity  Patient continues to have intractable nausea, vomiting, abdominal pain and diarrhea when diet was restarted.  The plan will be to get the patient to a point where she can tolerate oral intake.  GI consult has been requested to assist.  Continue IV fluids and antibiotics.  We will follow-up.        DVT Prohylaxis:SEDs   GI Prophylaxis:None   Central Line/Foley Catheter/PICC line status: None   Code Status: Full Code  Disposition:home  Type of Admission:Observation  Expected Date of Discharge: TBD  Milestones required for discharge: Clinical improvement      Hospital Problems:     Active Hospital Problems    Diagnosis    Colitis     Subjective:   Chief Complaint:  Abdominal Pain and Emesis    10/04/22   Review of Systems   Constitutional: Negative.    HENT: Negative.     Eyes: Negative.     Gastrointestinal:  Positive for diarrhea, nausea and vomiting.   Genitourinary: Negative.    Musculoskeletal: Negative.    Neurological:  Positive for headaches.   Psychiatric/Behavioral: Negative.         Objective:     Vitals:    10/04/22 0017 10/04/22 0456 10/04/22 0817 10/04/22 1236   BP: (!) 151/99 109/71 109/72 143/87   Pulse: 63 64 73 62   Resp: 16 16 13 16    Temp: 98.2 F (36.8 C) 98.4 F (36.9 C) 98.8 F (37.1 C) 98.6 F (37 C)   TempSrc: Oral Oral Oral Oral   SpO2: 97% 96% 96% 99%   Weight:       Height:         Physical Exam:   Physical Exam  HENT:      Head: Normocephalic and atraumatic.      Nose: Nose normal.      Mouth/Throat:      Mouth: Mucous membranes are moist.      Pharynx: Oropharynx is clear.   Eyes:      Extraocular Movements: Extraocular movements intact.      Conjunctiva/sclera: Conjunctivae normal.      Pupils: Pupils are equal, round, and reactive to light.   Cardiovascular:  Pulses: Normal pulses.      Comments: S1, S2 NL   Pulmonary:      Effort: Pulmonary effort is normal.   Abdominal:      General: Bowel sounds are normal.      Palpations: Abdomen is soft.   Musculoskeletal:         General: Normal range of motion.      Cervical back: Neck supple.      Right lower leg: No edema.      Left lower leg: No edema.   Skin:     General: Skin is dry.      Capillary Refill: Capillary refill takes less than 2 seconds.   Neurological:      General: No focal deficit present.      Mental Status: She is alert and oriented to person, place, and time.   Psychiatric:         Mood and Affect: Mood normal.         Behavior: Behavior normal.         Thought Content: Thought content normal.         Judgment: Judgment normal.       Current Medications      Scheduled Meds: PRN Meds:    cefTRIAXone, 1 g, Intravenous, Q24H  enoxaparin, 40 mg, Subcutaneous, Daily  fluticasone, 1 spray, Each Nare, Daily  lactobacillus/streptococcus, 1 capsule, Oral, Daily  metroNIDAZOLE, 500 mg, Intravenous,  Q8H        Continuous Infusions:   sodium chloride 125 mL/hr at 10/04/22 0837    acetaminophen, 650 mg, Q6H PRN  benzocaine-menthol, 1 lozenge, Q2H PRN  benzonatate, 100 mg, TID PRN  carboxymethylcellulose sodium, 1 drop, TID PRN  dextrose, 15 g of glucose, PRN   Or  dextrose, 12.5 g, PRN   Or  dextrose, 12.5 g, PRN   Or  glucagon (rDNA), 1 mg, PRN  HYDROcodone-acetaminophen, 1 tablet, Q6H PRN  magnesium sulfate, 1 g, PRN  melatonin, 3 mg, QHS PRN  morphine, 2 mg, Q4H PRN  naloxone, 0.2 mg, PRN  ondansetron, 4 mg, Q6H PRN  potassium & sodium phosphates, 2 packet, PRN  potassium chloride, 0-40 mEq, PRN   And  potassium chloride, 10 mEq, PRN  saline, 2 spray, Q4H PRN          Results of Labs/imaging   Labs and radiology reports have been reviewed.    Hospitalist   Signed by:   Kavin Leech, MD  10/04/2022 2:42 PM

## 2022-10-04 NOTE — Consults (Signed)
GASTROENTEROLOGY CONSULT  Date Time: 10/04/22 1:44 PM  Patient Name: Judy Freeman  Attending Physician: Kavin Leech, MD    Assessment:   1) Abdominal pain- likely secondary to infectious colitis. Stool studies negative to date, but still having loose stool. She had unremarkable EGD and colonoscopy w/ random biopsies in 2021.  She is on MS treatment which may be a immunologic predisposition to infection.  Plan:   1) check fecal elastase, wbc, fat,  crypto and giardia and osmolality.  2) flex sig w/ biopsy tomorrow  D/w patient plan and her spousea      Problem List:     Patient Active Problem List    Diagnosis Date Noted    Colitis 10/02/2022    Multiple sclerosis exacerbation 01/11/2019    Asthma 01/11/2019    Hypothyroid 01/11/2019     History of Present Illness:   Judy Freeman is a 47 y.o. female presents w/ abdominal pain diffusely , nausea and vomitting.  Ct revealed diffuse colonic wall thickening vs. Underdistention. She is on Kesimpta for MS.   Had Chills, but no fever.   Past Medical History:     Past Medical History:   Diagnosis Date    Arthritis     lower back    Asthma     Breast lump 02/22/2017    painful lump left breast    Bulging lumbar disc     Calcific tendinitis     Disorder of thyroid     Per pt, doctor said no longer hypothyroid    MS (multiple sclerosis)     Pneumonia 2011, 2012    Sepsis 2011     Past Surgical History:     Past Surgical History:   Procedure Laterality Date    CESAREAN SECTION      CHOLECYSTECTOMY      DEBRIDEMENT & IRRIGATION, ABSCESS  2015     Family History:     Family History   Problem Relation Age of Onset    Cancer Father     Diabetes Maternal Aunt     Cancer Maternal Aunt     Breast cancer Maternal Aunt      Social History:     Social History     Socioeconomic History    Marital status: Married     Spouse name: Not on file    Number of children: Not on file    Years of education: Not on file    Highest education level: Not on file   Occupational History    Not  on file   Tobacco Use    Smoking status: Former     Types: Cigarettes    Smokeless tobacco: Never   Vaping Use    Vaping status: Never Used   Substance and Sexual Activity    Alcohol use: Not Currently     Comment: occasionally    Drug use: Yes     Types: Marijuana     Comment: for her MS- medical marijuana, occasional    Sexual activity: Yes   Other Topics Concern    Not on file   Social History Narrative    Not on file     Social Determinants of Health     Financial Resource Strain: Low Risk  (10/04/2022)    Overall Financial Resource Strain (CARDIA)     Difficulty of Paying Living Expenses: Not very hard   Food Insecurity: No Food Insecurity (10/02/2022)    Hunger Vital Sign  Worried About Programme researcher, broadcasting/film/video in the Last Year: Never true     Ran Out of Food in the Last Year: Never true   Transportation Needs: No Transportation Needs (10/02/2022)    PRAPARE - Therapist, art (Medical): No     Lack of Transportation (Non-Medical): No   Physical Activity: Inactive (06/24/2020)    Exercise Vital Sign     Days of Exercise per Week: 0 days     Minutes of Exercise per Session: 0 min   Stress: Stress Concern Present (06/24/2020)    [employer] of Occupational Health - Occupational Stress Questionnaire     Feeling of Stress : Rather much   Social Connections: Unknown (06/24/2020)    Social Connection and Isolation Panel [NHANES]     Frequency of Communication with Friends and Family: Patient declined     Frequency of Social Gatherings with Friends and Family: Patient declined     Attends Religious Services: Patient declined     Database administrator or Organizations: No     Attends Banker Meetings: Never     Marital Status: Married   Catering manager Violence: Not At Risk (10/02/2022)    Humiliation, Afraid, Rape, and Kick questionnaire     Fear of Current or Ex-Partner: No     Emotionally Abused: No     Physically Abused: No     Sexually Abused: No   Housing Stability: Low Risk   (10/04/2022)    Housing Stability Vital Sign     Unable to Pay for Housing in the Last Year: No     Number of Places Lived in the Last Year: 1     Unstable Housing in the Last Year: No     Allergies:     Allergies   Allergen Reactions    Penicillins Hives     Medications:     Current Discharge Medication List        CONTINUE these medications which have NOT CHANGED    Details   HYDROcodone-acetaminophen (NORCO) 5-325 MG per tablet Take 1 tablet by mouth every 6 (six) hours as needed      Kesimpta 20 MG/0.4ML Solution Auto-injector INJECT 1 PEN UNDER THE SKIN EVERY MONTH  Qty: 1 each, Refills: 11    Associated Diagnoses: Multiple sclerosis; Vitamin D deficiency; Paresthesia; Immunosuppression due to drug therapy      loperamide (IMODIUM A-D) 2 MG tablet Take 1 tablet (2 mg) by mouth as needed for Diarrhea      ondansetron (ZOFRAN-ODT) 4 MG disintegrating tablet Take 1 tablet (4 mg) by mouth every 6 (six) hours as needed for Nausea  Qty: 8 tablet, Refills: 0      pregabalin (LYRICA) 75 MG capsule Take 2 capsules (150 mg) by mouth nightly Taking 150mg  qhs      Rimegepant Sulfate (NURTEC PO) Take by mouth as needed For migraines      Armodafinil (NUVIGIL) 250 MG tablet Take 1 tablet (250 mg) by mouth every morning  Qty: 30 tablet, Refills: 0    Comments: This request is for a new prescription for a controlled substance as required by Federal/State law..  Associated Diagnoses: Multiple sclerosis      Cholecalciferol (Vitamin D3) 250 MCG (10000 UT) capsule Take 20,000 Units by mouth daily  Qty: 90 capsule, Refills: 3    Associated Diagnoses: Multiple sclerosis; Vitamin D deficiency; Paresthesia; Immunosuppression due to drug therapy      diazePAM (  VALIUM) 2 MG tablet Take 2 mg by mouth every 12 (twelve) hours as needed      dicyclomine (BENTYL) 20 MG tablet Take 1 tablet (20 mg total) by mouth every 8 (eight) hours as needed (abdominal pain)  Qty: 15 tablet, Refills: 0      Levothyroxine Sodium 50 MCG Cap Take 50 mcg by  mouth daily         oxybutynin (DITROPAN-XL) 10 MG 24 hr tablet Take 1 tablet (10 mg) by mouth daily  Qty: 90 tablet, Refills: 3      Solriamfetol HCl (Sunosi) 150 MG Tab Take by mouth daily           Physical Exam:     Vitals:    10/04/22 1236   BP: 143/87   Pulse: 62   Resp: 16   Temp: 98.6 F (37 C)   SpO2: 99%     Chest:  Clear with no abnormal breath sounds  Heart:  Regular rhythm, no significant murmurs.  Abdomen:  Soft, nontender, no mass or hepatosplenomegaly.    Labs:     Rads:     Signed by: Philbert Riser, MD

## 2022-10-04 NOTE — OT Eval Note (Signed)
Occupational Therapy Evaluation  Patient: Jerita M Dortch    S180/S180.A  Discharge Recommendations:   Based on today's session: Acute Rehab   Patient anticipated to benefit from and to be able to engage in 3 hours of therapy a day for 5 days a week.     If pt must return home, then the patient will need home health services, increase supervision , assistance with mobility, assistance with ADL's, and assistance with IADL's  DME if pt returns home, DME Recommended for Discharge: Front wheel walker;Reacher;Tub transfer bench;Other (Comment) (Raised toilet seat with handles)      Unit: [facility]  Bed: S180/S180.A      Assessment:   Hanaan M Nash is a 47 y.o. female admitted 10/01/2022. Pt's presents with:  Assessment: decreased strength;balance deficits;decreased independence with ADLs;decreased safety awareness;decreased independence with IADLs;decreased endurance/activity tolerance. Pt would continue to benefit from OT to address these deficits and increase functional independence.      Therapy Diagnosis: generalized weakness, decreased functional mobility , decreased independence with ADL's, and decreased endurance/ activity engagement due to generalized weakness. Without therapy interventions, patient is at risk for falls, dependence on caregivers for mobility, dependence on caregivers for ADL's, and failure to return to PLOF.    Rehabilitation Potential: Prognosis: Good;With continued OT s/p acute discharge;With family    Plan:   OT Frequency Recommended: 3-4x/wk   Treatment Interventions: ADL retraining;Functional transfer training;Endurance training;UE strengthening/ROM;Patient/Family training;Equipment eval/education;Neuro muscular reeducation     Patient Goal  Patient Goal: "To be independent".    Risks/Benefits/POC Discussed with Pt/Family: With patient    Goals:   Goal Formulation: Patient  Time For Goal Achievement: 5 visits  ADL Goals  Patient will dress lower body:  Independent;2 visits  Patient will toilet: Modified Independent;2 visits  Mobility and Transfer Goals  Pt will perform functional transfers: Independent;with rolling walker;3 visits  Neuro Re-Ed Goals  Pt will perform dynamic standing balance: Independent;for 10 minutes;to complete standing ADLs safely;3 visits  Musculoskeletal Goals  Pt will demonstrate increase of strength: to 4/5;to increase ability to complete ADLs;5 visits                       S180/S180.A    OT Received On: 10/04/22  Start Time: 1140  Stop Time: 1212  Time Calculation (min): 32 min  OT Visit Number: 1    Consult received for Xochilt M Fleites for OT Evaluation and Treatment.  Patient's medical condition is appropriate for Occupational therapy intervention at this time.    Precautions and Contraindications:   Precautions  Weight Bearing Status: no restrictions  Fall Risks: High;Impaired balance/gait;Muscle weakness  Other Precautions: h/o MS      Medical Diagnosis: Colitis [K52.9]    History of Present Illness: Yesmin M Keleher is a 47 y.o. female admitted on 10/01/2022 with c/o intermitent N&V followed by diarrhea.  (Per H&P) : 47 year old woman with MS, chronic intermittent N/V and diarrhea followed by GI as outpatient, migraine headache presented with c/o nausea, vomiting and diarrhea.    came with   the chief complaint of Abdominal Pain and Emesis  47 year old female with medical history of MS chronic pain syndrome.  Patient presented to ED with multiple episode of nausea and vomiting associated with loose watery diarrhea for last 1 day.    Patient Active Problem List   Diagnosis    Multiple sclerosis exacerbation    Asthma    Hypothyroid  Colitis        Past Medical/Surgical History:  Past Medical History:   Diagnosis Date    Arthritis     lower back    Asthma     Breast lump 02/22/2017    painful lump left breast    Bulging lumbar disc     Calcific tendinitis     Disorder of thyroid     Per pt, doctor said no longer hypothyroid    MS  (multiple sclerosis)     Pneumonia 2011, 2012    Sepsis 2011      Past Surgical History:   Procedure Laterality Date    CESAREAN SECTION      CHOLECYSTECTOMY      DEBRIDEMENT & IRRIGATION, ABSCESS  2015         X-Rays/Tests/Labs:      Social History:  Prior Level of Function  Prior level of function: Independent with ADLs, Ambulates independently  Baseline Activity Level: Community ambulation  Driving: does not drive  Cooking: light meal prep  Employment: Retired  Development worker, international aid  Living Arrangements: Spouse/significant other  Type of Home: Apartment  Home Layout: Performs ADL's on one level, Stairs to enter with rails (add number in comment) (2 flight of stairs)  Bathroom Shower/Tub: Medical sales representative: Standard  Bathroom Accessibility: Accessible  Home Living - Notes / Comments: Pe rpt her family is able to assist as needed.      Subjective:   Patient is agreeable to participation in the therapy session. Nursing clears patient for therapy.  Subjective: "I am feeling very weak".  Pain Assessment  Pain Assessment: Numeric Scale (0-10)  Pain Score: 5-moderate pain  POSS Score: Awake and Alert  Pain Location: Generalized  Effect of Pain on Daily Activities: mild  Pain Intervention(s): Repositioned;Ambulation/increased activity.        Objective:   Observation of Patient/Vital Signs:  Patient is in bed with telemetry, SCD's, and peripheral IV in place.         Cognition/Neuro Status  Arousal/Alertness: Appropriate responses to stimuli  Attention Span: Appears intact  Orientation Level: Oriented X4  Memory: Appears intact (reports decreased recall and memory sometimes)  Following Commands: independent  Safety Awareness: minimal verbal instruction  Insights: Fully aware of deficits;Educated in safety awareness  Problem Solving: Assistance required to identify errors made;Assistance required to generate solutions;minimal assistance (for safety)  Behavior: attentive;calm;cooperative  Motor Planning:  intact  Coordination: intact  Hand Dominance: right handed    Gross ROM  Right Upper Extremity ROM: within functional limits  Left Upper Extremity ROM: within functional limits  Right Lower Extremity ROM: within functional limits  Left Lower Extremity ROM: within functional limits  Gross Strength  Right Upper Extremity Strength: 3+/5  Left Upper Extremity Strength: 3/5  Right Lower Extremity Strength: 4-/5  Left Lower Extremity Strength: 4-/5          Sensory  Auditory: intact  Tactile - Light Touch: intact  Visual Acuity: wears glasses       Self-care and Home Management  Eating: Independent;in a chair;Setup;Beverage management  Grooming: Contact Guard Assist;Minimal Assist;standing at sink;verbal prompting;wash/dry hands;wash/dry face  LB Dressing: Stand by Assist;edge of bed;Setup;Don/doff R sock;Don/doff L sock  Toileting: Contact Guard Assist;Minimal Assist;verbal prompting;supervison/safety;grab bar use;clothing management up;clothing management down;perineal hygiene  Functional Transfers: Contact Guard Assist;Minimal Assist;toilet transfer (w/RW)  Patient ambulated to bathroom, educated in toilet transfer with instructions for hand placement/usage to facilitate safe transfers while maintaining hip precautions; pt safe  toilet transfer; patient independent with clothing management and wiping; patient stood at sink to wash hands, no loss of balance with supervision    Mobility and Transfers  Rolling: Supervision;to Left  Scooting to EOB: Stand by Assist;using bedrail  Supine to Sit: Stand by Assist;using bedrail;HOB raised  Sit to Stand: Contact Guard Assist;Minimal Assist;with instruction for hand placement to increase safety (w/RW)  Bed to Chair: Minimal Assist (w/RW)  Functional Mobility/Ambulation: Minimal Assist (Pt ambulated 20 ft w/RW in room to simulate Hh distance)   Patient instructed on proper body mechanics for safe sit <> stands including pushing up with BUEs from surface rather then pulling up on  FWW, feeling for surface on back of legs prior to sitting and reaching back with one UE at a time before sitting.  Patient instructed in safe use and navigation with RW including proper hand placement, body mechanics, and positioning while completing functional ambulation.   Balance  Static Sitting Balance: good  Dyanamic Sitting Balance: good  Static Standing Balance: good (w/Rw)  Dynamic Standing Balance: fair (w/Rw)    Participation and Endurance  Participation Effort: excellent  Endurance: Tolerates 10 - 20 min exercise with multiple rests    AM-PACT "6 Clicks" Daily Activity Inpatient Short Form  Inpatient AM-PACT Performed?: yes  Put On/Take Off Lower Body Clothing: A little  Assist with Bathing: A lot  Assist with Toileting: None  Put On/Take Off Upper Body Clothing: None  Assist with Grooming: None  Assist with Eating: None  OT Daily Activity Raw Score: 21  CMS 0-100% Score: 32.79%    PMP - Progressive Mobility Protocol   PMP Activity: Step 6 - Walks in Room  Distance Walked (ft) (Step 6,7): 20 Feet    Treatment Activities: Orders received, chart reviewed and evaluation and initial tx completed as indicated above. Educated the patient to role of occupational therapy, plan of care, goals of therapy, HEP, safety with mobility and ADLs, and energy conservation techniques. Patient educated and instructed on scanning the environment for potential hazards to reduce risk of falls. Educated pt on bathroom safety to include removing all loose throw rugs on bathroom floor to reduce fall risks, having supervision during bathing in order for safety, using the tub seat in pt's bath tub at home for safety and energy conservation during bathing, and placing a BSC overtop of pt's toilet at home to increase pt's ease and safety w/ toilet transfers. Educated pt on [employer] Educated pt on energy conservation techniques to include pacing self during all functional activities, incorporating rest breaks into  daily routine, breathing properly during functional activities, making trips around the house count, allowing plenty of time for morning routine, and sitting to perform ADL's.  Pt was receptive to all education. Tolerated session well. Pt left seated in chair with call bell ,placed within reach. Alarm activated. RN notified of the outcome.    [place] ,OTR/L,CEIM  [facility]  Physical Medicine and Rehab Department          Therapist PPE during session procedural mask and gloves

## 2022-10-04 NOTE — PT Eval Note (Signed)
Physical Therapy Evaluation  Patient: Judy Freeman    S180/S180.A  Discharge Recommendations:   Based on today's session: Discharge Recommendation: Home with supervision;Home with home health PT       DME needs if pt returns home: DME Recommended for Discharge: Front wheel walker      Assessment:   Judy Freeman is a 47 y.o. female admitted 10/01/2022. Pt's presents with:  Assessment: Decreased LE strength;Decreased safety/judgement during functional mobility;Decreased endurance/activity tolerance;Visual deficit;Impaired coordination;Impaired motor control;Decreased functional mobility;Decreased balance;Gait impairment. Pt would continue to benefit from PT to address these deficits and increase functional independence.     Therapy Diagnosis: generalized weakness, decreased functional mobility , decreased independence with ADL's, increased gait dysfunction, decreased endurance/ activity engagement, decreased safety awareness, and impaired motor planning due to colitis/MS. Without therapy interventions, patient is at risk for falls, dependence on caregivers for mobility, dependence on caregivers for ADL's, decreased independence, failure to return to PLOF, decreased socialization, and decreased quality of life.    Rehabilitation Potential: Prognosis: Good;With continued PT status post acute discharge      Plan:    Treatment/Interventions: Exercise;Gait training;Stair training;Neuromuscular re-education;Functional transfer training;LE strengthening/ROM;Endurance training;Cognitive reorientation;Patient/family training;Bed mobility PT Frequency: 3-4x/wk    Risks/Benefits/POC Discussed with Pt/Family: With patient     Progress: Slow progress, medical status limitations    Goals:   Goals  Goal Formulation: With patient  Time for Goal Acheivement: By time of discharge  Goals: Select goal  Pt Will Perform Sit to Stand: independent;by time of discharge  Pt Will Transfer Bed/Chair: with rolling walker;with supervision;by  time of discharge  Pt Will Ambulate: 31-50 feet;with rolling walker;with supervision;by time of discharge       [facility] MEDICAL SURGICAL S180/S180.A    PT Received On: 10/04/22  Start Time: 1130  Stop Time: 1210  Time Calculation (min): 40 min    PT Visit Number: 1    Consult received for Judy Freeman for PT Evaluation and Treatment.  Patient's medical condition is appropriate for Physical therapy intervention at this time.      Precautions  Weight Bearing Status: no restrictions  Fall Risks: High;Impaired balance/gait;Muscle weakness  Other Precautions: h/o MS    Medical Diagnosis: Colitis [K52.9]    History of Present Illness: Judy Freeman is a 47 y.o. female admitted on 10/01/2022 with colitis/MS    Patient Active Problem List   Diagnosis    Multiple sclerosis exacerbation    Asthma    Hypothyroid    Colitis        Past Medical/Surgical History:  Past Medical History:   Diagnosis Date    Arthritis     lower back    Asthma     Breast lump 02/22/2017    painful lump left breast    Bulging lumbar disc     Calcific tendinitis     Disorder of thyroid     Per pt, doctor said no longer hypothyroid    MS (multiple sclerosis)     Pneumonia 2011, 2012    Sepsis 2011      Past Surgical History:   Procedure Laterality Date    CESAREAN SECTION      CHOLECYSTECTOMY      DEBRIDEMENT & IRRIGATION, ABSCESS  2015         X-Rays/Tests/Labs:  Chest AP Portable    Result Date: 10/02/2022  No acute abnormality. Johnsie Kindred, MD 10/02/2022 12:33 AM    CT Abd/Pelvis with IV Contrast  Result Date: 10/02/2022  Moderate apparent wall thickening of the entire colon, favor this is due to underdistention. Acute colitis cannot be excluded. Johnsie Kindred, MD 10/02/2022 12:06 AM       Social History:  Prior Level of Function  Prior level of function: Independent with ADLs, Ambulates independently  Baseline Activity Level: Community ambulation  Driving: does not drive  Cooking: light meal prep  Employment: Retired  Physiological scientist  Living Arrangements: Spouse/significant other  Type of Home: Apartment  Home Layout: Performs ADL's on one level, Stairs to enter with rails (add number in comment) (2 flight of stairs)  Bathroom Shower/Tub: Medical sales representative: Standard  Bathroom Accessibility: Accessible  Home Living - Notes / Comments: Pe rpt her family is able to assist as needed.      Subjective:    Patient is agreeable to participation in the therapy session.   Patient Goal  Patient Goal: to go home  Pain Assessment  Pain Assessment: No/denies pain       Objective:   Observation of Patient/Vital Signs:  Patient is in bed with peripheral IV in place.    Inspection/Posture  Inspection/Posture: WNL    Cognition/Neuro Status  Arousal/Alertness: Appropriate responses to stimuli  Attention Span: Appears intact  Orientation Level: Oriented X4  Memory: Appears intact  Following Commands: Follows all commands and directions without difficulty  Safety Awareness: moderate verbal instruction  Insights: Decreased awareness of deficits;Educated in safety awareness  Problem Solving: Assistance required to identify errors made;Assistance required to generate solutions;Assistance required to implement solutions;minimal assistance  Behavior: attentive;calm;cooperative  Motor Planning: decreased processing speed;decreased initiation;bradykinesia  Coordination: intact  Hand Dominance: right handed      Hearing: WNL  Vision: wears glasses and impaired   Sensation: WNL    Gross ROM  Right Lower Extremity ROM: within functional limits  Left Lower Extremity ROM: within functional limits  Gross Strength  Right Lower Extremity Strength: 4-/5  Left Lower Extremity Strength: 4-/5  Tone  Tone: within functional limits    Functional Mobility  Rolling: Supervision;to Left  Supine to Sit: Supervision;to Left;Increased Time;Increased Effort;using bedrail  Scooting to EOB: Supervision  Sit to Supine: Supervision;to Left  Sit to Stand: Increased  Time;Increased Effort;using bedrail;bed elevated;Contact Guard Assist  Stand to Sit: Contact Guard Assist  Transfers  Bed to Chair: Scientist, product/process development Transfers: Cabin crew Used for Functional Transfer: front-wheeled walker  Locomotion  Ambulation: with front-wheeled walker;Contact Guard Assist  Pattern: decreased cadence;decreased step length;Step through  Door Management: Youth worker Walked (ft) (Step 6,7): 20 Feet  PMP Activity: Step 6 - Walks in Room     Balance  Balance: within functional limits    Participation and Endurance  Participation Effort: excellent  Endurance: Tolerates 30 min exercise with multiple rests         AM-PACT Inpatient Short Forms  Inpatient AM-PACT Performed? (PT): Basic Mobility Inpatient Short Form  AM-PACT "6 Clicks" Basic Mobility Inpatient Short Form  Turning Over in Bed: None  Sitting Down On/Standing From Armchair: A little  Lying on Back to Sitting on Side of Bed: None  Assist Moving to/from Bed to Chair: A little  Assist to Walk in Hospital Room: A little  Assist to Climb 3-5 Steps with Railing: A lot  PT Basic Mobility Raw Score: 19  CMS 0-100% Score: 41.77%    Treatment Activities: Supervision to CGA for all functional mobility from rolling in  bed to ambulating in room with FWW and frequent verbal and tactile cues for safety with FWW/backing up to chair/toilet with proper pushing up/reaching back to reduce risk of falls.  Educated patient in the importance of monitoring dizziness before and with movement especially after prolonged rest time. Instructed patient to sit at EOB for about 60 seconds when first going from supine to sit to check for dizziness or lightheadedness,  then to stand and remain in place for about 60 seconds to check for dizziness or lightheadedness. Advised patient to complete ankle pumps or knee squeezes to assist with blood circulation if needed. Educated patient if dizziness persists, then to remain  seated. Pt denied dizziness in sitting and standing/walking.  Patient instructed in reclined glute squeezes/quat sets/heel slides/ankle pumps x 10 BLE with a focus on pacing between exercises, full ROM, and isometric contractions. Advised patient to complete established home exercise plan at least twice a day to increase generalized strength, endurance, and to facilitate increased independence with mobility and ADL's. Issued home exercise plan to patient.         Educated the patient to role of physical therapy, plan of care, goals of therapy and HEP, safety with mobility and ADLs, home safety.          Therapist PPE during session gloves

## 2022-10-04 NOTE — Nursing Progress Note (Addendum)
Patient alert and oriented, resting in the room comfortably with wife at bedside. C/o abdominal pain and N/V - administered zofran x2 with positive effects. Pt states she's hungry but hesitant to eat or drink d/t N/V. NPO at midnight for procedure tomorrow and stool sample is still needed, pt is aware. 1 loose BM this shift. Will continue to monitor.

## 2022-10-04 NOTE — Plan of Care (Signed)
Problem: Pain interferes with ability to perform ADL  Goal: Pain at adequate level as identified by patient  Outcome: Progressing  Flowsheets (Taken 10/04/2022 0741)  Pain at adequate level as identified by patient:   Identify patient comfort function goal   Assess for risk of opioid induced respiratory depression, including snoring/sleep apnea. Alert healthcare team of risk factors identified.   Assess pain on admission, during daily assessment and/or before any "as needed" intervention(s)   Reassess pain within 30-60 minutes of any procedure/intervention, per Pain Assessment, Intervention, Reassessment (AIR) Cycle   Evaluate if patient comfort function goal is met   Evaluate patient's satisfaction with pain management progress   Offer non-pharmacological pain management interventions   Consult/collaborate with Pain Service     Problem: Side Effects from Pain Analgesia  Goal: Patient will experience minimal side effects of analgesic therapy  Outcome: Progressing  Flowsheets (Taken 10/04/2022 0741)  Patient will experience minimal side effects of analgesic therapy:   Monitor/assess patient's respiratory status (RR depth, effort, breath sounds)   Prevent/manage side effects per LIP orders (i.e. nausea, vomiting, pruritus, constipation, urinary retention, etc.)   Evaluate for opioid-induced sedation with appropriate assessment tool (i.e. POSS)   Assess for changes in cognitive function     Problem: Moderate/High Fall Risk Score >5  Goal: Patient will remain free of falls  Outcome: Progressing  Flowsheets (Taken 10/04/2022 0741)  Moderate Risk (6-13):   MOD-Consider activation of bed alarm if appropriate   MOD-Floor mat at bedside (where available) if appropriate   MOD-Remain with patient during toileting   MOD-Re-orient confused patients   MOD-Perform dangle, stand, walk (DSW) prior to mobilization   MOD-Use gait belt when appropriate   MOD- Consider video monitoring   MOD-Request PT/OT consult order for patients with  gait/mobility impairment   MOD-Utilize diversion activities   MOD-Place bedside commode and assistive devices out of sight when not in use   MOD-Consider a move closer to Nurses Station   MOD-Apply bed exit alarm if patient is confused   MOD-include family in multidisciplinary POC discussions     Problem: Safety  Goal: Patient will be free from injury during hospitalization  Outcome: Progressing  Flowsheets (Taken 10/04/2022 0741)  Patient will be free from injury during hospitalization:   Assess patient's risk for falls and implement fall prevention plan of care per policy   Use appropriate transfer methods   Include patient/ family/ care giver in decisions related to safety   Ensure appropriate safety devices are available at the bedside   Hourly rounding   Provide and maintain safe environment

## 2022-10-05 ENCOUNTER — Encounter
Admission: EM | Disposition: A | Discharge status: Home / Self Care | Payer: Self-pay | Source: Home / Self Care | Attending: Internal Medicine

## 2022-10-05 ENCOUNTER — Inpatient Hospital Stay: Payer: 59 | Admitting: Anesthesiology

## 2022-10-05 ENCOUNTER — Encounter: Payer: Self-pay | Admitting: Internal Medicine

## 2022-10-05 DIAGNOSIS — E860 Dehydration: Principal | ICD-10-CM | POA: Diagnosis present

## 2022-10-05 DIAGNOSIS — K922 Gastrointestinal hemorrhage, unspecified: Secondary | ICD-10-CM

## 2022-10-05 DIAGNOSIS — K529 Noninfective gastroenteritis and colitis, unspecified: Principal | ICD-10-CM

## 2022-10-05 HISTORY — PX: COLONOSCOPY, WITH BIOPSY: SHX3468

## 2022-10-05 LAB — STOOL CALPROTECTIN: Calprotectin, Stool: <16.1 mg/kg (ref <50.0)

## 2022-10-05 SURGERY — COLONOSCOPY, WITH BIOPSY
Anesthesia: Anesthesia General | Site: Anus | Wound class: Clean Contaminated

## 2022-10-05 MED ORDER — PROPOFOL 10 MG/ML IV EMUL (WRAP)
INTRAVENOUS | Status: AC
Start: 2022-10-05 — End: ?
  Filled 2022-10-05: qty 20

## 2022-10-05 MED ORDER — LIDOCAINE HCL 2 % IJ SOLN
INTRAMUSCULAR | Status: DC | PRN
Start: 2022-10-05 — End: 2022-10-05
  Administered 2022-10-05: 80 mg via INTRAVENOUS

## 2022-10-05 MED ORDER — FLEET ENEMA 7-19 GM/118ML RE ENEM
1.0000 | ENEMA | Freq: Once | RECTAL | Status: AC
Start: 2022-10-05 — End: 2022-10-05
  Administered 2022-10-05: 1 via RECTAL

## 2022-10-05 MED ORDER — LACTATED RINGERS IV SOLN
INTRAVENOUS | Status: AC
Start: 2022-10-05 — End: 2022-10-05

## 2022-10-05 MED ORDER — PROPOFOL 10 MG/ML IV EMUL (WRAP)
INTRAVENOUS | Status: DC | PRN
Start: 2022-10-05 — End: 2022-10-05
  Administered 2022-10-05: 20 mg via INTRAVENOUS
  Administered 2022-10-05: 10 mg via INTRAVENOUS
  Administered 2022-10-05: 80 mg via INTRAVENOUS

## 2022-10-05 MED ORDER — OPIUM 10 MG/ML (1%) PO TINC
0.6000 mL | ORAL | Status: DC | PRN
Start: 2022-10-05 — End: 2022-10-09
  Administered 2022-10-05 – 2022-10-09 (×14): 0.6 mL via ORAL
  Filled 2022-10-05 (×14): qty 0.6

## 2022-10-05 MED ORDER — LIDOCAINE HCL (PF) 2 % IJ SOLN
INTRAMUSCULAR | Status: AC
Start: 2022-10-05 — End: ?
  Filled 2022-10-05: qty 5

## 2022-10-05 SURGICAL SUPPLY — 21 items
ADAPTER ENDOSCOPE CLEANING AIR WATER PORPOISE OLYMPUS 160 180 190 (Adapter) ×1 IMPLANT
ADAPTER ESCP CLN PORPOISE AIR H2O CHNL (Adapter) ×1
BASIN EMESIS 700 ML GRADUATE GRAPHITE (Patient Supply)
BASIN EMESIS 700 ML GRADUATE GRAPHITE MAUVE PLASTIC MEDLINE ORAL (Patient Supply) IMPLANT
BRUSH ENDOSCOPIC CLEANING SLIM 2 BUNDLE (Endoscopic Supplies) ×1
BRUSH ENDOSCOPIC CLEANING SLIM 2 BUNDLE CATHETER ROBUST VALVE OD5 MM (Endoscopic Supplies) ×1 IMPLANT
CONTAINER HST 10% NTRL BF FRMLN PP 45ML (Solution) ×4 IMPLANT
FORCEPS BIOPSY L240 CM LARGE CAPACITY (Instrument) ×1
FORCEPS BIOPSY L240 CM MICROMESH TEETH STREAMLINE CATHETER NEEDLE (Instrument) ×1 IMPLANT
FORCEPS BIOPSY L240 CM STANDARD CAPACITY (Instrument)
GLOVE SURGICAL 8 BIOGEL PI INDICATOR (Glove) ×1
GLOVE SURGICAL 8 BIOGEL PI INDICATOR UNDERGLOVE POWDER FREE SMOOTH (Glove) ×1 IMPLANT
GOWN ISOLATION XL HOOK LOOP NECK KNIT (Patient Supply) ×2 IMPLANT
KIT ENDOSCOPIC COMPLIANCE ENDOKIT (Kits) ×1
KIT ENDOSCOPIC COMPLIANCE ENDOKIT ORCAPOD 4 1.1 OZ (Kits) ×1 IMPLANT
MANIFOLD SUCTION 2 STANDARD 4 PORT (Filter) ×1
MANIFOLD SUCTION 2 STANDARD 4 PORT NEPTUNE 2 WASTE MANAGEMENT SYSTEM (Filter) ×1 IMPLANT
SYRINGE 50 ML GRADUATE NONPYROGENIC DEHP (Syringes, Needles)
SYRINGE 50 ML GRADUATE NONPYROGENIC DEHP FREE PVC FREE BD MEDICAL (Syringes, Needles) IMPLANT
TUBING SUCTION ID3/16 IN L10 FT (Tubing) ×1
TUBING SUCTION ID3/16 IN L10 FT NONCONDUCTIVE STRAIGHT MALE FEMALE (Tubing) ×1 IMPLANT

## 2022-10-05 NOTE — Plan of Care (Signed)
Problem: Pain interferes with ability to perform ADL  Goal: Pain at adequate level as identified by patient  Outcome: Progressing     Problem: Side Effects from Pain Analgesia  Goal: Patient will experience minimal side effects of analgesic therapy  Outcome: Progressing     Problem: Moderate/High Fall Risk Score >5  Goal: Patient will remain free of falls  Outcome: Progressing     Problem: Safety  Goal: Patient will be free from injury during hospitalization  Outcome: Progressing     Problem: Fluid and Electrolyte Imbalance/ Endocrine  Goal: Fluid and electrolyte balance are achieved/maintained  Outcome: Progressing     Problem: Compromised Sensory Perception  Goal: Sensory Perception Interventions  Outcome: Progressing     Problem: Compromised Activity/Mobility  Goal: Activity/Mobility Interventions  Outcome: Progressing     Problem: Compromised Nutrition  Goal: Nutrition Interventions  Outcome: Progressing

## 2022-10-05 NOTE — UM Notes (Addendum)
*PT UPGRADED TO IP, 7/11*  Admit to Inpatient (Order #[id number]) on 10/05/22     PATIENT NAME: Judy Freeman, Judy Freeman   DOB: Jul 18, 1975     Reason for Admission   47 Year old female admitted with acute diffused colitis.     ROS:  Constitutional:  Positive for malaise/fatigue.   Gastrointestinal:  Positive for abdominal pain, diarrhea, nausea and vomiting.     Vital Signs:  Vitals:    10/05/22 1340 10/05/22 1350 10/05/22 1511 10/05/22 1814   BP: 124/87  116/72 156/89   Pulse: 70 64 60 63   Resp: 16 (!) 11 14 16    Temp:   98.2 F (36.8 C) 98.8 F (37.1 C)   TempSrc:   Oral Oral   SpO2: 99% 99% 100% 100%   Weight:       Height:           Intake/Output Summary (Last 24 hours) at 10/05/2022 2029  Last data filed at 10/05/2022 1700  Gross per 24 hour   Intake 900 ml   Output --   Net 900 ml     Plan of Care:  . Acute diffused Colitis  Patient is having symptom for few years off-and-on acute flareups.  Patient had colonoscopy and EGD in the past.  She follows with new gastroenterologist patient started on antibiotic for suspected infectious colitis but she may have inflammatory bowel disease according to patient her last colonoscopy was fine she may have microscopic inflammatory bowel disease.  Further recommendation treatment per gastroenterologist patient is going for sigmoidoscopy today.     2. Possible viral URI     Continue supportive care     3. Recurrent intractable nausea, vomiting and diarrhea   secondary to colitis.  Underlying cause appreciate gastroenterologist input we will follow-up on recommendation.  Patient is n.p.o. now.  Better recommendation per gastroenterologist after the sigmoidoscopy.     4. Migraine headache    Stable continue supportive care continue as needed     5. Generalized weakness medication.    History of MS will consult PT OT follow-up on recommendation.     6. Multiple sclerosis on monthly Kesimpt     7. Anemia: chronic  - hemoglobin: 9.8 (10/02/22) down from 11.3 (10/01/22)  - mean corpuscular  volume: 84 (10/02/22)  - est. baseline hemoglobin: 10.2 g/dL     8. Hypothyroidism  - holding home: levothyroxine 50 mcg po     9. Hypocalcemia  - calcium: 8.4 (10/02/22) down from 9.8, corrected calcium: 8.1 mg/dL (4/0/98)  - magnesium: 1.7 (10/02/22) down from 1.8 (10/01/22)  - currently on: lactated ringers infusion iv continuous  - holding home: cholecalciferol 0.25 mg 20000 u po     10. Overweight  - BMI: 28.8 (10/02/22)    DVT Prophylaxis:Medication VTE Prophylaxis Orders: enoxaparin (LOVENOX) syringe 40 mg  Mechanical VTE Prophylaxis Orders: Maintain sequential compression device   Central Line/Foley Catheter/PICC line status: None  Code Status: Full Code  Disposition:Planning to discharge patient to home  Type of Admission:Inpatient    Medications:  Scheduled Meds:  Current Facility-Administered Medications   Medication Dose Route Frequency    cefTRIAXone  1 g Intravenous Q24H    enoxaparin  40 mg Subcutaneous Daily    fluticasone  1 spray Each Nare Daily    lactobacillus/streptococcus  1 capsule Oral Daily    metroNIDAZOLE  500 mg Intravenous Q8H     Continuous Infusions:   sodium chloride 125 mL/hr at 10/05/22 0359  PRN Meds:.acetaminophen, benzocaine-menthol, benzonatate, carboxymethylcellulose sodium, dextrose **OR** dextrose **OR** dextrose **OR** glucagon (rDNA), HYDROcodone-acetaminophen, magnesium sulfate, melatonin, morphine, naloxone, ondansetron, opium, potassium & sodium phosphates, potassium chloride **AND** potassium chloride, saline      PRN:    UTILIZATION REVIEW CONTACT: Gae Gallop RN, BSN  Utilization Review   [facility]  [address]  [address], [address]  [place], [place] [address]  Phone: [phone] (Voice Mail Only)  Email: [email or web address]   NPI:   [id number]  Tax ID:  [id number]         NOTES TO REVIEWER:    This clinical review is based on/compiled from documentation provided by the treatment team within the patient's medical record.

## 2022-10-05 NOTE — Progress Notes (Signed)
[facility]PITAL[facility] Progress Note  Patient Info:   Date/Time: 10/05/2022 / 3:15 PM   Admit Date:10/01/2022  Patient Name:Judy Freeman   ZOX:[id number]   PCP: Hurley Cisco, MD  Attending Physician:Drayke Grabel, Drucilla Schmidt, MD  Assessment/Plan:   Judy Freeman is a 47 y.o. female came with the chief   complaint of Abdominal Pain and Emesis  46 year old woman with MS, chronic intermittent N/V and diarrhea followed by GI as outpatient, migraine headache presented with c/o nausea, vomiting and diarrhea. CT suggestive diffused colitis     1. Acute diffused Colitis  Patient is having symptom for few years off-and-on acute flareups.  Patient had colonoscopy and EGD in the past.  She follows with new gastroenterologist patient started on antibiotic for suspected infectious colitis but she may have inflammatory bowel disease according to patient her last colonoscopy was fine she may have microscopic inflammatory bowel disease.  Further recommendation treatment per gastroenterologist patient is going for sigmoidoscopy today.     2. Possible viral URI     Continue supportive care     3. Recurrent intractable nausea, vomiting and diarrhea   secondary to colitis.  Underlying cause appreciate gastroenterologist input we will follow-up on recommendation.  Patient is n.p.o. now.  Better recommendation per gastroenterologist after the sigmoidoscopy.     4. Migraine headache    Stable continue supportive care continue as needed     5. Generalized weakness medication.    History of MS will consult PT OT follow-up on recommendation.     6. Multiple sclerosis on monthly Kesimpt     7. Anemia: chronic  - hemoglobin: 9.8 (10/02/22) down from 11.3 (10/01/22)  - mean corpuscular volume: 84 (10/02/22)  - est. baseline hemoglobin: 10.2 g/dL     8. Hypothyroidism  - holding home: levothyroxine 50 mcg po     9. Hypocalcemia  - calcium: 8.4 (10/02/22) down from 9.8, corrected calcium: 8.1 mg/dL (12/02/09)  - magnesium: 1.7 (10/02/22) down from 1.8 (10/01/22)  -  currently on: lactated ringers infusion iv continuous  - holding home: cholecalciferol 0.25 mg 20000 u po     10. Overweight  - BMI: 28.8 (10/02/22)          DVT Prophylaxis:Medication VTE Prophylaxis Orders: enoxaparin (LOVENOX) syringe 40 mg  Mechanical VTE Prophylaxis Orders: Maintain sequential compression device   Central Line/Foley Catheter/PICC line status: None  Code Status: Full Code  Disposition:Planning to discharge patient to home  Type of Admission:Inpatient  Hospital Problems:     Active Hospital Problems    Diagnosis    Dehydration    Colitis     Subjective:   Chief Complaint:  Abdominal Pain and Emesis    10/05/22   Review of Systems   Constitutional:  Positive for malaise/fatigue.   HENT:  Negative for hearing loss and tinnitus.    Eyes:  Negative for blurred vision and double vision.   Cardiovascular:  Negative for chest pain and palpitations.   Gastrointestinal:  Positive for abdominal pain, diarrhea, nausea and vomiting.   Skin:  Negative for itching and rash.     Objective:     Vitals:    10/05/22 1337 10/05/22 1340 10/05/22 1350 10/05/22 1511   BP: 105/78 124/87  116/72   Pulse: 75 70 64 60   Resp: (!) 11 16 (!) 11 14   Temp: 98.4 F (36.9 C)   98.2 F (36.8 C)   TempSrc: Tympanic   Oral   SpO2: 99% 99% 99%  100%   Weight:       Height:         Physical Exam:   Physical Exam  Constitutional:       General: She is not in acute distress.     Appearance: She is not ill-appearing.   Cardiovascular:      Heart sounds: No murmur heard.     No friction rub.   Pulmonary:      Effort: Respiratory distress present.      Breath sounds: Stridor present.   Skin:     Coloration: Skin is pale.   Neurological:      Sensory: Sensory deficit present.       Current Medications      Scheduled Meds: PRN Meds:    [Transfer Hold] cefTRIAXone, 1 g, Intravenous, Q24H  [Transfer Hold] enoxaparin, 40 mg, Subcutaneous, Daily  [Transfer Hold] fluticasone, 1 spray, Each Nare, Daily  [Transfer Hold]  lactobacillus/streptococcus, 1 capsule, Oral, Daily  [Transfer Hold] metroNIDAZOLE, 500 mg, Intravenous, Q8H        Continuous Infusions:   sodium chloride 125 mL/hr at 10/05/22 0359    lactated ringers 50 mL/hr at 10/05/22 1312    [Transfer Hold] acetaminophen, 650 mg, Q6H PRN  [Transfer Hold] benzocaine-menthol, 1 lozenge, Q2H PRN  [Transfer Hold] benzonatate, 100 mg, TID PRN  [Transfer Hold] carboxymethylcellulose sodium, 1 drop, TID PRN  [Transfer Hold] dextrose, 15 g of glucose, PRN   Or  [Transfer Hold] dextrose, 12.5 g, PRN   Or  [Transfer Hold] dextrose, 12.5 g, PRN   Or  [Transfer Hold] glucagon (rDNA), 1 mg, PRN  [Transfer Hold] HYDROcodone-acetaminophen, 1 tablet, Q6H PRN  [Transfer Hold] magnesium sulfate, 1 g, PRN  [Transfer Hold] melatonin, 3 mg, QHS PRN  [Transfer Hold] morphine, 2 mg, Q4H PRN  [Transfer Hold] naloxone, 0.2 mg, PRN  [Transfer Hold] ondansetron, 4 mg, Q6H PRN  opium, 0.6 mL, Q4H PRN  [Transfer Hold] potassium & sodium phosphates, 2 packet, PRN  [Transfer Hold] potassium chloride, 0-40 mEq, PRN   And  [Transfer Hold] potassium chloride, 10 mEq, PRN  [Transfer Hold] saline, 2 spray, Q4H PRN          Results of Labs/imaging   Labs for the last 24 hours have been reviewed:   Coagulation Profile:   Recent Labs   Lab 10/01/22  2235   PT 11.3   INR 1.0       CBC review:   Recent Labs   Lab 10/02/22  0332 10/01/22  2235   WBC 4.53 3.78   Hemoglobin 9.8* 11.3*   Hematocrit 31.1* 34.0*   Platelet Count 290 299   MCV 84.1 81.3   RDW 14 14   Neutrophils %  --  71.1   Lymphocytes %  --  19.0   Eosinophils %  --  2.4   Immature Granulocytes %  --  0.3   Absolute Neutrophils  --  2.69   Absolute Immature Granulocytes  --  0.01     Chem Review:  Recent Labs   Lab 10/02/22  0332 10/01/22  2235   Sodium 137 136   Potassium 3.8 4.0   Chloride 107 104   CO2 22 18   BUN 5* 6*   Creatinine 0.6 0.7   Glucose 104* 131*   Calcium 8.4* 9.8   Magnesium 1.7 1.8   Bilirubin, Total  --  0.7   AST (SGOT)  --  21    ALT  --  12   Alkaline Phosphatase  --  63     Results       Procedure Component Value Units Date/Time    Stool Calprotectin [[id number]]  (Normal) Collected: 10/04/22 1748    Specimen: Stool Updated: 10/05/22 1443     Calprotectin, Stool <16.1 mg/kg     Culture, Blood, Aerobic And Anaerobic [[id number]] Collected: 10/02/22 0113    Specimen: Blood, Venous Updated: 10/05/22 0900     Culture Blood No growth at 3 days    Culture, Throat [[id number]] Collected: 10/03/22 1732    Specimen: Swab from Throat Updated: 10/05/22 0743     Culture Throat Culture in progress, further report to follow    Culture, Blood, Aerobic And Anaerobic [[id number]] Collected: 10/01/22 2301    Specimen: Blood, Venous Updated: 10/05/22 0500     Culture Blood No growth at 3 days    Stool Cryptosporidium parvum and Giardia lamblia, Antigen [[id number]]  (Normal) Collected: 10/04/22 1748    Specimen: Stool Updated: 10/04/22 2332     Stool Cryptosporidium parvum antigen Negative     Stool Giardia lamblia antigen Negative    Stool WBC [[id number]]  (Normal) Collected: 10/04/22 1748    Specimen: Stool Updated: 10/04/22 1901     RBC Wright Stain None Seen     Neutrophils Delford Field Stain None Seen     Lymphocytes, Wright Stain None Seen     Eosinophils Delford Field Stain None Seen    Fecal Fat, Qualitative [[id number]] Collected: 10/04/22 1748    Specimen: Stool Updated: 10/04/22 1756    Stool Pancreatic Elastase [[id number]] Collected: 10/04/22 1748    Specimen: Stool Updated: 10/04/22 1755    C Reactive Protein [[id number]]  (Normal) Collected: 10/04/22 1600    Specimen: Blood, Venous Updated: 10/04/22 1627     C-Reactive Protein <0.1 mg/dL     Inflammatory Bowel Disease Panel [[id number]] Collected: 10/04/22 1600    Specimen: Blood, Venous Updated: 10/04/22 1611          Radiology reports have been reviewed:  Radiology Results (24 Hour)       ** No results found for the last 24 hours. **          Chest AP Portable    Result Date: 10/02/2022  HISTORY: Sepsis  COMPARISON: 06/23/2022. FINDINGS: LUNGS: No consolidation or edema. PLEURA: No pleural effusions or pneumothorax. HEART AND MEDIASTINUM:  No cardiac enlargement. BONES:  Suboptimally evaluated.     No acute abnormality. Johnsie Kindred, MD 10/02/2022 12:33 AM    CT Abd/Pelvis with IV Contrast    Result Date: 10/02/2022  HISTORY: Abdominal pain, nausea, vomiting, diarrhea. COMPARISON: None available. TECHNIQUE: CT abdomen and pelvis WITH intravenous contrast. The following dose reduction techniques were utilized: Automated exposure control and/or adjustment of the mA and/or kV according to patient size, and the use of iterative reconstruction technique. CONTRAST: iohexol (OMNIPAQUE) 350 MG/ML injection 100 mL. FINDINGS: LOWER THORAX: Status post cholecystectomy. No biliary dilation. LIVER/BILIARY TREE: No mass or intrahepatic biliary dilatation. No gallbladder distension or calcified gallstones. SPLEEN: No splenomegaly. PANCREAS: No pancreatic mass or duct dilatation. KIDNEYS/URETERS: No hydronephrosis, stones or solid mass lesions. ADRENALS: No adrenal mass. PELVIC ORGANS/BLADDER: No pelvic masses. PERITONEUM/RETROPERITONEUM: No free air or fluid. LYMPH NODES: No lymphadenopathy. VESSELS: No aortic aneurysm. GI TRACT: Moderate apparent wall thickening of the entire colon, favor this is due to underdistention. Acute colitis cannot be excluded. No bowel obstruction. Normal appendix. BONES AND SOFT TISSUES: Unremarkable.     Moderate apparent wall thickening of the  entire colon, favor this is due to underdistention. Acute colitis cannot be excluded. Johnsie Kindred, MD 10/02/2022 12:06 AM    Pathology:   Specimens (From admission, onward)       Start     Ordered    10/05/22 1326  Surgical Pathology  RELEASE UPON ORDERING        Question Answer Comment   Specimen Type Tissue    Release to patient Immediate        10/05/22 1326    10/05/22 1324  Surgical Pathology  RELEASE UPON ORDERING,   Status:  Canceled        Question Answer  Comment   Specimen Type Tissue    Release to patient Immediate        10/05/22 1324                  Last EKG Result       None          Hospitalist   Signed by:   Zada Finders, MD      *This note was generated by the Epic EMR system/ Dragon speech recognition and may contain inherent errors or omissions not intended by the user. Grammatical errors, random word insertions, deletions, pronoun errors and incomplete sentences are occasional consequences of this technology due to software limitations. Not all errors are caught or corrected. If there are questions or concerns about the content of this note or information contained within the body of this dictation they should be addressed directly with the author for clarification

## 2022-10-05 NOTE — PT Plan of Care Note (Signed)
[facility]   Physical Therapy Cancellation Note      Patient:  Judy Freeman MRN#:  [id number]  Unit:  [facility] MAIN PREOPERATIVE Room/Bed:  LOMAINPREOP/LOMAINPREOP    10/05/2022  Time: Start Time: 1130       PT Cancellation: Visit  PT Visit Cancellation Reason: Patient/caregiver declines therapy at this time         7/11 Attempted to see pt x 2 but pt refused both times due to abdominal pain and having a procedure today.  MCL

## 2022-10-05 NOTE — OT Progress Note (Signed)
[facility]   Occupational Therapy Cancel Note      Patient:  Judy Freeman MRN#:  [id number]  Unit:  [facility] MEDICAL SURGICAL Room/Bed:  S180/S180.A    10/05/2022  Time: Start Time: 1140       OT Cancellation: Visit  OT Visit Cancellation Reason: Patient/caregiver declines therapy at this time. Pt declined due to c/o severe abdominal pain, nausea and vomiting. OT will continue to monitor.    [place] ,OTR/L,CEIM  [facility]  Physical Medicine and Rehab Department

## 2022-10-05 NOTE — Anesthesia Preprocedure Evaluation (Signed)
Anesthesia Evaluation    AIRWAY    Mallampati: II    TM distance: >3 FB  Neck ROM: full  Mouth Opening:full  Planned to use difficult airway equipment: No CARDIOVASCULAR    cardiovascular exam normal, regular and normal       DENTAL    no notable dental hx               PULMONARY    pulmonary exam normal and clear to auscultation     OTHER FINDINGS                                      ===============================================================    ADDITIONAL MEDICAL HISTORY      No LMP recorded.    Body mass index is 28.82 kg/m.    Stop-Bang Score: 1    Date of last liquid: 10/04/22  Time of last liquid: 0000   Date of last solid: 10/04/22  Time of last solid: 1730       Scheduled  Procedure(s):  SIGMOIDOSCOPY, FLEXIBLE  Attending Physician: Zada Finders, MD  Admission Date:10/01/2022      Assessment:      Patient Active Problem List   Diagnosis    Multiple sclerosis exacerbation    Asthma    Hypothyroid    Colitis    Dehydration          Past Medical History:   Diagnosis Date    Arthritis     lower back    Asthma     Breast lump 02/22/2017    painful lump left breast    Bulging lumbar disc     Calcific tendinitis     Disorder of thyroid     Per pt, doctor said no longer hypothyroid    MS (multiple sclerosis)     Pneumonia 2011, 2012    Sepsis 2011            Past Surgical History:   Procedure Laterality Date    CESAREAN SECTION      CHOLECYSTECTOMY      DEBRIDEMENT & IRRIGATION, ABSCESS  2015       Social History     Social History     Tobacco Use    Smoking status: Former     Types: Cigarettes    Smokeless tobacco: Never   Substance Use Topics    Alcohol use: Not Currently     Comment: occasionally      Allergies:     Allergies   Allergen Reactions    Penicillins Hives     Hospital Medications:     Current Facility-Administered Medications   Medication Dose Route Frequency    [Transfer Hold] cefTRIAXone  1 g Intravenous Q24H    [Transfer Hold] enoxaparin  40 mg Subcutaneous Daily    [Transfer Hold] fluticasone   1 spray Each Nare Daily    [Transfer Hold] lactobacillus/streptococcus  1 capsule Oral Daily    [Transfer Hold] metroNIDAZOLE  500 mg Intravenous Q8H     Home Medications:     Medications Prior to Admission   Medication Sig Dispense Refill Last Dose    HYDROcodone-acetaminophen (NORCO) 5-325 MG per tablet Take 1 tablet by mouth every 6 (six) hours as needed       Kesimpta 20 MG/0.4ML Solution Auto-injector INJECT 1 PEN UNDER THE SKIN EVERY MONTH 1 each 11     loperamide (IMODIUM A-D) 2  MG tablet Take 1 tablet (2 mg) by mouth as needed for Diarrhea       ondansetron (ZOFRAN-ODT) 4 MG disintegrating tablet Take 1 tablet (4 mg) by mouth every 6 (six) hours as needed for Nausea 8 tablet 0     pregabalin (LYRICA) 75 MG capsule Take 2 capsules (150 mg) by mouth nightly Taking 150mg  qhs       Rimegepant Sulfate (NURTEC PO) Take by mouth as needed For migraines       Armodafinil (NUVIGIL) 250 MG tablet Take 1 tablet (250 mg) by mouth every morning 30 tablet 0     Cholecalciferol (Vitamin D3) 250 MCG (10000 UT) capsule Take 20,000 Units by mouth daily 90 capsule 3     diazePAM (VALIUM) 2 MG tablet Take 2 mg by mouth every 12 (twelve) hours as needed (Patient not taking: Reported on 05/17/2021)       dicyclomine (BENTYL) 20 MG tablet Take 1 tablet (20 mg total) by mouth every 8 (eight) hours as needed (abdominal pain) 15 tablet 0     Levothyroxine Sodium 50 MCG Cap Take 50 mcg by mouth daily    (Patient not taking: Reported on 05/17/2021)       oxybutynin (DITROPAN-XL) 10 MG 24 hr tablet Take 1 tablet (10 mg) by mouth daily 90 tablet 3     Solriamfetol HCl (Sunosi) 150 MG Tab Take by mouth daily (Patient not taking: Reported on 05/17/2021)              Vitals:     Vitals:    10/05/22 1226   BP: (!) 136/92   Pulse: 65   Resp:    Temp: 36.3 C (97.3 F)   SpO2: 99%       Labs:   No results found for: "UABHCG", "GLUCOSEWB"  Results       Procedure Component Value Units Date/Time    Culture, Blood, Aerobic And Anaerobic [[id number]]  Collected: 10/02/22 0113    Specimen: Blood, Venous Updated: 10/05/22 0900     Culture Blood No growth at 3 days    Culture, Throat [[id number]] Collected: 10/03/22 1732    Specimen: Swab from Throat Updated: 10/05/22 0743     Culture Throat Culture in progress, further report to follow    Culture, Blood, Aerobic And Anaerobic [[id number]] Collected: 10/01/22 2301    Specimen: Blood, Venous Updated: 10/05/22 0500     Culture Blood No growth at 3 days    Stool Cryptosporidium parvum and Giardia lamblia, Antigen [[id number]]  (Normal) Collected: 10/04/22 1748    Specimen: Stool Updated: 10/04/22 2332     Stool Cryptosporidium parvum antigen Negative     Stool Giardia lamblia antigen Negative    Stool WBC [[id number]]  (Normal) Collected: 10/04/22 1748    Specimen: Stool Updated: 10/04/22 1901     RBC Wright Stain None Seen     Neutrophils Delford Field Stain None Seen     Lymphocytes, Wright Stain None Seen     Eosinophils Wright Stain None Seen    Stool Calprotectin [[id number]] Collected: 10/04/22 1748    Specimen: Stool Updated: 10/04/22 1756    Fecal Fat, Qualitative [[id number]] Collected: 10/04/22 1748    Specimen: Stool Updated: 10/04/22 1756    Stool Pancreatic Elastase [[id number]] Collected: 10/04/22 1748    Specimen: Stool Updated: 10/04/22 1755    C Reactive Protein [[id number]]  (Normal) Collected: 10/04/22 1600    Specimen: Blood, Venous Updated: 10/04/22 1627     C-Reactive Protein <0.1 mg/dL  Inflammatory Bowel Disease Panel [[id number]] Collected: 10/04/22 1600    Specimen: Blood, Venous Updated: 10/04/22 1611          Recent Labs   Lab 10/02/22  0332   WBC 4.53   Hemoglobin 9.8*   Hematocrit 31.1*   Platelet Count 290        Lab Results   Component Value Date    HGB 9.8 (L) 10/02/2022    PLT 290 10/02/2022       Lab Results   Component Value Date    NA 137 10/02/2022    CL 107 10/02/2022    CO2 22 10/02/2022    K 3.8 10/02/2022    CREAT 0.6 10/02/2022    BUN 5 (L) 10/02/2022    GLU 104 (H) 10/02/2022       Lab  Results   Component Value Date    AST 21 10/01/2022    ALT 12 10/01/2022    TROPI <0.01 04/02/2020       Lab Results   Component Value Date    PT 11.3 10/01/2022    INR 1.0 10/01/2022       Lab Results   Component Value Date    COVID Nasal Swab 04/02/2020    COVID Detected (A) 04/02/2020       Rads:     Radiology Results (24 Hour)       ** No results found for the last 24 hours. **                         Relevant Problems   ENDO   (+) Hypothyroid               Anesthesia Plan    ASA 2     general                     intravenous induction   Detailed anesthesia plan: general IV      Post Op: plan for postoperative opioid use    Post op pain management: PO analgesics    informed consent obtained      pertinent labs reviewed               Signed by: Andreas Newport, MD 10/05/22 1:06 PM

## 2022-10-05 NOTE — Plan of Care (Signed)
Problem: Pain interferes with ability to perform ADL  Goal: Pain at adequate level as identified by patient  10/05/2022 1006 by Dallas Breeding, RN  Outcome: Progressing  Flowsheets (Taken 10/05/2022 1006)  Pain at adequate level as identified by patient:   Identify patient comfort function goal   Assess for risk of opioid induced respiratory depression, including snoring/sleep apnea. Alert healthcare team of risk factors identified.   Assess pain on admission, during daily assessment and/or before any "as needed" intervention(s)   Reassess pain within 30-60 minutes of any procedure/intervention, per Pain Assessment, Intervention, Reassessment (AIR) Cycle   Evaluate if patient comfort function goal is met   Evaluate patient's satisfaction with pain management progress   Offer non-pharmacological pain management interventions   Consult/collaborate with Pain Service   Include patient/patient care companion in decisions related to pain management as needed   Consult/collaborate with Physical Therapy, Occupational Therapy, and/or Speech Therapy  10/05/2022 1006 by Dallas Breeding, RN  Outcome: Progressing  10/05/2022 1005 by Dallas Breeding, RN  Outcome: Progressing  Flowsheets (Taken 10/05/2022 1005)  Pain at adequate level as identified by patient:   Identify patient comfort function goal   Assess for risk of opioid induced respiratory depression, including snoring/sleep apnea. Alert healthcare team of risk factors identified.   Assess pain on admission, during daily assessment and/or before any "as needed" intervention(s)   Reassess pain within 30-60 minutes of any procedure/intervention, per Pain Assessment, Intervention, Reassessment (AIR) Cycle   Evaluate if patient comfort function goal is met   Evaluate patient's satisfaction with pain management progress   Offer non-pharmacological pain management interventions   Consult/collaborate with Pain Service   Consult/collaborate with Physical Therapy, Occupational  Therapy, and/or Speech Therapy   Include patient/patient care companion in decisions related to pain management as needed     Problem: Side Effects from Pain Analgesia  Goal: Patient will experience minimal side effects of analgesic therapy  10/05/2022 1006 by Dallas Breeding, RN  Outcome: Progressing  10/05/2022 1006 by Dallas Breeding, RN  Outcome: Progressing  10/05/2022 1005 by Dallas Breeding, RN  Outcome: Progressing     Problem: Moderate/High Fall Risk Score >5  Goal: Patient will remain free of falls  10/05/2022 1006 by Dallas Breeding, RN  Outcome: Progressing  10/05/2022 1006 by Dallas Breeding, RN  Outcome: Progressing  10/05/2022 1005 by Dallas Breeding, RN  Outcome: Progressing     Problem: Safety  Goal: Patient will be free from injury during hospitalization  10/05/2022 1006 by Dallas Breeding, RN  Outcome: Progressing  Flowsheets (Taken 10/05/2022 1006)  Patient will be free from injury during hospitalization:   Assess patient's risk for falls and implement fall prevention plan of care per policy   Provide and maintain safe environment   Use appropriate transfer methods   Ensure appropriate safety devices are available at the bedside   Include patient/ family/ care giver in decisions related to safety   Hourly rounding   Assess for patients risk for elopement and implement Elopement Risk Plan per policy   Provide alternative method of communication if needed (communication boards, writing)  10/05/2022 1006 by Dallas Breeding, RN  Outcome: Progressing  Flowsheets (Taken 10/05/2022 1006)  Patient will be free from injury during hospitalization:   Assess patient's risk for falls and implement fall prevention plan of care per policy   Provide and maintain safe environment   Use appropriate transfer methods   Ensure appropriate safety devices are available at the bedside   Include patient/ family/  care giver in decisions related to safety   Hourly rounding   Assess for patients risk for elopement and  implement Elopement Risk Plan per policy   Provide alternative method of communication if needed (communication boards, writing)  10/05/2022 1005 by Dallas Breeding, RN  Outcome: Progressing  Flowsheets (Taken 10/05/2022 1005)  Patient will be free from injury during hospitalization:   Assess patient's risk for falls and implement fall prevention plan of care per policy   Provide and maintain safe environment   Use appropriate transfer methods   Ensure appropriate safety devices are available at the bedside   Include patient/ family/ care giver in decisions related to safety   Hourly rounding   Assess for patients risk for elopement and implement Elopement Risk Plan per policy   Provide alternative method of communication if needed (communication boards, writing)     Problem: Fluid and Electrolyte Imbalance/ Endocrine  Goal: Fluid and electrolyte balance are achieved/maintained  10/05/2022 1006 by Dallas Breeding, RN  Outcome: Progressing  Flowsheets (Taken 10/05/2022 1006)  Fluid and electrolyte balance are achieved/maintained:   Monitor/assess lab values and report abnormal values   Assess and reassess fluid and electrolyte status   Observe for cardiac arrhythmias   Monitor for muscle weakness  10/05/2022 1006 by Dallas Breeding, RN  Outcome: Progressing  10/05/2022 1005 by Dallas Breeding, RN  Outcome: Progressing     Problem: Compromised Sensory Perception  Goal: Sensory Perception Interventions  10/05/2022 1006 by Dallas Breeding, RN  Outcome: Progressing  10/05/2022 1006 by Dallas Breeding, RN  Outcome: Progressing  10/05/2022 1005 by Dallas Breeding, RN  Outcome: Progressing     Problem: Compromised Activity/Mobility  Goal: Activity/Mobility Interventions  10/05/2022 1006 by Dallas Breeding, RN  Outcome: Progressing  10/05/2022 1006 by Dallas Breeding, RN  Outcome: Progressing  10/05/2022 1005 by Dallas Breeding, RN  Outcome: Progressing     Problem: Compromised Nutrition  Goal: Nutrition  Interventions  10/05/2022 1006 by Dallas Breeding, RN  Outcome: Progressing  10/05/2022 1006 by Dallas Breeding, RN  Outcome: Progressing  10/05/2022 1005 by Dallas Breeding, RN  Outcome: Progressing     Problem: Altered GI Function  Goal: Fluid and electrolyte balance are achieved/maintained  10/05/2022 1006 by Dallas Breeding, RN  Outcome: Progressing  Flowsheets (Taken 10/05/2022 1006)  Fluid and electrolyte balance are achieved/maintained:   Monitor/assess lab values and report abnormal values   Assess and reassess fluid and electrolyte status   Observe for cardiac arrhythmias   Monitor for muscle weakness  10/05/2022 1006 by Dallas Breeding, RN  Outcome: Progressing  10/05/2022 1005 by Dallas Breeding, RN  Outcome: Progressing  Goal: Elimination patterns are normal or improving  10/05/2022 1006 by Dallas Breeding, RN  Outcome: Progressing  10/05/2022 1006 by Dallas Breeding, RN  Outcome: Progressing

## 2022-10-06 ENCOUNTER — Encounter: Payer: Self-pay | Admitting: Gastroenterology

## 2022-10-06 LAB — BASIC METABOLIC PANEL
Anion Gap: 7 (ref 5.0–15.0)
BUN: 5 mg/dL — ABNORMAL LOW (ref 7–21)
CO2: 25 mEq/L (ref 17–29)
Calcium: 8 mg/dL — ABNORMAL LOW (ref 8.5–10.5)
Chloride: 107 mEq/L (ref 99–111)
Creatinine: 0.6 mg/dL (ref 0.4–1.0)
GFR: >60 mL/min/{1.73_m2} (ref >=60.0)
Glucose: 91 mg/dL (ref 70–100)
Potassium: 3.2 mEq/L — ABNORMAL LOW (ref 3.5–5.3)
Sodium: 139 mEq/L (ref 135–145)

## 2022-10-06 LAB — FECAL FAT, QUALITATIVE: Fecal Fat, Qualitative: NORMAL

## 2022-10-06 LAB — MAGNESIUM: Magnesium: 1.7 mg/dL (ref 1.6–2.6)

## 2022-10-06 LAB — CULTURE, THROAT

## 2022-10-06 MED ORDER — ZOLPIDEM TARTRATE 5 MG PO TABS
5.0000 mg | ORAL_TABLET | Freq: Every evening | ORAL | Status: DC | PRN
Start: 2022-10-06 — End: 2022-10-09
  Administered 2022-10-06: 5 mg via ORAL
  Filled 2022-10-06: qty 1

## 2022-10-06 MED ORDER — SODIUM CHLORIDE 0.9 % IV SOLN
12.5000 mg | Freq: Four times a day (QID) | INTRAVENOUS | Status: DC | PRN
Start: 2022-10-06 — End: 2022-10-09
  Administered 2022-10-06 – 2022-10-07 (×2): 12.5 mg via INTRAVENOUS
  Filled 2022-10-06 (×2): qty 12.5

## 2022-10-06 MED ORDER — LOPERAMIDE HCL 2 MG PO CAPS
4.0000 mg | ORAL_CAPSULE | Freq: Four times a day (QID) | ORAL | Status: DC | PRN
Start: 2022-10-06 — End: 2022-10-09

## 2022-10-06 NOTE — Consults (Signed)
Home Health Referral    Referral from [facility]) for home health care upon discharge.            By [employer], the patient has the right to freely choose a home care provider.  Arrangements have been made with:    A company of the patients choosing. We have supplied the patient with a listing of providers in your area who asked to be included and participate in Medicare.  The preferred provider of your insurance company. Choosing a home care provider other than your insurance company's preferred provider may affect your insurance coverage.    Home Health Discharge Information     Your doctor has ordered Skilled Nursing, Physical Therapy, and Occupational Therapy in-home service(s) for you while you recuperate at home, to assist you in the transition from hospital to home.    The agency that you or your representative chose to provide the service:  Name of Home Health Agency Placement: [facility]] [phone]    [employer]:  Name of DME Agency: [facility] (Rolling Walker)]delivered to bedside 10/06/2022    The above services were set up by:  Clenton Pare, RN  [facility] Health Liaison)   Phone    [phone]    Additional comments:   If you have not heard from your home health agency within 24-48 hours after discharge please call your agency to arrange a time for your first visit.  For any scheduling concerns or questions related to home health, such as time or date please contact your home health agency at the number listed above.        Home Health face-to-face (FTF) Encounter (Order [id number])  Consult  Date: 10/06/2022 Department: [facility] Ordering/Authorizing: Zada Finders, MD     Order Information    Order Date/Time Release Date/Time Start Date/Time End Date/Time   10/06/22 12:45 PM None 10/06/22 12:40 PM 10/06/22 12:40 PM     Order Details    Frequency Duration Priority Order Class   Once 1  occurrence Routine  Hospital Performed     Standing Order Information    Remaining Occurrences Interval Last Released     0/1 Once 10/06/2022              Provider Information    Ordering User Ordering Provider Authorizing Provider   Clenton Pare, RN Zada Finders, MD Zada Finders, MD   Attending Provider(s) Admitting Provider PCP   Garey Ham, DO; Ilda Mori, MD; Kavin Leech, MD; Zada Finders, MD Zada Finders, MD Hurley Cisco, MD     Verbal Order Info    Action Created on Order Mode Entered by Responsible Provider Signed by Signed on   Ordering 10/06/22 1245 Telephone with readback Clenton Pare, RN Zada Finders, MD             Comments    Home nursing required for skilled assessment including cardiopulmonary assessment and dietary education for disease management, and medication instruction. Home PT/OT required for gait and balance training, strengthening, mobility, fall prevention, and ADL training.      PCP Erlinda Hong, Prashanthi    1. Acute diffused Colitis     2. Possible viral URI      3. Recurrent intractable nausea, vomiting and diarrhea      4. Migraine headache     5. Generalized weakness medication.     6. Multiple sclerosis on monthly Kesimpt  7. Anemia: chronic     8. Hypothyroidism     9. Hypocalcemia     10. Overweight    Rolling walker needed >99 months    Dr Alinda Sierras  NPI: [id number]    Precautions  Weight Bearing Status: no restrictions  Fall Risks: High;Impaired balance/gait;Muscle weakness  Other Precautions: h/o MS     Medical Diagnosis: Colitis [K52.9]                Home Health face-to-face (FTF) Encounter: Patient Communication     Not Released  Not seen         Order Questions    Question Answer   Date I saw the patient face-to-face: 10/06/2022   Evidence this patient is homebound because: B.  Profound weakness, poor balance/unsteady gait d/t illness/treatment/procedure    C.  Decreased endurance, strength, ROM, cadence, safety/judgment during mobility    F.   Deconditioned due to advance disease process requiring assistance to leave home    G.  Fall risk due to impaired coordination, gait and decreased balance    I.  Restricted to home to decrease risk of infection    N.  Impaired mobility d/t pain, arthritis, weakness that compromises patient safety   Medical conditions that necessitate Home Health care: B.  Functional impairment due to recent hospitalization/procedure/treatment    C.  Risk for complication/infection/pain requiring follow up and monitoring    D.  Chronic illness & risk for re-hospitalization due to unstable disease status    E.  Exacerbation of disease requiring follow up monitoring    F.  New diagnosis & treatment requiring follow up monitoring and management    G.  High risk/complex medications requiring instruction and management    H.  Multiple new medications requiring management and monitoring   Per clinical findings, following services are medically necessary: Skilled Nursing    PT    OT    DME   Clinical findings that support the need for Skilled Nursing. SN will: C. Monitor for signs and symptoms of exacerbation of disease and management    D. Review medication reconciliation, manage and educate on use and side effects    G. Educate on new diagnosis, treatment & management to prevent re-hospitalization    H. Assess cardiopulmonary status and monitor for signs &symptoms of exacerbation    I.  Educate dietary and or fluid restrictions and weight management   Clinical findings that support the need for Physical Therapy. PT will A.  Evaluate and treat functional impairment and improve mobility    C.  Educate on weight bearing status, stair/gait training, balance & coordination    D.  Provide services to help restore function, mobility, and releive pain    E.  Educate on functional mobility; bed, chair, sit, stand and transfer activities    F.  Perform home safety assessment & develop safe in home exercise program    G.  Implement activities to  improve stance time, cadence & step length    H.  Educate on the safe use of assistive device/ durable medical equipment    I.  Instruct on restorative activities to restore ability to perform ADL   OT will provide assistance with: Home program to improve ability to perform ADLs    Restorative program to improve mobility and independence    Strategies to compensate for loss of function    Recovery and maintenance skills    Basic motor function and reasoning abilities   DME  Rolling Walker                    Process Instructions    Please select Home Care Services medically necessary.    Based on the above findings, I certify that this patient is confined to the home and needs intermittent skilled nursing care, physical therapry and / or speech therapy or continues to need occupational therapy. The patient is under my care, and I have initiated the establishment of the plan of care. This patient will be followed by a physician who will periodically review the plan of care.     Collection Information            Consult Order Info    ID Description Priority Start Date Start Time   [id number] Home Health face-to-face (FTF) Encounter Routine 10/06/2022 12:40 PM   Provider Specialty Referred to   ______________________________________ _____________________________________                         Verbal Order Info    Action Created on Order Mode Entered by Responsible Provider Signed by Signed on   Ordering 10/06/22 1245 Telephone with readback Jarold Motto Debbora Dus, RN Zada Finders, MD             Patient Information    Patient Name  Judy Freeman, Judy Freeman Legal Sex  Female DOB  June 14, 1975       Reprint Order Requisition    Home Health face-to-face (FTF) Encounter (Order #[id number]) on 10/06/22       Additional Information    Associated Reports External References   Priority and Order Details [employer]

## 2022-10-06 NOTE — Plan of Care (Signed)
Problem: Pain interferes with ability to perform ADL  Goal: Pain at adequate level as identified by patient  Outcome: Progressing  Flowsheets (Taken 10/05/2022 1006)  Pain at adequate level as identified by patient:   Identify patient comfort function goal   Assess for risk of opioid induced respiratory depression, including snoring/sleep apnea. Alert healthcare team of risk factors identified.   Assess pain on admission, during daily assessment and/or before any "as needed" intervention(s)   Reassess pain within 30-60 minutes of any procedure/intervention, per Pain Assessment, Intervention, Reassessment (AIR) Cycle   Evaluate if patient comfort function goal is met   Evaluate patient's satisfaction with pain management progress   Offer non-pharmacological pain management interventions   Consult/collaborate with Pain Service   Include patient/patient care companion in decisions related to pain management as needed   Consult/collaborate with Physical Therapy, Occupational Therapy, and/or Speech Therapy     Problem: Side Effects from Pain Analgesia  Goal: Patient will experience minimal side effects of analgesic therapy  Outcome: Progressing     Problem: Moderate/High Fall Risk Score >5  Goal: Patient will remain free of falls  Outcome: Progressing     Problem: Safety  Goal: Patient will be free from injury during hospitalization  Outcome: Progressing  Flowsheets (Taken 10/05/2022 1006)  Patient will be free from injury during hospitalization:   Assess patient's risk for falls and implement fall prevention plan of care per policy   Provide and maintain safe environment   Use appropriate transfer methods   Ensure appropriate safety devices are available at the bedside   Include patient/ family/ care giver in decisions related to safety   Hourly rounding   Assess for patients risk for elopement and implement Elopement Risk Plan per policy   Provide alternative method of communication if needed (communication boards,  writing)     Problem: Fluid and Electrolyte Imbalance/ Endocrine  Goal: Fluid and electrolyte balance are achieved/maintained  Outcome: Progressing  Flowsheets (Taken 10/05/2022 1006)  Fluid and electrolyte balance are achieved/maintained:   Monitor/assess lab values and report abnormal values   Assess and reassess fluid and electrolyte status   Observe for cardiac arrhythmias   Monitor for muscle weakness     Problem: Compromised Sensory Perception  Goal: Sensory Perception Interventions  Outcome: Progressing     Problem: Compromised Activity/Mobility  Goal: Activity/Mobility Interventions  Outcome: Progressing  Flowsheets (Taken 10/06/2022 1215)  Activity/Mobility Interventions: Pad bony prominences, TAP Seated positioning system when OOB, Promote PMP, Reposition q 2 hrs / turn clock, Offload heels     Problem: Compromised Nutrition  Goal: Nutrition Interventions  Outcome: Progressing  Flowsheets (Taken 10/06/2022 1215)  Nutrition Interventions: Discuss nutrition at Rounds, I&Os, Document % meal eaten, Daily weights     Problem: Altered GI Function  Goal: Fluid and electrolyte balance are achieved/maintained  Outcome: Progressing  Flowsheets (Taken 10/05/2022 1006)  Fluid and electrolyte balance are achieved/maintained:   Monitor/assess lab values and report abnormal values   Assess and reassess fluid and electrolyte status   Observe for cardiac arrhythmias   Monitor for muscle weakness  Goal: Elimination patterns are normal or improving  Outcome: Progressing

## 2022-10-06 NOTE — Progress Notes (Signed)
When writer went to pt's room to hang a new IV fluid bag, pt stated that her IV at [facility]  hurting when she is bending her hand and IV start beeping. Writer told her if its hurting, we need to d/ced it and start a new IV line since another existing IV at [facility] areas was leaking and not working. Pt stated that she does not want to start another new IV since she is hard to find veins. Its has been 4 times already to change Ivs and she does not want any more since the current one at R arm is working. But she want's some thing like PICC line instead of repeated trying. Writer stated her that it cannot be done soon and needed MD order for PICC line. Then, pt stated that the current IV line can be used for IV fl and ABX for tonight. L AC been D/ced by attending CT, rachel. Charge nurse aware of it.

## 2022-10-06 NOTE — Discharge Instr - AVS First Page (Addendum)
Reason for your Hospital Admission:  Chronic diarrhea      Instructions for after your discharge:  Follow with gastroenterologist in 1 week for biopsy result and further planning.      Home Health Discharge Information     Your doctor has ordered Skilled Nursing, Physical Therapy, and Occupational Therapy in-home service(s) for you while you recuperate at home, to assist you in the transition from hospital to home.    The agency that you or your representative chose to provide the service:  Name of Home Health Agency Placement: [facility] [phone]    [employer]:  Name of DME Agency: [facility]  [phone] Rolling Walker delivered to bedside 10/06/2022    The above services were set up by:  Clenton Pare, RN  [facility] Liaison)   Phone    [phone]    Additional comments:   If you have not heard from your home health agency within 24-48 hours after discharge please call your agency to arrange a time for your first visit.  For any scheduling concerns or questions related to home health, such as time or date please contact your home health agency at the number listed above.

## 2022-10-06 NOTE — PT Plan of Care Note (Signed)
[facility]   Physical Therapy Cancellation Note      Patient:  Judy Freeman MRN#:  [id number]  Unit:  [facility] MEDICAL SURGICAL Room/Bed:  S180/S180.A    10/06/2022  Time: Start Time: 1130       PT Cancellation: Visit  PT Visit Cancellation Reason: Patient/caregiver declines therapy at this time         7/12 PT attempted however pt sitting on commode with diarrhea and not willing to do PT until diarrhea is controlled.  MCL

## 2022-10-06 NOTE — Progress Notes (Signed)
[facility] Progress Note  Patient Info:   Date/Time: 10/06/2022 / 3:23 PM   Admit Date:10/01/2022  Patient Name:Judy Freeman   UVO:[id number]   PCP: Hurley Cisco, MD  Attending Physician:Brianne Maina, Drucilla Schmidt, MD  Assessment/Plan:   Judy Freeman is a 47 y.o. female came with the chief   complaint of Abdominal Pain and Emesis    Judy Freeman is a 47 y.o. female came with the chief  complaint of Abdominal Pain and Emesis  47 year old woman with MS, chronic intermittent N/V and diarrhea followed by GI as outpatient, migraine headache presented with c/o nausea, vomiting and diarrhea. CT suggestive diffused colitis     1. Acute diffused Colitis ruled out on endoscopy  Patient still have diarrhea suspected functional with increase transient time and diarrhea. GI ordered opium tincture , I added imodium as well. I will d/c antibiotics. Follow on chem 8 and cbc.     2. Possible viral URI  Continue supportive care     3. Recurrent intractable nausea, vomiting and diarrhea  secondary to colitis. Underlying cause appreciate gastroenterologist input we will follow-up on recommendation. Patient is n.p.o. now. Better recommendation per gastroenterologist after the sigmoidoscopy.     4. Migraine headache  Stable continue supportive care continue as needed     5. Generalized weakness medication.  History of MS will consult PT OT follow-up on recommendation.     6. Multiple sclerosis on monthly Kesimpt     7. Anemia: chronic  - hemoglobin: 9.8 (10/02/22) down from 11.3 (10/01/22), low 8.8 (04/25/21)  - mean corpuscular volume: 84 (10/02/22)  - est. baseline hemoglobin: 10.2 g/dL     8. Hypothyroidism  - holding home: levothyroxine 50 mcg po     9. Hypocalcemia  - calcium: 8.0 (10/06/22) down from 8.4 (10/02/22), corrected calcium: 7.7 mg/dL (07/01/40)  - magnesium: 1.7 (10/06/22) unchanged from 1.7 (10/02/22)  - holding home: cholecalciferol 0.25 mg 20000 u po  - completed: lactated ringers infusion iv     10. Overweight  - BMI: 28.8  (10/02/22)     11. Hypokalemia: mild to moderate  - creatinine clearance: 112, creatinine: 0.6 (10/06/22)  - magnesium: 1.7 (10/06/22)  - potassium: 3.2 (10/06/22) down from 3.8 (10/02/22)  - completed: lactated ringers infusion iv          DVT Prophylaxis:Medication VTE Prophylaxis Orders: enoxaparin (LOVENOX) syringe 40 mg  Mechanical VTE Prophylaxis Orders: Maintain sequential compression device   Central Line/Foley Catheter/PICC line status: None  Code Status: Full Code  Disposition:Planning to discharge patient to home  Type of Admission:Inpatient  Hospital Problems:     Active Hospital Problems    Diagnosis    Dehydration    Colitis     Subjective:   Chief Complaint:  Abdominal Pain and Emesis    10/06/22   Review of Systems   Constitutional:  Positive for malaise/fatigue.   HENT:  Negative for hearing loss and tinnitus.    Respiratory:  Negative for cough and hemoptysis.    Cardiovascular:  Negative for chest pain and palpitations.   Gastrointestinal:  Positive for abdominal pain, diarrhea and nausea.   Skin:  Negative for itching and rash.     Objective:     Vitals:    10/06/22 0117 10/06/22 0344 10/06/22 0811 10/06/22 1207   BP: 138/86 102/68 124/83 135/87   Pulse: 73 74 74 73   Resp: 18 18 17 17    Temp: 98.4 F (36.9 C) 98.1 F (36.7  C) 97.7 F (36.5 C) 98.6 F (37 C)   TempSrc: Oral Oral Oral Oral   SpO2: 100% 98% 97% 99%   Weight:       Height:         Physical Exam:   Physical Exam  Constitutional:       Appearance: She is ill-appearing.   Cardiovascular:      Heart sounds: Murmur heard.   Pulmonary:      Effort: No respiratory distress.      Breath sounds: No wheezing.       Current Medications      Scheduled Meds: PRN Meds:    cefTRIAXone, 1 g, Intravenous, Q24H  enoxaparin, 40 mg, Subcutaneous, Daily  fluticasone, 1 spray, Each Nare, Daily  lactobacillus/streptococcus, 1 capsule, Oral, Daily  metroNIDAZOLE, 500 mg, Intravenous, Q8H        Continuous Infusions:   sodium chloride 125 mL/hr at 10/06/22  3295    acetaminophen, 650 mg, Q6H PRN  benzocaine-menthol, 1 lozenge, Q2H PRN  benzonatate, 100 mg, TID PRN  carboxymethylcellulose sodium, 1 drop, TID PRN  dextrose, 15 g of glucose, PRN   Or  dextrose, 12.5 g, PRN   Or  dextrose, 12.5 g, PRN   Or  glucagon (rDNA), 1 mg, PRN  HYDROcodone-acetaminophen, 1 tablet, Q6H PRN  loperamide, 4 mg, 4X Daily PRN  magnesium sulfate, 1 g, PRN  melatonin, 3 mg, QHS PRN  morphine, 2 mg, Q4H PRN  naloxone, 0.2 mg, PRN  ondansetron, 4 mg, Q6H PRN  opium, 0.6 mL, Q4H PRN  potassium & sodium phosphates, 2 packet, PRN  potassium chloride, 0-40 mEq, PRN   And  potassium chloride, 10 mEq, PRN  saline, 2 spray, Q4H PRN  zolpidem, 5 mg, QHS PRN          Results of Labs/imaging   Labs for the last 24 hours have been reviewed:   Coagulation Profile:   Recent Labs   Lab 10/01/22  2235   PT 11.3   INR 1.0       CBC review:   Recent Labs   Lab 10/02/22  0332 10/01/22  2235   WBC 4.53 3.78   Hemoglobin 9.8* 11.3*   Hematocrit 31.1* 34.0*   Platelet Count 290 299   MCV 84.1 81.3   RDW 14 14   Neutrophils %  --  71.1   Lymphocytes %  --  19.0   Eosinophils %  --  2.4   Immature Granulocytes %  --  0.3   Absolute Neutrophils  --  2.69   Absolute Immature Granulocytes  --  0.01     Chem Review:  Recent Labs   Lab 10/06/22  0630 10/02/22  0332 10/01/22  2235   Sodium 139 137 136   Potassium 3.2* 3.8 4.0   Chloride 107 107 104   CO2 25 22 18    BUN 5* 5* 6*   Creatinine 0.6 0.6 0.7   Glucose 91 104* 131*   Calcium 8.0* 8.4* 9.8   Magnesium 1.7 1.7 1.8   Bilirubin, Total  --   --  0.7   AST (SGOT)  --   --  21   ALT  --   --  12   Alkaline Phosphatase  --   --  63     Results       Procedure Component Value Units Date/Time    Culture, Blood, Aerobic And Anaerobic [[id number]] Collected: 10/02/22 0113    Specimen:  Blood, Venous Updated: 10/06/22 0900     Culture Blood No growth at 4 days    Culture, Throat [[id number]] Collected: 10/03/22 1732    Specimen: Swab from Throat Updated: 10/06/22 0744      Culture Throat No beta hemolytic Streptococcus groups A, C or G    Basic Metabolic Panel [[id number]]  (Abnormal) Collected: 10/06/22 0630    Specimen: Blood, Venous Updated: 10/06/22 0716     Glucose 91 mg/dL      BUN 5 mg/dL      Creatinine 0.6 mg/dL      Calcium 8.0 mg/dL      Sodium 086 mEq/L      Potassium 3.2 mEq/L      Chloride 107 mEq/L      CO2 25 mEq/L      Anion Gap 7.0     GFR >60.0 mL/min/1.73 m2     Magnesium [[id number]]  (Normal) Collected: 10/06/22 0630    Specimen: Blood, Venous Updated: 10/06/22 0716     Magnesium 1.7 mg/dL     Culture, Blood, Aerobic And Anaerobic [[id number]] Collected: 10/01/22 2301    Specimen: Blood, Venous Updated: 10/06/22 0500     Culture Blood No growth at 4 days          Radiology reports have been reviewed:  Radiology Results (24 Hour)       ** No results found for the last 24 hours. **          Chest AP Portable    Result Date: 10/02/2022  HISTORY: Sepsis COMPARISON: 06/23/2022. FINDINGS: LUNGS: No consolidation or edema. PLEURA: No pleural effusions or pneumothorax. HEART AND MEDIASTINUM:  No cardiac enlargement. BONES:  Suboptimally evaluated.     No acute abnormality. Johnsie Kindred, MD 10/02/2022 12:33 AM    CT Abd/Pelvis with IV Contrast    Result Date: 10/02/2022  HISTORY: Abdominal pain, nausea, vomiting, diarrhea. COMPARISON: None available. TECHNIQUE: CT abdomen and pelvis WITH intravenous contrast. The following dose reduction techniques were utilized: Automated exposure control and/or adjustment of the mA and/or kV according to patient size, and the use of iterative reconstruction technique. CONTRAST: iohexol (OMNIPAQUE) 350 MG/ML injection 100 mL. FINDINGS: LOWER THORAX: Status post cholecystectomy. No biliary dilation. LIVER/BILIARY TREE: No mass or intrahepatic biliary dilatation. No gallbladder distension or calcified gallstones. SPLEEN: No splenomegaly. PANCREAS: No pancreatic mass or duct dilatation. KIDNEYS/URETERS: No hydronephrosis, stones or solid mass  lesions. ADRENALS: No adrenal mass. PELVIC ORGANS/BLADDER: No pelvic masses. PERITONEUM/RETROPERITONEUM: No free air or fluid. LYMPH NODES: No lymphadenopathy. VESSELS: No aortic aneurysm. GI TRACT: Moderate apparent wall thickening of the entire colon, favor this is due to underdistention. Acute colitis cannot be excluded. No bowel obstruction. Normal appendix. BONES AND SOFT TISSUES: Unremarkable.     Moderate apparent wall thickening of the entire colon, favor this is due to underdistention. Acute colitis cannot be excluded. Johnsie Kindred, MD 10/02/2022 12:06 AM    Pathology:   Specimens (From admission, onward)       Start     Ordered    10/05/22 1326  Surgical Pathology  RELEASE UPON ORDERING        Question Answer Comment   Specimen Type Tissue    Release to patient Immediate        10/05/22 1326    10/05/22 1324  Surgical Pathology  RELEASE UPON ORDERING,   Status:  Canceled        Question Answer Comment   Specimen Type Tissue    Release to  patient Immediate        10/05/22 1324                  Last EKG Result       None          Hospitalist   Signed by:   Zada Finders, MD  10/06/2022 3:23 PM      *This note was generated by the Epic EMR system/ Dragon speech recognition and may contain inherent errors or omissions not intended by the user. Grammatical errors, random word insertions, deletions, pronoun errors and incomplete sentences are occasional consequences of this technology due to software limitations. Not all errors are caught or corrected. If there are questions or concerns about the content of this note or information contained within the body of this dictation they should be addressed directly with the author for clarification

## 2022-10-06 NOTE — Plan of Care (Signed)
Problem: Pain interferes with ability to perform ADL  Goal: Pain at adequate level as identified by patient  Outcome: Progressing     Problem: Side Effects from Pain Analgesia  Goal: Patient will experience minimal side effects of analgesic therapy  Outcome: Progressing     Problem: Moderate/High Fall Risk Score >5  Goal: Patient will remain free of falls  Outcome: Progressing     Problem: Safety  Goal: Patient will be free from injury during hospitalization  Outcome: Progressing     Problem: Fluid and Electrolyte Imbalance/ Endocrine  Goal: Fluid and electrolyte balance are achieved/maintained  Outcome: Progressing     Problem: Compromised Sensory Perception  Goal: Sensory Perception Interventions  Outcome: Progressing     Problem: Compromised Activity/Mobility  Goal: Activity/Mobility Interventions  Outcome: Progressing     Problem: Compromised Nutrition  Goal: Nutrition Interventions  Outcome: Progressing     Problem: Altered GI Function  Goal: Fluid and electrolyte balance are achieved/maintained  Outcome: Progressing  Goal: Elimination patterns are normal or improving  Outcome: Progressing

## 2022-10-06 NOTE — OT Progress Note (Signed)
[facility]   Occupational Therapy Cancel Note      Patient:  Judy Freeman MRN#:  [id number]  Unit:  [facility] MEDICAL SURGICAL Room/Bed:  S180/S180.A    10/06/2022  Time: Start Time: 1140       OT Cancellation: Visit  OT Visit Cancellation Reason: Patient/caregiver declines therapy at this time. Pt states that she has nausea and stomach upset and requested to rest. OT will continue to follow.           [place] ,OTR/L,CEIM  [facility]  Physical Medicine and Rehab Department

## 2022-10-06 NOTE — Progress Notes (Signed)
DME Referral  [employer]  Main Number [phone]  Fax [phone]  [place] cell [phone]      Pt accepted standard walker and signed ticket. Pt's choice given per pt's insurance coverage.  [place] [employer] RN aware of delivery.

## 2022-10-07 DIAGNOSIS — R1084 Generalized abdominal pain: Secondary | ICD-10-CM

## 2022-10-07 LAB — CBC
Absolute nRBC: 0 10*3/uL (ref <=0.00)
Hematocrit: 26.1 % — ABNORMAL LOW (ref 34.7–43.7)
Hemoglobin: 8.3 g/dL — ABNORMAL LOW (ref 11.4–14.8)
MCH: 26.5 pg (ref 25.1–33.5)
MCHC: 31.8 g/dL (ref 31.5–35.8)
MCV: 83.4 fL (ref 78.0–96.0)
MPV: 11.5 fL (ref 8.9–12.5)
Platelet Count: 234 10*3/uL (ref 142–346)
RBC: 3.13 10*6/uL — ABNORMAL LOW (ref 3.90–5.10)
RDW: 14 % (ref 11–15)
WBC: 3.76 10*3/uL (ref 3.10–9.50)
nRBC %: 0 /100 WBC (ref <=0.0)

## 2022-10-07 LAB — BASIC METABOLIC PANEL
Anion Gap: 6 (ref 5.0–15.0)
BUN: <3 mg/dL — ABNORMAL LOW (ref 7–21)
CO2: 25 mEq/L (ref 17–29)
Calcium: 8.2 mg/dL — ABNORMAL LOW (ref 8.5–10.5)
Chloride: 109 mEq/L (ref 99–111)
Creatinine: 0.6 mg/dL (ref 0.4–1.0)
GFR: >60 mL/min/{1.73_m2} (ref >=60.0)
Glucose: 81 mg/dL (ref 70–100)
Potassium: 3.3 mEq/L — ABNORMAL LOW (ref 3.5–5.3)
Sodium: 140 mEq/L (ref 135–145)

## 2022-10-07 LAB — CULTURE BLOOD AEROBIC AND ANAEROBIC
Culture Blood: NO GROWTH
Culture Blood: NO GROWTH

## 2022-10-07 MED ORDER — FLUCONAZOLE 100 MG PO TABS
100.0000 mg | ORAL_TABLET | Freq: Once | ORAL | Status: AC
Start: 2022-10-07 — End: 2022-10-07
  Administered 2022-10-07: 100 mg via ORAL
  Filled 2022-10-07: qty 1

## 2022-10-07 MED ORDER — POTASSIUM CHLORIDE CRYS ER 20 MEQ PO TBCR
20.0000 meq | EXTENDED_RELEASE_TABLET | Freq: Once | ORAL | Status: AC
Start: 2022-10-07 — End: 2022-10-07
  Administered 2022-10-07: 20 meq via ORAL
  Filled 2022-10-07: qty 1

## 2022-10-07 NOTE — Progress Notes (Signed)
[facility] Progress Note  Patient Info:   Date/Time: 10/07/2022 / 2:46 PM   Admit Date:10/01/2022  Patient Name:Judy Freeman   BJY:[id number]   PCP: Hurley Cisco, MD  Attending Physician:Marlayna Bannister, Drucilla Schmidt, MD  Assessment/Plan:   Judy Freeman is a 47 y.o. female came with the chief   complaint of Abdominal Pain and Emesis  Known history of MS, chronic intermittent N/V and diarrhea followed by GI as outpatient, migraine headache presented with c/o nausea, vomiting and diarrhea. CT suggestive diffused colitis status post sigmoidoscopy which did not show any colitis antibiotic discontinued.  She is on opium tincture and Imodium.  Stool consistency improving but she still spends hours in the bathroom much.  She is does have electrolyte imbalances.  Hypokalemia will replete.  Will discuss with gastroenterologist about further plan inpatient versus outpatient.  For now we will consult nutritionist     1. Acute diffused Colitis ruled out on endoscopy  Patient still have diarrhea suspected functional with increase transient time and diarrhea. GI ordered opium tincture , I added imodium as well. I will d/c antibiotics. Follow on chem 8 and cbc.     2. Possible viral URI  Continue supportive care     3. Recurrent intractable nausea, vomiting and diarrhea  secondary to colitis. Underlying cause appreciate gastroenterologist input we will follow-up on recommendation. Patient is n.p.o. now. Better recommendation per gastroenterologist after the sigmoidoscopy.     4. Migraine headache  Stable continue supportive care continue as needed     5. Generalized weakness medication.  History of MS will consult PT OT follow-up on recommendation.     6. Multiple sclerosis on monthly Kesimpt     7. Anemia: acute on chronic, downtrending, normocytic  - hemoglobin: 8.3 (10/07/22) down from 9.8 (10/02/22)  - mean corpuscular volume: 83 (10/07/22)  - est. baseline hemoglobin: 9.7 g/dL     8. Hypothyroidism  - holding home: levothyroxine  50 mcg po     9. Hypocalcemia  - calcium: 8.2 (10/07/22) up from 8.0 (10/06/22), corrected calcium: 7.9 mg/dL (3/0/86)  - magnesium: 1.7 (10/06/22) unchanged from 1.7 (10/02/22)  - holding home: cholecalciferol 0.25 mg 20000 u po  - completed: lactated ringers infusion iv     10. Overweight  - BMI: 28.8 (10/02/22)     11. Hypokalemia: mild to moderate, uptrending  - creatinine clearance: 112, creatinine: 0.6 (10/07/22)  - magnesium: 1.7 (10/06/22)  - potassium: 3.3 (10/07/22) up from 3.2 (10/06/22)  - completed: lactated ringers infusion iv        DVT Prophylaxis:Medication VTE Prophylaxis Orders: enoxaparin (LOVENOX) syringe 40 mg  Mechanical VTE Prophylaxis Orders: Maintain sequential compression device   Central Line/Foley Catheter/PICC line status: None  Code Status: Full Code  Disposition:Planning to discharge patient to home  Type of Admission:Inpatient  Hospital Problems:     Active Hospital Problems    Diagnosis    Dehydration    Colitis     Subjective:   Chief Complaint:  Abdominal Pain and Emesis    10/07/22   Review of Systems   Constitutional:  Positive for malaise/fatigue. Negative for fever.   HENT:  Negative for hearing loss.    Eyes:  Negative for blurred vision and double vision.   Cardiovascular:  Negative for chest pain and palpitations.   Gastrointestinal:  Positive for nausea.   Skin:  Negative for itching and rash.     Objective:     Vitals:    10/07/22 5784 10/07/22 0348  10/07/22 0807 10/07/22 1224   BP: 118/77 133/86 122/72 125/74   Pulse: 70 67 69 78   Resp: 17 17 15 17    Temp: 98.2 F (36.8 C) 97.5 F (36.4 C) 97.9 F (36.6 C) 98.6 F (37 C)   TempSrc: Oral Oral Oral Oral   SpO2: 99% 98% 98% 100%   Weight:       Height:         Physical Exam:   Physical Exam  Constitutional:       Appearance: She is not ill-appearing.   HENT:      Left Ear: There is no impacted cerumen.   Cardiovascular:      Heart sounds: Murmur heard.      No friction rub. No gallop.   Pulmonary:      Effort: No respiratory  distress.      Breath sounds: No stridor. No rhonchi.   Musculoskeletal:      Cervical back: No rigidity.       Current Medications      Scheduled Meds: PRN Meds:    enoxaparin, 40 mg, Subcutaneous, Daily  fluticasone, 1 spray, Each Nare, Daily  lactobacillus/streptococcus, 1 capsule, Oral, Daily        Continuous Infusions:   sodium chloride 75 mL/hr at 10/07/22 0740    acetaminophen, 650 mg, Q6H PRN  benzocaine-menthol, 1 lozenge, Q2H PRN  benzonatate, 100 mg, TID PRN  carboxymethylcellulose sodium, 1 drop, TID PRN  dextrose, 15 g of glucose, PRN   Or  dextrose, 12.5 g, PRN   Or  dextrose, 12.5 g, PRN   Or  glucagon (rDNA), 1 mg, PRN  HYDROcodone-acetaminophen, 1 tablet, Q6H PRN  loperamide, 4 mg, 4X Daily PRN  magnesium sulfate, 1 g, PRN  melatonin, 3 mg, QHS PRN  morphine, 2 mg, Q4H PRN  naloxone, 0.2 mg, PRN  ondansetron, 4 mg, Q6H PRN  opium, 0.6 mL, Q4H PRN  potassium & sodium phosphates, 2 packet, PRN  potassium chloride, 0-40 mEq, PRN   And  potassium chloride, 10 mEq, PRN  promethazine (PHENERGAN) IVPB, 12.5 mg, Q6H PRN  saline, 2 spray, Q4H PRN  zolpidem, 5 mg, QHS PRN          Results of Labs/imaging   Labs for the last 24 hours have been reviewed:   Coagulation Profile:   Recent Labs   Lab 10/01/22  2235   PT 11.3   INR 1.0       CBC review:   Recent Labs   Lab 10/07/22  0613 10/02/22  0332 10/01/22  2235   WBC 3.76 4.53 3.78   Hemoglobin 8.3* 9.8* 11.3*   Hematocrit 26.1* 31.1* 34.0*   Platelet Count 234 290 299   MCV 83.4 84.1 81.3   RDW 14 14 14    Neutrophils %  --   --  71.1   Lymphocytes %  --   --  19.0   Eosinophils %  --   --  2.4   Immature Granulocytes %  --   --  0.3   Absolute Neutrophils  --   --  2.69   Absolute Immature Granulocytes  --   --  0.01     Chem Review:  Recent Labs   Lab 10/07/22  0613 10/06/22  0630 10/02/22  0332 10/01/22  2235   Sodium 140 139 137 136   Potassium 3.3* 3.2* 3.8 4.0   Chloride 109 107 107 104   CO2 25 25 22  18  BUN <3* 5* 5* 6*   Creatinine 0.6 0.6 0.6 0.7    Glucose 81 91 104* 131*   Calcium 8.2* 8.0* 8.4* 9.8   Magnesium  --  1.7 1.7 1.8   Bilirubin, Total  --   --   --  0.7   AST (SGOT)  --   --   --  21   ALT  --   --   --  12   Alkaline Phosphatase  --   --   --  63     Results       Procedure Component Value Units Date/Time    Culture, Blood, Aerobic And Anaerobic [[id number]] Collected: 10/02/22 0113    Specimen: Blood, Venous Updated: 10/07/22 0900     Culture Blood No growth at 5 days    Basic Metabolic Panel [[id number]]  (Abnormal) Collected: 10/07/22 0613    Specimen: Blood, Venous Updated: 10/07/22 0720     Glucose 81 mg/dL      BUN <3 mg/dL      Creatinine 0.6 mg/dL      Calcium 8.2 mg/dL      Sodium 951 mEq/L      Potassium 3.3 mEq/L      Chloride 109 mEq/L      CO2 25 mEq/L      Anion Gap 6.0     GFR >60.0 mL/min/1.73 m2     CBC without Differential [[id number]]  (Abnormal) Collected: 10/07/22 0613    Specimen: Blood, Venous Updated: 10/07/22 0655     WBC 3.76 x10 3/uL      Hemoglobin 8.3 g/dL      Hematocrit 01.6 %      Platelet Count 234 x10 3/uL      MPV 11.5 fL      RBC 3.13 x10 6/uL      MCV 83.4 fL      MCH 26.5 pg      MCHC 31.8 g/dL      RDW 14 %      nRBC % 0.0 /100 WBC      Absolute nRBC 0.00 x10 3/uL     Culture, Blood, Aerobic And Anaerobic [[id number]] Collected: 10/01/22 2301    Specimen: Blood, Venous Updated: 10/07/22 0500     Culture Blood No growth at 5 days    Fecal Fat, Qualitative [[id number]] Collected: 10/04/22 1748    Specimen: Stool Updated: 10/06/22 2158     Fecal Fat, Qualitative Normal          Radiology reports have been reviewed:  Radiology Results (24 Hour)       ** No results found for the last 24 hours. **          Chest AP Portable    Result Date: 10/02/2022  HISTORY: Sepsis COMPARISON: 06/23/2022. FINDINGS: LUNGS: No consolidation or edema. PLEURA: No pleural effusions or pneumothorax. HEART AND MEDIASTINUM:  No cardiac enlargement. BONES:  Suboptimally evaluated.     No acute abnormality. Johnsie Kindred, MD 10/02/2022 12:33  AM    CT Abd/Pelvis with IV Contrast    Result Date: 10/02/2022  HISTORY: Abdominal pain, nausea, vomiting, diarrhea. COMPARISON: None available. TECHNIQUE: CT abdomen and pelvis WITH intravenous contrast. The following dose reduction techniques were utilized: Automated exposure control and/or adjustment of the mA and/or kV according to patient size, and the use of iterative reconstruction technique. CONTRAST: iohexol (OMNIPAQUE) 350 MG/ML injection 100 mL. FINDINGS: LOWER THORAX: Status post cholecystectomy. No biliary dilation. LIVER/BILIARY TREE: No mass or  intrahepatic biliary dilatation. No gallbladder distension or calcified gallstones. SPLEEN: No splenomegaly. PANCREAS: No pancreatic mass or duct dilatation. KIDNEYS/URETERS: No hydronephrosis, stones or solid mass lesions. ADRENALS: No adrenal mass. PELVIC ORGANS/BLADDER: No pelvic masses. PERITONEUM/RETROPERITONEUM: No free air or fluid. LYMPH NODES: No lymphadenopathy. VESSELS: No aortic aneurysm. GI TRACT: Moderate apparent wall thickening of the entire colon, favor this is due to underdistention. Acute colitis cannot be excluded. No bowel obstruction. Normal appendix. BONES AND SOFT TISSUES: Unremarkable.     Moderate apparent wall thickening of the entire colon, favor this is due to underdistention. Acute colitis cannot be excluded. Johnsie Kindred, MD 10/02/2022 12:06 AM    Pathology:   Specimens (From admission, onward)       Start     Ordered    10/05/22 1326  Surgical Pathology  RELEASE UPON ORDERING        Question Answer Comment   Specimen Type Tissue    Release to patient Immediate        10/05/22 1326    10/05/22 1324  Surgical Pathology  RELEASE UPON ORDERING,   Status:  Canceled        Question Answer Comment   Specimen Type Tissue    Release to patient Immediate        10/05/22 1324                  Last EKG Result       None          Hospitalist   Signed by:   Zada Finders, MD      *This note was generated by the Epic EMR system/ Dragon speech  recognition and may contain inherent errors or omissions not intended by the user. Grammatical errors, random word insertions, deletions, pronoun errors and incomplete sentences are occasional consequences of this technology due to software limitations. Not all errors are caught or corrected. If there are questions or concerns about the content of this note or information contained within the body of this dictation they should be addressed directly with the author for clarification

## 2022-10-07 NOTE — UM Notes (Signed)
CSR, 7/12:    PATIENT NAME: Judy Freeman, Judy Freeman   DOB: 26-Nov-1975     Reason for Continued Stay:  47 Year old female admitted with acute diffused colitis    ROS:  Constitutional:  Positive for malaise/fatigue.   Gastrointestinal:  Positive for abdominal pain, diarrhea and nausea.     Vital Signs:      10/06/22 0117 10/06/22 0344 10/06/22 0811 10/06/22 1207   BP: 138/86 102/68 124/83 135/87   Pulse: 73 74 74 73   Resp: 18 18 17 17    Temp: 98.4 F (36.9 C) 98.1 F (36.7 C) 97.7 F (36.5 C) 98.6 F (37 C)   TempSrc: Oral Oral Oral Oral   SpO2: 100% 98% 97% 99%   Weight:               Labs:   Latest Reference Range & Units 10/06/22 06:30   Glucose 70 - 100 mg/dL 91   BUN 7 - 21 mg/dL 5 (L)   Creatinine 0.4 - 1.0 mg/dL 0.6   Sodium 295 - 621 mEq/L 139   Potassium 3.5 - 5.3 mEq/L 3.2 (L)   Chloride 99 - 111 mEq/L 107   CO2 17 - 29 mEq/L 25   Calcium 8.5 - 10.5 mg/dL 8.0 (L)   Anion Gap 5.0 - 15.0  7.0   EGFR >=60.0 mL/min/1.73 m2 >60.0   Magnesium 1.6 - 2.6 mg/dL 1.7   (L): Data is abnormally low    Plan of Care:  . Acute diffused Colitis ruled out on endoscopy  Patient still have diarrhea suspected functional with increase transient time and diarrhea. GI ordered opium tincture , I added imodium as well. I will d/c antibiotics. Follow on chem 8 and cbc.     2. Possible viral URI  Continue supportive care     3. Recurrent intractable nausea, vomiting and diarrhea  secondary to colitis. Underlying cause appreciate gastroenterologist input we will follow-up on recommendation. Patient is n.p.o. now. Better recommendation per gastroenterologist after the sigmoidoscopy.     4. Migraine headache  Stable continue supportive care continue as needed     5. Generalized weakness medication.  History of MS will consult PT OT follow-up on recommendation.     6. Multiple sclerosis on monthly Kesimpt     7. Anemia: chronic  - hemoglobin: 9.8 (10/02/22) down from 11.3 (10/01/22), low 8.8 (04/25/21)  - mean corpuscular volume: 84 (10/02/22)  -  est. baseline hemoglobin: 10.2 g/dL     8. Hypothyroidism  - holding home: levothyroxine 50 mcg po     9. Hypocalcemia  - calcium: 8.0 (10/06/22) down from 8.4 (10/02/22), corrected calcium: 7.7 mg/dL (3/0/86)  - magnesium: 1.7 (10/06/22) unchanged from 1.7 (10/02/22)  - holding home: cholecalciferol 0.25 mg 20000 u po  - completed: lactated ringers infusion iv     10. Overweight  - BMI: 28.8 (10/02/22)     11. Hypokalemia: mild to moderate  - creatinine clearance: 112, creatinine: 0.6 (10/06/22)  - magnesium: 1.7 (10/06/22)  - potassium: 3.2 (10/06/22) down from 3.8 (10/02/22)  - completed: lactated ringers infusion iv            DVT Prophylaxis:Medication VTE Prophylaxis Orders: enoxaparin (LOVENOX) syringe 40 mg  Mechanical VTE Prophylaxis Orders: Maintain sequential compression device   Central Line/Foley Catheter/PICC line status: None  Code Status: Full Code  Disposition:Planning to discharge patient to home  Type of Admission:Inpatient        Medications:  Scheduled Meds:  Current  Facility-Administered Medications   Medication Dose Route Frequency    enoxaparin  40 mg Subcutaneous Daily    fluticasone  1 spray Each Nare Daily    lactobacillus/streptococcus  1 capsule Oral Daily     Continuous Infusions:   sodium chloride 75 mL/hr at 10/06/22 1836     PRN Meds:.acetaminophen, benzocaine-menthol, benzonatate, carboxymethylcellulose sodium, dextrose **OR** dextrose **OR** dextrose **OR** glucagon (rDNA), HYDROcodone-acetaminophen, loperamide, magnesium sulfate, melatonin, morphine, naloxone, ondansetron, opium, potassium & sodium phosphates, potassium chloride **AND** potassium chloride, promethazine (PHENERGAN) IVPB, saline, zolpidem      PRN:  Zofran 4mg  IV X 1 dose    UTILIZATION REVIEW CONTACT: Gae Gallop RN, BSN  Utilization Review   [facility]  [address]  [address]  [place], [place] [address]  Phone: [phone] (Voice Mail Only)  Email: [email or web address]   NPI:    [id number]  Tax ID:  [id number]         NOTES TO REVIEWER:    This clinical review is based on/compiled from documentation provided by the treatment team within the patient's medical record.

## 2022-10-07 NOTE — Plan of Care (Signed)
Problem: Pain interferes with ability to perform ADL  Goal: Pain at adequate level as identified by patient  Outcome: Progressing  Flowsheets (Taken 10/05/2022 1006 by Dallas Breeding, RN)  Pain at adequate level as identified by patient:   Identify patient comfort function goal   Assess for risk of opioid induced respiratory depression, including snoring/sleep apnea. Alert healthcare team of risk factors identified.   Assess pain on admission, during daily assessment and/or before any "as needed" intervention(s)   Reassess pain within 30-60 minutes of any procedure/intervention, per Pain Assessment, Intervention, Reassessment (AIR) Cycle   Evaluate if patient comfort function goal is met   Evaluate patient's satisfaction with pain management progress   Offer non-pharmacological pain management interventions   Consult/collaborate with Pain Service   Include patient/patient care companion in decisions related to pain management as needed   Consult/collaborate with Physical Therapy, Occupational Therapy, and/or Speech Therapy     Problem: Safety  Goal: Patient will be free from injury during hospitalization  Outcome: Progressing  Flowsheets (Taken 10/05/2022 1006 by Dallas Breeding, RN)  Patient will be free from injury during hospitalization:   Assess patient's risk for falls and implement fall prevention plan of care per policy   Provide and maintain safe environment   Use appropriate transfer methods   Ensure appropriate safety devices are available at the bedside   Include patient/ family/ care giver in decisions related to safety   Hourly rounding   Assess for patients risk for elopement and implement Elopement Risk Plan per policy   Provide alternative method of communication if needed (communication boards, writing)     Problem: Fluid and Electrolyte Imbalance/ Endocrine  Goal: Fluid and electrolyte balance are achieved/maintained  Outcome: Progressing  Flowsheets (Taken 10/05/2022 1006 by Dallas Breeding,  RN)  Fluid and electrolyte balance are achieved/maintained:   Monitor/assess lab values and report abnormal values   Assess and reassess fluid and electrolyte status   Observe for cardiac arrhythmias   Monitor for muscle weakness     Problem: Compromised Activity/Mobility  Goal: Activity/Mobility Interventions  Outcome: Progressing  Flowsheets (Taken 10/07/2022 0840)  Activity/Mobility Interventions: Pad bony prominences, TAP Seated positioning system when OOB, Promote PMP, Reposition q 2 hrs / turn clock, Offload heels     Problem: Altered GI Function  Goal: Fluid and electrolyte balance are achieved/maintained  Outcome: Progressing  Flowsheets (Taken 10/05/2022 1006 by Dallas Breeding, RN)  Fluid and electrolyte balance are achieved/maintained:   Monitor/assess lab values and report abnormal values   Assess and reassess fluid and electrolyte status   Observe for cardiac arrhythmias   Monitor for muscle weakness

## 2022-10-08 LAB — BASIC METABOLIC PANEL
Anion Gap: 6 (ref 5.0–15.0)
BUN: 4 mg/dL — ABNORMAL LOW (ref 7–21)
CO2: 22 mEq/L (ref 17–29)
Calcium: 8.2 mg/dL — ABNORMAL LOW (ref 8.5–10.5)
Chloride: 110 mEq/L (ref 99–111)
Creatinine: 0.6 mg/dL (ref 0.4–1.0)
GFR: >60 mL/min/{1.73_m2} (ref >=60.0)
Glucose: 90 mg/dL (ref 70–100)
Potassium: 4.4 mEq/L (ref 3.5–5.3)
Sodium: 138 mEq/L (ref 135–145)

## 2022-10-08 LAB — MAGNESIUM: Magnesium: 1.7 mg/dL (ref 1.6–2.6)

## 2022-10-08 MED ORDER — POTASSIUM CHLORIDE CRYS ER 20 MEQ PO TBCR
20.0000 meq | EXTENDED_RELEASE_TABLET | Freq: Every day | ORAL | Status: DC
Start: 2022-10-08 — End: 2022-10-09
  Administered 2022-10-08 – 2022-10-09 (×2): 20 meq via ORAL
  Filled 2022-10-08: qty 1

## 2022-10-08 NOTE — Plan of Care (Signed)
Problem: Pain interferes with ability to perform ADL  Goal: Pain at adequate level as identified by patient  Outcome: Progressing  Flowsheets (Taken 10/08/2022 0005)  Pain at adequate level as identified by patient:   Identify patient comfort function goal   Assess for risk of opioid induced respiratory depression, including snoring/sleep apnea. Alert healthcare team of risk factors identified.   Assess pain on admission, during daily assessment and/or before any "as needed" intervention(s)   Reassess pain within 30-60 minutes of any procedure/intervention, per Pain Assessment, Intervention, Reassessment (AIR) Cycle   Evaluate if patient comfort function goal is met     Problem: Side Effects from Pain Analgesia  Goal: Patient will experience minimal side effects of analgesic therapy  Outcome: Progressing  Flowsheets (Taken 10/08/2022 0005)  Patient will experience minimal side effects of analgesic therapy:   Monitor/assess patient's respiratory status (RR depth, effort, breath sounds)   Assess for changes in cognitive function     Problem: Moderate/High Fall Risk Score >5  Goal: Patient will remain free of falls  Outcome: Progressing  Flowsheets (Taken 10/07/2022 2000)  Moderate Risk (6-13): LOW-Fall Interventions Appropriate for Low Fall Risk     Problem: Safety  Goal: Patient will be free from injury during hospitalization  Outcome: Progressing  Flowsheets (Taken 10/08/2022 0005)  Patient will be free from injury during hospitalization:   Assess patient's risk for falls and implement fall prevention plan of care per policy   Provide and maintain safe environment   Use appropriate transfer methods   Ensure appropriate safety devices are available at the bedside   Include patient/ family/ care giver in decisions related to safety   Hourly rounding     Problem: Fluid and Electrolyte Imbalance/ Endocrine  Goal: Fluid and electrolyte balance are achieved/maintained  Outcome: Progressing  Flowsheets (Taken 10/08/2022  0005)  Fluid and electrolyte balance are achieved/maintained:   Monitor/assess lab values and report abnormal values   Assess and reassess fluid and electrolyte status     Problem: Compromised Sensory Perception  Goal: Sensory Perception Interventions  Outcome: Progressing  Flowsheets (Taken 10/07/2022 2000)  Sensory Perception Interventions: Offload heels, Pad bony prominences, Reposition q 2hrs/turn Clock, Q2 hour skin assessment under devices if present     Problem: Compromised Activity/Mobility  Goal: Activity/Mobility Interventions  Outcome: Progressing  Flowsheets (Taken 10/07/2022 2000)  Activity/Mobility Interventions: Pad bony prominences, TAP Seated positioning system when OOB, Promote PMP, Reposition q 2 hrs / turn clock, Offload heels     Problem: Compromised Nutrition  Goal: Nutrition Interventions  Outcome: Progressing  Flowsheets (Taken 10/07/2022 2000)  Nutrition Interventions: Discuss nutrition at Rounds, I&Os, Document % meal eaten, Daily weights     Problem: Altered GI Function  Goal: Fluid and electrolyte balance are achieved/maintained  Outcome: Progressing  Flowsheets (Taken 10/08/2022 0005)  Fluid and electrolyte balance are achieved/maintained:   Monitor/assess lab values and report abnormal values   Assess and reassess fluid and electrolyte status

## 2022-10-08 NOTE — Plan of Care (Signed)
Problem: Safety  Goal: Patient will be free from injury during hospitalization  Outcome: Progressing  Flowsheets (Taken 10/08/2022 1208)  Patient will be free from injury during hospitalization:   Assess patient's risk for falls and implement fall prevention plan of care per policy   Use appropriate transfer methods   Hourly rounding   Provide and maintain safe environment     Problem: Fluid and Electrolyte Imbalance/ Endocrine  Goal: Fluid and electrolyte balance are achieved/maintained  Outcome: Progressing  Flowsheets (Taken 10/08/2022 1208)  Fluid and electrolyte balance are achieved/maintained:   Monitor/assess lab values and report abnormal values   Monitor for muscle weakness   Assess and reassess fluid and electrolyte status   Observe for cardiac arrhythmias     Problem: Altered GI Function  Goal: Fluid and electrolyte balance are achieved/maintained  Outcome: Progressing  Flowsheets (Taken 10/08/2022 1208)  Fluid and electrolyte balance are achieved/maintained:   Monitor/assess lab values and report abnormal values   Monitor for muscle weakness   Assess and reassess fluid and electrolyte status   Observe for cardiac arrhythmias

## 2022-10-08 NOTE — Progress Notes (Signed)
[facility]ITAL[facility] Progress Note  Patient Info:   Date/Time: 10/08/2022 / 12:43 PM   Admit Date:10/01/2022  Patient Name:Judy Freeman   ZOX:[id number]   PCP: Hurley Cisco, MD  Attending Physician:Radha Coggins, Drucilla Schmidt, MD  Assessment/Plan:   Judy Freeman is a 47 y.o. female came with the chief   complaint of Abdominal Pain and Emesis  Patient with known history of chronic diarrhea nausea and abdominal pain.  Status post flexible sigmoidoscopy no acute emergency.  Stool studies are negative.  Discontinue antibiotic.  Patient started on open culture responding somewhat.  I will discontinue IV fluid today electrolytes are fine.  I will follow-up on Chem-8 and magnesium level in the morning.  We can put her on daily supplemental potassium oral rehydration and regular diet. I consulted nutrition Follow-up on recommendation.  Will discuss with gastroenterologist in the morning and if no further inpatient workup or recommendations then we can discharge for further treatment management outpatient follow-up with gastroenterologist.     1. Acute diffused Colitis ruled out on endoscopy  Patient still have diarrhea suspected functional with increase transient time and diarrhea. GI ordered opium tincture , I added imodium as well. I will d/c antibiotics. Follow on chem 8 and cbc.     2. Possible viral URI  Continue supportive care     3. Recurrent intractable nausea, vomiting and diarrhea  secondary to colitis. Underlying cause appreciate gastroenterologist input we will follow-up on recommendation. Patient is n.p.o. now. Better recommendation per gastroenterologist after the sigmoidoscopy.     4. Migraine headache  Stable continue supportive care continue as needed     5. Generalized weakness medication.  History of MS will consult PT OT follow-up on recommendation.     6. Multiple sclerosis on monthly Kesimpt     7. Anemia: acute on chronic, downtrending, normocytic  - hemoglobin: 8.3 (10/07/22) down from 9.8 (10/02/22)  - mean  corpuscular volume: 83 (10/07/22)  - est. baseline hemoglobin: 9.7 g/dL     8. Hypothyroidism  - holding home: levothyroxine 50 mcg po     9. Hypocalcemia  - calcium: 8.2 (10/08/22) unchanged from 8.2 (10/07/22), low 8.0 (10/06/22), corrected calcium: 7.9 mg/dL (12/02/09)  - magnesium: 1.7 (10/08/22) unchanged from 1.7 (10/06/22)  - holding home: cholecalciferol 0.25 mg 20000 u po  - completed: lactated ringers infusion iv     10. Overweight  - BMI: 28.8 (10/02/22)     11. Hypokalemia: mild to moderate, resolved  - creatinine clearance: 112, creatinine: 0.6 (10/08/22)  - magnesium: 1.7 (10/08/22)  - potassium: 4.4 (10/08/22) up from 3.3 (10/07/22), low 3.2 (10/06/22)  - completed: lactated ringers infusion iv          DVT Prophylaxis:Medication VTE Prophylaxis Orders: enoxaparin (LOVENOX) syringe 40 mg  Mechanical VTE Prophylaxis Orders: Maintain sequential compression device   Central Line/Foley Catheter/PICC line status: None  Code Status: Full Code  Disposition:Planning to discharge patient to home  Type of Admission:Inpatient  Hospital Problems:     Active Hospital Problems    Diagnosis    Dehydration    Colitis     Subjective:   Chief Complaint:  Abdominal Pain and Emesis    10/08/22   Review of Systems   Constitutional:  Positive for malaise/fatigue.   HENT:  Negative for tinnitus.    Respiratory:  Negative for cough and hemoptysis.    Cardiovascular:  Negative for chest pain and palpitations.   Gastrointestinal:  Positive for abdominal pain and nausea.   Skin:  Negative for rash.     Objective:     Vitals:    10/08/22 0019 10/08/22 0536 10/08/22 0750 10/08/22 1142   BP: 125/87 108/62 111/75 133/85   Pulse: 69 72 62 66   Resp: 16 16     Temp: 98.4 F (36.9 C) 98.2 F (36.8 C) 98.6 F (37 C) 98.6 F (37 C)   TempSrc: Oral Oral Oral Oral   SpO2: 99% 97% 99% 99%   Weight:       Height:         Physical Exam:   Physical Exam  Constitutional:       Appearance: She is ill-appearing.   Cardiovascular:      Heart sounds: Murmur  heard.      No gallop.   Pulmonary:      Effort: No respiratory distress.      Breath sounds: Rales present. No wheezing.       Current Medications      Scheduled Meds: PRN Meds:    enoxaparin, 40 mg, Subcutaneous, Daily  fluticasone, 1 spray, Each Nare, Daily  lactobacillus/streptococcus, 1 capsule, Oral, Daily  potassium chloride, 20 mEq, Oral, Daily        Continuous Infusions:   acetaminophen, 650 mg, Q6H PRN  benzocaine-menthol, 1 lozenge, Q2H PRN  benzonatate, 100 mg, TID PRN  carboxymethylcellulose sodium, 1 drop, TID PRN  dextrose, 15 g of glucose, PRN   Or  dextrose, 12.5 g, PRN   Or  dextrose, 12.5 g, PRN   Or  glucagon (rDNA), 1 mg, PRN  HYDROcodone-acetaminophen, 1 tablet, Q6H PRN  loperamide, 4 mg, 4X Daily PRN  magnesium sulfate, 1 g, PRN  melatonin, 3 mg, QHS PRN  morphine, 2 mg, Q4H PRN  naloxone, 0.2 mg, PRN  ondansetron, 4 mg, Q6H PRN  opium, 0.6 mL, Q4H PRN  potassium & sodium phosphates, 2 packet, PRN  potassium chloride, 0-40 mEq, PRN   And  potassium chloride, 10 mEq, PRN  promethazine (PHENERGAN) IVPB, 12.5 mg, Q6H PRN  saline, 2 spray, Q4H PRN  zolpidem, 5 mg, QHS PRN          Results of Labs/imaging   Labs for the last 24 hours have been reviewed:   Coagulation Profile:   Recent Labs   Lab 10/01/22  2235   PT 11.3   INR 1.0       CBC review:   Recent Labs   Lab 10/07/22  0613 10/02/22  0332 10/01/22  2235   WBC 3.76 4.53 3.78   Hemoglobin 8.3* 9.8* 11.3*   Hematocrit 26.1* 31.1* 34.0*   Platelet Count 234 290 299   MCV 83.4 84.1 81.3   RDW 14 14 14    Neutrophils %  --   --  71.1   Lymphocytes %  --   --  19.0   Eosinophils %  --   --  2.4   Immature Granulocytes %  --   --  0.3   Absolute Neutrophils  --   --  2.69   Absolute Immature Granulocytes  --   --  0.01     Chem Review:  Recent Labs   Lab 10/08/22  0651 10/07/22  0613 10/06/22  0630 10/02/22  0332 10/01/22  2235   Sodium 138 140 139 137 136   Potassium 4.4 3.3* 3.2* 3.8 4.0   Chloride 110 109 107 107 104   CO2 22 25 25 22 18    BUN  4* <3* 5*  5* 6*   Creatinine 0.6 0.6 0.6 0.6 0.7   Glucose 90 81 91 104* 131*   Calcium 8.2* 8.2* 8.0* 8.4* 9.8   Magnesium 1.7  --  1.7 1.7 1.8   Bilirubin, Total  --   --   --   --  0.7   AST (SGOT)  --   --   --   --  21   ALT  --   --   --   --  12   Alkaline Phosphatase  --   --   --   --  63     Results       Procedure Component Value Units Date/Time    Basic Metabolic Panel [[id number]]  (Abnormal) Collected: 10/08/22 0651    Specimen: Blood, Venous Updated: 10/08/22 0738     Glucose 90 mg/dL      BUN 4 mg/dL      Creatinine 0.6 mg/dL      Calcium 8.2 mg/dL      Sodium 132 mEq/L      Potassium 4.4 mEq/L      Chloride 110 mEq/L      CO2 22 mEq/L      Anion Gap 6.0     GFR >60.0 mL/min/1.73 m2     Magnesium [[id number]]  (Normal) Collected: 10/08/22 0651    Specimen: Blood, Venous Updated: 10/08/22 0738     Magnesium 1.7 mg/dL           Radiology reports have been reviewed:  Radiology Results (24 Hour)       ** No results found for the last 24 hours. **          Chest AP Portable    Result Date: 10/02/2022  HISTORY: Sepsis COMPARISON: 06/23/2022. FINDINGS: LUNGS: No consolidation or edema. PLEURA: No pleural effusions or pneumothorax. HEART AND MEDIASTINUM:  No cardiac enlargement. BONES:  Suboptimally evaluated.     No acute abnormality. Johnsie Kindred, MD 10/02/2022 12:33 AM    CT Abd/Pelvis with IV Contrast    Result Date: 10/02/2022  HISTORY: Abdominal pain, nausea, vomiting, diarrhea. COMPARISON: None available. TECHNIQUE: CT abdomen and pelvis WITH intravenous contrast. The following dose reduction techniques were utilized: Automated exposure control and/or adjustment of the mA and/or kV according to patient size, and the use of iterative reconstruction technique. CONTRAST: iohexol (OMNIPAQUE) 350 MG/ML injection 100 mL. FINDINGS: LOWER THORAX: Status post cholecystectomy. No biliary dilation. LIVER/BILIARY TREE: No mass or intrahepatic biliary dilatation. No gallbladder distension or calcified gallstones. SPLEEN:  No splenomegaly. PANCREAS: No pancreatic mass or duct dilatation. KIDNEYS/URETERS: No hydronephrosis, stones or solid mass lesions. ADRENALS: No adrenal mass. PELVIC ORGANS/BLADDER: No pelvic masses. PERITONEUM/RETROPERITONEUM: No free air or fluid. LYMPH NODES: No lymphadenopathy. VESSELS: No aortic aneurysm. GI TRACT: Moderate apparent wall thickening of the entire colon, favor this is due to underdistention. Acute colitis cannot be excluded. No bowel obstruction. Normal appendix. BONES AND SOFT TISSUES: Unremarkable.     Moderate apparent wall thickening of the entire colon, favor this is due to underdistention. Acute colitis cannot be excluded. Johnsie Kindred, MD 10/02/2022 12:06 AM    Pathology:   Specimens (From admission, onward)       Start     Ordered    10/05/22 1326  Surgical Pathology  RELEASE UPON ORDERING        Question Answer Comment   Specimen Type Tissue    Release to patient Immediate        10/05/22 1326  10/05/22 1324  Surgical Pathology  RELEASE UPON ORDERING,   Status:  Canceled        Question Answer Comment   Specimen Type Tissue    Release to patient Immediate        10/05/22 1324                  Last EKG Result       None          Hospitalist   Signed by:   Zada Finders, MD  10/08/2022 12:43 PM      *This note was generated by the Epic EMR system/ Dragon speech recognition and may contain inherent errors or omissions not intended by the user. Grammatical errors, random word insertions, deletions, pronoun errors and incomplete sentences are occasional consequences of this technology due to software limitations. Not all errors are caught or corrected. If there are questions or concerns about the content of this note or information contained within the body of this dictation they should be addressed directly with the author for clarification

## 2022-10-08 NOTE — Progress Notes (Signed)
NURSE NOTE SUMMARY  [facility] - [facility]   Patient Name: Judy Freeman, Judy Freeman   Attending Physician: Zada Finders, MD   Today's date:   10/08/2022 LOS: 3 days   Shift Summary:                                                              pt requested a 1x dose of diflucan, per pt she has a history of yeast infections and when she starts getting the symptoms her PCP prescribes a one time dose of diflucan. pt is s/p sigmoidoscopy 7/12, and after the procedure the pt had a BM which led to stool getting into her vagina. pt stated they started feeling the symptoms today. Hospitalist contacted, 1 x dose ordered and given     Provider Notifications:        Rapid Response Notifications:  Mobility:      PMP Activity: Step 6 - Walks in Room (10/07/2022  8:00 PM)     Weight tracking:  Family Dynamic:   No data found.          Last Bowel Movement   Last BM Date: 10/07/22

## 2022-10-08 NOTE — Nursing Progress Note (Addendum)
Alert and oriented, resting in the room with family at bedside. Prior meals, administered zofran and opium x2  with positive effects; tolerating meals without any N/V. Denies of any pain. 1BM this shift. AM magnesium 1.7 - electrolyte replaced. Was able to ambulate around the unit without any difficulties, pt is "feeling good". Will continue to monitor.

## 2022-10-09 LAB — BASIC METABOLIC PANEL
Anion Gap: 5 (ref 5.0–15.0)
BUN: 3 mg/dL — ABNORMAL LOW (ref 7–21)
CO2: 25 mEq/L (ref 17–29)
Calcium: 8.8 mg/dL (ref 8.5–10.5)
Chloride: 106 mEq/L (ref 99–111)
Creatinine: 0.7 mg/dL (ref 0.4–1.0)
GFR: >60 mL/min/{1.73_m2} (ref >=60.0)
Glucose: 112 mg/dL — ABNORMAL HIGH (ref 70–100)
Potassium: 4.8 mEq/L (ref 3.5–5.3)
Sodium: 136 mEq/L (ref 135–145)

## 2022-10-09 MED ORDER — OPIUM 10 MG/ML (1%) PO TINC
0.6000 mL | ORAL | 0 refills | Status: AC | PRN
Start: 2022-10-09 — End: ?

## 2022-10-09 MED ORDER — OPIUM 10 MG/ML (1%) PO TINC
0.6000 mL | ORAL | 0 refills | Status: DC | PRN
Start: 2022-10-09 — End: 2022-10-09

## 2022-10-09 MED ORDER — RISAQUAD PO CAPS
1.0000 | ORAL_CAPSULE | Freq: Every day | ORAL | 0 refills | Status: DC
Start: 2022-10-10 — End: 2022-10-09

## 2022-10-09 MED ORDER — FLUCONAZOLE 100 MG PO TABS
100.0000 mg | ORAL_TABLET | Freq: Once | ORAL | Status: AC
Start: 2022-10-09 — End: 2022-10-09
  Administered 2022-10-09: 100 mg via ORAL
  Filled 2022-10-09: qty 1

## 2022-10-09 MED ORDER — RISAQUAD PO CAPS
1.0000 | ORAL_CAPSULE | Freq: Every day | ORAL | 0 refills | Status: AC
Start: 2022-10-10 — End: ?

## 2022-10-09 NOTE — PT Progress Note (Signed)
Physical Therapy Note    Physical Therapy Treatment  Patient: Judy Freeman    S180/S180.A  Discharge Recommendations:   Based on today's session: Home with supervision;Home with home health PT     If Home with supervision;Home with home health PT  If pt returns home, DME Recommended for Discharge: Front wheel walker      Unit: [facility] MEDICAL SURGICAL     PT Received On: 10/09/22  Start Time: 1530  Stop Time: 1539  Time Calculation (min): 9 min  PT Visit Number: 2    Patient's medical condition is appropriate for Physical Therapy intervention at this time.    Precautions and Contraindications:   Precautions  Weight Bearing Status: no restrictions  Fall Risks: Medium;Impaired balance/gait;Impaired mobility  Other Precautions: h/o MS    Assessment: Patient has met 1/3 STG. Mod I for bed mobility and supervision for transfers/gait.Pt will benefit from Home Health PT to ensure safe transition home and to address the following deficits    Assessment: Gait impairment;Decreased balance;Decreased functional mobility;Decreased endurance/activity tolerance;Decreased LE strength Prognosis: Good;With continued PT status post acute discharge   Progress: Progressing toward goals     Patient Goal: to go home    Goals  Goal Formulation: With patient  Time for Goal Acheivement: By time of discharge  Goals: Select goal  Pt Will Perform Sit to Stand: independent;by time of discharge  Pt Will Transfer Bed/Chair: with rolling walker;with supervision;by time of discharge  Pt Will Ambulate: 31-50 feet;with rolling walker;with supervision;by time of discharge       Plan:   Continue with Physical Therapy services to address gait. Focus next session on gait.  Treatment/Interventions: Exercise;Gait training;Stair training;Neuromuscular re-education;Functional transfer training;LE strengthening/ROM;Endurance training;Cognitive reorientation;Patient/family training;Bed mobility   PT Frequency: 3-4x/wk       Subjective:  Patient is agreeable to participation in the therapy session. Nursing clears patient for therapy.   Pain Assessment  Pain Assessment: No/denies pain        Objective:  Observation of Patient/Vital Signs:  Patient is in bed with peripheral IV in place.    Cognition/Neuro Status  Arousal/Alertness: Appropriate responses to stimuli  Attention Span: Appears intact  Orientation Level: Oriented X4  Memory: Appears intact  Following Commands: Follows all commands and directions without difficulty  Safety Awareness: minimal verbal instruction  Insights: Educated in safety awareness  Problem Solving: Able to problem solve independently  Behavior: attentive;calm;cooperative  Motor Planning: bradykinesia  Coordination: intact  Hand Dominance: right handed    Functional Mobility  Rolling: Independent  Supine to Sit: Modified Independent;HOB raised;to Right  Scooting to EOB: Independent  Sit to Stand: Supervision;  Stand to Sit: Supervision;     Locomotion  Ambulation: Supervision;with front-wheeled walker  Pattern: decreased cadence;decreased step length;Step through;L foot decreased clearance;R foot decreased clearance;shuffle  Distance Walked (ft) (Step 6,7): 380 Feet  Patient appears safe to ambulate in hallway w/ staff for safety due to increase fatigue.                     Neuro Re-Ed  Sitting Balance: independent;without support;without instruction (@ EOB)  Standing Balance: dynamic gait training;with instruction;with support;supervision     AM-PACT "6 Clicks" Basic Mobility Inpatient Short Form  Turning Over in Bed: None  Sitting Down On/Standing From Armchair: None  Lying on Back to Sitting on Side of Bed: None  Assist Moving to/from Bed to Chair: None  Assist to Walk in Hospital Room: A little  Assist to Climb 3-5 Steps with Railing: A little  PT Basic Mobility Raw Score: 22  CMS 0-100% Score: 20.91%    Treatment Activities:     Educated the patient to role of physical therapy, plan of care, goals of therapy and HEP,  safety with mobility and ADLs.    Patient left without needs and call bell within reach. bed Alarm set.  RN notified of session outcome.        Therapist PPE during session gloves     Trude Mcburney  Physical Therapy and Medicine  [phone]

## 2022-10-09 NOTE — Discharge Summary (Signed)
[facility] [place] HOSPITALIST Discharge Summary  Patient Info:   Date/Time: 10/09/2022 / 2:54 PM   Admit Date:10/01/2022  Patient Name:Judy Freeman   ZOX:[id number]   PCP: Hurley Cisco, MD  Attending Physician:Hansford Hirt, Drucilla Schmidt, MD    Hospital Course:   Please see H&P for complete details of HPI and ROS. The patient was admitted   to [facility] and has been taken care as mentioned below.    Judy Freeman is a 47 y.o. female came with the chief complaint of Abdominal Pain and Emesis.  Patient with known history of chronic diarrhea nausea and abdominal pain. Status post flexible sigmoidoscopy no acute emergency. Stool studies are negative. Discontinue antibiotic. Patient started on opium tincture responding somewhat to her bowel movement frequency is decreasing.  Her symptoms are not clearly symptoms of diarrhea it is more of tenesmus feeling of having bowel movement and then she will spend hours in the toilet sometimes 2 to 3 hours sitting there and small amount of semisolid stool becoming at the end of 3-hour it is less than 1/2 cup of stool with semisolid consistency.  That is what she been doing at home as well.  Not clearly fit in any gastrointestinal disease process at this point.  She been having the symptoms from last 3 years she been to different physician she has changed gastroenterologist multiple times now she is hoping to have some diagnosis at this point.  Biopsies are done she will follow-up outpatient with gastroenterologist for further recommendation and treatment.  For now I will provide her opium tincture as it is helping some extent with her symptoms.  Electrolyte been stable.  She does have chronic pain syndrome for which she follows with pain specialist her pain is from her MS history.  She takes Percocet and Lyrica.  Inpatient treatment management outpatient follow-up discussed with the patient and her significant other present at bedside in detail they would like understanding.  Will  discharge in stable mental and hemodynamic status.    Gastroenterologist appointment made for her next week by case management time and date of appointment provided to patient and significant other upon discharge with discharge instructions.     1. Acute diffused Colitis present on CT scan ruled out on flexible sigmoidoscopy.  Most probably she is having colon thickening from chronic diarrhea no acute colitis no need for antibiotic.  Clinically there is more a feeling of having bowel movement but she will spend hours in the toilet without much of the bowel movement.  She did not have any watery bowel movements in the hospital it was semisolid but very small amount her main concern was why she is spending so much time in the toilet, which I could not explain very well as there is no reason for her to spend so much time in the time if there is no bowel movement.  But still biopsies that are done with flexible sigmoidoscopy final diagnosis may be more clear after the result of biopsy available which will be discussed with the patient outpatient when she follows with gastroenterologist.     2. Possible viral URI treated resolved.  Continue supportive care     3. Recurrent intractable nausea, vomiting and diarrhea  Discussed above improving not resolved but she is able to maintain her electrolytes with good oral hydration     4. Migraine headache  Stable continue supportive care continue as needed     5. Generalized weakness medication.  History of MS continue PT OT.  Follow-up with her pain specialist.     6. Multiple sclerosis on monthly Kesimpt     7. Anemia: acute on chronic  - hemoglobin: 8.3 (10/07/22) down from 9.8 (10/02/22)  - mean corpuscular volume: 83 (10/07/22)  - est. baseline hemoglobin: 9.7 g/dL     8. Hypothyroidism  -Patient voiced that she does not take levothyroxine on admission.  We defer to her PCP.  Further workup is needed for her levothyroxine.     9. Hypocalcemia  - calcium: 8.2 (10/08/22) unchanged  from 8.2 (10/07/22), low 8.0 (10/06/22), corrected calcium: 7.9 mg/dL (10/02/27)  - magnesium: 1.7 (10/08/22) unchanged from 1.7 (10/06/22)  - holding home: cholecalciferol 0.25 mg 20000 u po  - completed: lactated ringers infusion iv  - calcium: 8.8 (10/09/22) up from 8.2 (10/08/22), low 8.0 (10/06/22)     10. Overweight  - BMI: 28.8 (10/02/22)     11. Hypokalemia: mild to moderate, resolved  - creatinine clearance: 108, creatinine: 0.7 (10/09/22)  - magnesium: 1.7 (10/08/22)  - potassium: 4.8 (10/09/22) up from 4.4 (10/08/22), low 3.2 (10/06/22)  - completed: lactated ringers infusion iv  - currently on: potassium chloride 20 mEq po daily      Reason for your Hospital Admission:  Chronic diarrhea      Instructions for after your discharge:  Follow with gastroenterologist in 1 week for biopsy result and further planning.      Patient has BMI=Body mass index is 28.82 kg/m.  Diagnosis: Overweight based on BMI criteria          Disposition:home  Condition at Discharge and Prognosis: guarded.  Admission Date:10/01/2022  Discharge Date: 10/09/22  Type of Admission:Inpatient   Code Status: Full Code  Subjective:     Chief Complaint:  Abdominal Pain and Emesis      Objective:     Vitals:    10/09/22 0158 10/09/22 0609 10/09/22 0808 10/09/22 1147   BP: (!) 132/95 100/64 111/76 108/71   Pulse: 77 79 65 71   Resp: 17 16 18 16    Temp: 97.9 F (36.6 C) 97.7 F (36.5 C) 98.1 F (36.7 C) 98.1 F (36.7 C)   TempSrc: Oral Oral Oral Oral   SpO2: 100% 96% 99% 99%   Weight:       Height:         Physical Exam:       Clinical Presentation:     History of Presenting Illness: Please refer to HPI in the Detailed H&P    Discharge Medications:   Discharge Medications:      Discharge Medication List        Taking      Armodafinil 250 MG tablet  Dose: 250 mg  Commonly known as: NUVIGIL  Take 1 tablet (250 mg) by mouth every morning     dicyclomine 20 MG tablet  Dose: 20 mg  Commonly known as: BENTYL  Take 1 tablet (20 mg total) by mouth every 8 (eight)  hours as needed (abdominal pain)     HYDROcodone-acetaminophen 5-325 MG per tablet  Dose: 1 tablet  Commonly known as: NORCO  Take 1 tablet by mouth every 6 (six) hours as needed     Kesimpta 20 MG/0.4ML Soaj  Generic drug: Ofatumumab  INJECT 1 PEN UNDER THE SKIN EVERY MONTH     lactobacillus/streptococcus Caps  Dose: 1 capsule  Start taking on: October 10, 2022  Take 1 capsule by mouth daily     loperamide 2 MG tablet  Dose:  2 mg  Commonly known as: IMODIUM A-D  Take 1 tablet (2 mg) by mouth as needed for Diarrhea     NURTEC PO  Take by mouth as needed For migraines     ondansetron 4 MG disintegrating tablet  Dose: 4 mg  Commonly known as: ZOFRAN-ODT  Take 1 tablet (4 mg) by mouth every 6 (six) hours as needed for Nausea     opium 10 MG/ML (1%) Tinc  Dose: 0.6 mL  Take 0.6 mLs by mouth every 4 (four) hours as needed (for diarrhea as needed)     oxybutynin 10 MG 24 hr tablet  Dose: 10 mg  Commonly known as: DITROPAN-XL  Take 1 tablet (10 mg) by mouth daily     pregabalin 75 MG capsule  Dose: 150 mg  Commonly known as: LYRICA  Take 2 capsules (150 mg) by mouth nightly Taking 150mg  qhs     Vitamin D3 250 MCG (10000 UT) capsule  Dose: 20,000 Units  Take 20,000 Units by mouth daily            STOP taking these medications      diazePAM 2 MG tablet  Commonly known as: VALIUM     Levothyroxine Sodium 50 MCG Caps     Sunosi 150 MG Tabs  Generic drug: Solriamfetol HCl              Follow up recommendations:   Follow up:    Follow-up Information       [facility] Follow up in 2 day(s).    Specialty: Home Health Services  Contact information:  [address]  [place] [address]  [phone]             Philbert Riser, MD Follow up on 10/17/2022.    Specialty: Gastroenterology  Why: this appointment is at @ 2PM  Contact information:  [address]  [place] [address]  [phone]               Hurley Cisco, MD .    Specialty: Internal Medicine  Contact information:  [address]  [place] [address]  [phone]                              Results of Labs/imaging:   Labs have been reviewed:   Coagulation Profile:       CBC review:   Recent Labs   Lab 10/07/22  0613   WBC 3.76   Hemoglobin 8.3*   Hematocrit 26.1*   Platelet Count 234   MCV 83.4   RDW 14     Chem Review:  Recent Labs   Lab 10/09/22  0618 10/08/22  0651 10/07/22  0613 10/06/22  0630   Sodium 136 138 140 139   Potassium 4.8 4.4 3.3* 3.2*   Chloride 106 110 109 107   CO2 25 22 25 25    BUN 3* 4* <3* 5*   Creatinine 0.7 0.6 0.6 0.6   Glucose 112* 90 81 91   Calcium 8.8 8.2* 8.2* 8.0*   Magnesium  --  1.7  --  1.7     Results       Procedure Component Value Units Date/Time    Basic Metabolic Panel [[id number]]  (Abnormal) Collected: 10/09/22 0618    Specimen: Blood, Venous Updated: 10/09/22 0656     Glucose 112 mg/dL  BUN 3 mg/dL      Creatinine 0.7 mg/dL      Calcium 8.8 mg/dL      Sodium 409 mEq/L      Potassium 4.8 mEq/L      Chloride 106 mEq/L      CO2 25 mEq/L      Anion Gap 5.0     GFR >60.0 mL/min/1.73 m2           Radiology reports have been reviewed:  Radiology Results (24 Hour)       ** No results found for the last 24 hours. **          Chest AP Portable    Result Date: 10/02/2022  HISTORY: Sepsis COMPARISON: 06/23/2022. FINDINGS: LUNGS: No consolidation or edema. PLEURA: No pleural effusions or pneumothorax. HEART AND MEDIASTINUM:  No cardiac enlargement. BONES:  Suboptimally evaluated.     No acute abnormality. Johnsie Kindred, MD 10/02/2022 12:33 AM    CT Abd/Pelvis with IV Contrast    Result Date: 10/02/2022  HISTORY: Abdominal pain, nausea, vomiting, diarrhea. COMPARISON: None available. TECHNIQUE: CT abdomen and pelvis WITH intravenous contrast. The following dose reduction techniques were utilized: Automated exposure control and/or adjustment of the mA and/or kV according to patient size, and the use of iterative reconstruction technique. CONTRAST: iohexol (OMNIPAQUE) 350 MG/ML injection 100 mL. FINDINGS:  LOWER THORAX: Status post cholecystectomy. No biliary dilation. LIVER/BILIARY TREE: No mass or intrahepatic biliary dilatation. No gallbladder distension or calcified gallstones. SPLEEN: No splenomegaly. PANCREAS: No pancreatic mass or duct dilatation. KIDNEYS/URETERS: No hydronephrosis, stones or solid mass lesions. ADRENALS: No adrenal mass. PELVIC ORGANS/BLADDER: No pelvic masses. PERITONEUM/RETROPERITONEUM: No free air or fluid. LYMPH NODES: No lymphadenopathy. VESSELS: No aortic aneurysm. GI TRACT: Moderate apparent wall thickening of the entire colon, favor this is due to underdistention. Acute colitis cannot be excluded. No bowel obstruction. Normal appendix. BONES AND SOFT TISSUES: Unremarkable.     Moderate apparent wall thickening of the entire colon, favor this is due to underdistention. Acute colitis cannot be excluded. Johnsie Kindred, MD 10/02/2022 12:06 AM    Pathology:   Specimens (From admission, onward)       Start     Ordered    10/05/22 1326  Surgical Pathology  RELEASE UPON ORDERING        Question Answer Comment   Specimen Type Tissue    Release to patient Immediate        10/05/22 1326    10/05/22 1324  Surgical Pathology  RELEASE UPON ORDERING,   Status:  Canceled        Question Answer Comment   Specimen Type Tissue    Release to patient Immediate        10/05/22 1324                  Pending Lab Results:   Labs/Images to be followed at your PCP office:   Unresulted Labs       Procedure . . . Date/Time    Stool Pancreatic Elastase [[id number]] Collected: 10/04/22 1748    Specimen: Stool Updated: 10/04/22 1755    Inflammatory Bowel Disease Panel [[id number]] Collected: 10/04/22 1600    Specimen: Blood, Venous Updated: 10/04/22 1611          Hospitalist:     Signed by: Zada Finders, MD  10/09/2022 2:54 PM  Time spent for discharge including face-to-face and non-face-to-face: 35 minutes      *This note was generated by the Epic EMR system/ Dragon  speech recognition and may contain inherent errors or  omissions not intended by the user. Grammatical errors, random word insertions, deletions, pronoun errors and incomplete sentences are occasional consequences of this technology due to software limitations. Not all errors are caught or corrected. If there are questions or concerns about the content of this note or information contained within the body of this dictation they should be addressed directly with the author for clarification

## 2022-10-09 NOTE — Nursing Progress Note (Addendum)
Reviewed discharge instructions with patient and patient's family at bedside. PIV removed. Pt belongings collected and accounted for. Family will transport patient home. Patient has no questions or concerns at this time.     [facility] - contacted MD, meds are sent to [facility] outpatient pharmacy. Called [facility] outpatient pharmacy - opium tincture not available until tomorrow morning; new pharmacy "[facility], [place], [place]" does have the med available - alpha phone contacted to transfer script.

## 2022-10-09 NOTE — Plan of Care (Signed)
 Problem: Pain interferes with ability to perform ADL  Goal: Pain at adequate level as identified by patient  Outcome: Progressing     Problem: Side Effects from Pain Analgesia  Goal: Patient will experience minimal side effects of analgesic therapy  Outcome: Progressing     Problem: Moderate/High Fall Risk Score >5  Goal: Patient will remain free of falls  Outcome: Progressing     Problem: Safety  Goal: Patient will be free from injury during hospitalization  Outcome: Progressing     Problem: Fluid and Electrolyte Imbalance/ Endocrine  Goal: Fluid and electrolyte balance are achieved/maintained  Outcome: Progressing     Problem: Compromised Sensory Perception  Goal: Sensory Perception Interventions  Outcome: Progressing     Problem: Compromised Activity/Mobility  Goal: Activity/Mobility Interventions  Outcome: Progressing     Problem: Compromised Nutrition  Goal: Nutrition Interventions  Outcome: Progressing     Problem: Altered GI Function  Goal: Fluid and electrolyte balance are achieved/maintained  Outcome: Progressing  Goal: Elimination patterns are normal or improving  Outcome: Progressing

## 2022-10-09 NOTE — UM Notes (Signed)
CSR, 7/14:    PATIENT NAME: Judy Freeman,Judy Freeman   DOB: Feb 28, 1976     Reason for Continued Stay:  47 Year old female admitted with acute diffused colitis    ROS:  Constitutional:  Positive for malaise/fatigue.   Gastrointestinal:  Positive for abdominal pain and nausea.     Vital Signs:      10/08/22 0019 10/08/22 0536 10/08/22 0750 10/08/22 1142   BP: 125/87 108/62 111/75 133/85   Pulse: 69 72 62 66   Resp: 16 16       Temp: 98.4 F (36.9 C) 98.2 F (36.8 C) 98.6 F (37 C) 98.6 F (37 C)   TempSrc: Oral Oral Oral Oral   SpO2: 99% 97% 99% 99%   Weight:               Labs:   Latest Reference Range & Units 10/08/22 06:51   Glucose 70 - 100 mg/dL 90   BUN 7 - 21 mg/dL 4 (L)   Creatinine 0.4 - 1.0 mg/dL 0.6   Sodium 161 - 096 mEq/L 138   Potassium 3.5 - 5.3 mEq/L 4.4   Chloride 99 - 111 mEq/L 110   CO2 17 - 29 mEq/L 22   Calcium 8.5 - 10.5 mg/dL 8.2 (L)   Anion Gap 5.0 - 15.0  6.0   EGFR >=60.0 mL/min/1.73 m2 >60.0   Magnesium 1.6 - 2.6 mg/dL 1.7   (L): Data is abnormally low    Plan of Care:  1. Acute diffused Colitis ruled out on endoscopy  Patient still have diarrhea suspected functional with increase transient time and diarrhea. GI ordered opium tincture , I added imodium as well. I will d/c antibiotics. Follow on chem 8 and cbc.     2. Possible viral URI  Continue supportive care     3. Recurrent intractable nausea, vomiting and diarrhea  secondary to colitis. Underlying cause appreciate gastroenterologist input we will follow-up on recommendation. Patient is n.p.o. now. Better recommendation per gastroenterologist after the sigmoidoscopy.     4. Migraine headache  Stable continue supportive care continue as needed     5. Generalized weakness medication.  History of MS will consult PT OT follow-up on recommendation.     6. Multiple sclerosis on monthly Kesimpt     7. Anemia: acute on chronic, downtrending, normocytic  - hemoglobin: 8.3 (10/07/22) down from 9.8 (10/02/22)  - mean corpuscular volume: 83 (10/07/22)  -  est. baseline hemoglobin: 9.7 g/dL     8. Hypothyroidism  - holding home: levothyroxine 50 mcg po     9. Hypocalcemia  - calcium: 8.2 (10/08/22) unchanged from 8.2 (10/07/22), low 8.0 (10/06/22), corrected calcium: 7.9 mg/dL (0/4/54)  - magnesium: 1.7 (10/08/22) unchanged from 1.7 (10/06/22)  - holding home: cholecalciferol 0.25 mg 20000 u po  - completed: lactated ringers infusion iv     10. Overweight  - BMI: 28.8 (10/02/22)     11. Hypokalemia: mild to moderate, resolved  - creatinine clearance: 112, creatinine: 0.6 (10/08/22)  - magnesium: 1.7 (10/08/22)  - potassium: 4.4 (10/08/22) up from 3.3 (10/07/22), low 3.2 (10/06/22)  - completed: lactated ringers infusion iv            DVT Prophylaxis:Medication VTE Prophylaxis Orders: enoxaparin (LOVENOX) syringe 40 mg  Mechanical VTE Prophylaxis Orders: Maintain sequential compression device   Central Line/Foley Catheter/PICC line status: None  Code Status: Full Code  Disposition:Planning to discharge patient to home  Type of Admission:Inpatient    Medications:  Scheduled Meds:  Current Facility-Administered Medications   Medication Dose Route Frequency    enoxaparin  40 mg Subcutaneous Daily    fluticasone  1 spray Each Nare Daily    lactobacillus/streptococcus  1 capsule Oral Daily    potassium chloride  20 mEq Oral Daily     Continuous Infusions:  PRN Meds:.acetaminophen, benzocaine-menthol, benzonatate, carboxymethylcellulose sodium, dextrose **OR** dextrose **OR** dextrose **OR** glucagon (rDNA), HYDROcodone-acetaminophen, loperamide, magnesium sulfate, melatonin, morphine, naloxone, ondansetron, opium, potassium & sodium phosphates, potassium chloride **AND** potassium chloride, promethazine (PHENERGAN) IVPB, saline, zolpidem      PRN:  Zofran 4mg  IV X 2 doses  Opium 0.32ml PO X 2 doses      UTILIZATION REVIEW CONTACT: Gae Gallop RN, BSN  Utilization Review   [facility]  [address]  [address]  [place], [place] [address]  Phone: [phone] (Voice Mail Only)  Email: [email or web address]   NPI:   [id number]  Tax ID:  [id number]         NOTES TO REVIEWER:    This clinical review is based on/compiled from documentation provided by the treatment team within the patient's medical record.

## 2022-10-09 NOTE — Plan of Care (Signed)
Problem: Moderate/High Fall Risk Score >5  Goal: Patient will remain free of falls  Outcome: Progressing  Flowsheets (Taken 10/09/2022 0754)  Moderate Risk (6-13):   LOW-Fall Interventions Appropriate for Low Fall Risk   LOW-Anticoagulation education for injury risk     Problem: Safety  Goal: Patient will be free from injury during hospitalization  Outcome: Progressing  Flowsheets (Taken 10/09/2022 0754)  Patient will be free from injury during hospitalization:   Assess patient's risk for falls and implement fall prevention plan of care per policy   Use appropriate transfer methods   Hourly rounding   Provide and maintain safe environment     Problem: Fluid and Electrolyte Imbalance/ Endocrine  Goal: Fluid and electrolyte balance are achieved/maintained  Outcome: Progressing  Flowsheets (Taken 10/09/2022 0754)  Fluid and electrolyte balance are achieved/maintained:   Monitor/assess lab values and report abnormal values   Monitor for muscle weakness   Assess and reassess fluid and electrolyte status   Observe for cardiac arrhythmias     Problem: Altered GI Function  Goal: Fluid and electrolyte balance are achieved/maintained  Outcome: Progressing  Flowsheets (Taken 10/09/2022 0754)  Fluid and electrolyte balance are achieved/maintained:   Monitor/assess lab values and report abnormal values   Monitor for muscle weakness   Assess and reassess fluid and electrolyte status   Observe for cardiac arrhythmias

## 2022-10-09 NOTE — Progress Notes (Signed)
Cm rounded with Dr. Alinda Sierras and patient will need follow up with Dr. Delane Ginger as soon as possible. Cm spoke with Dr. Shiela Mayer office and obtained an appointment for 10/17/22 @ 1400. Follow up information placed on AVS. RN Jujee informed and she will provide to patient. No other CM needs identified at this time. CM will follow as needed.

## 2022-10-09 NOTE — Progress Notes (Addendum)
Pt left the facility at around 2015. All discharge instruction, including meds, and pt teaching given by previous nurse. IV d/ced by previous shift nurse. Family member was with her during discharge.

## 2022-10-09 NOTE — OT Progress Note (Signed)
Occupational Therapy Treatment  Patient: Judy Freeman    S180/S180.A  Discharge Recommendations:   Based on today's session: Home with supervision;Home with home health OT     If pt returns home, DME Recommended for Discharge: Front wheel walker;Reacher;Tub transfer bench;Other (Comment) (raisted toilet seat with arm rests)    Unit: [facility] MEDICAL SURGICAL     MRN#:  [id number]    OT Received On: 10/09/22  Start Time: 1617  Stop Time: 1655  Time Calculation (min): 38 min    OT Visit Number: 2    Precautions and Contraindications:    Precautions  Weight Bearing Status: no restrictions  Fall Risks: Medium;Impaired balance/gait;Impaired mobility  Other Precautions: h/o MS    Patient Goal  Patient Goal: "To be independent".    Assessment: Pt met 4/5 goals and demonstrates good participation in OT tx session. Pt appears to be nearing/at baseline level w/o further acute care skilled OT interventions. Pt will continue to benefit from supervision and [facility] OT to address higher level ADLs/IADLs in home environment.        Assessment  Assessment: Appears to be at baseline for ADL's  Prognosis: Good;With continued OT s/p acute discharge  Progress: Discontinue OT    Goal Formulation: Patient  Time For Goal Achievement: 5 visits  ADL Goals  Patient will dress lower body: Independent;2 visits;Goal met  Patient will toilet: Modified Independent;2 visits;Goal met (simulation)  Mobility and Transfer Goals  Pt will perform functional transfers: with rolling walker;3 visits;Modified Independent;Goal met  Neuro Re-Ed Goals  Pt will perform dynamic standing balance: for 10 minutes;to complete standing ADLs safely;3 visits;Modified Independent  Musculoskeletal Goals  Pt will demonstrate increase of strength: to 4/5;to increase ability to complete ADLs;5 visits;Goal met       Plan: N/A d/c acute OT services    Goal Formulation: Patient  Treatment Interventions: No skilled interventions needed at this  time  Risks/Benefits/POC Discussed with Pt/Family: With patient  OT Frequency Recommended: therapy discontinued      Subjective:   .    Patient's medical condition is appropriate for Occupational Therapy intervention at this time.  Patient is agreeable to participation in the therapy session. Nursing clears patient for therapy.  Pain Assessment  Pain Assessment: No/denies pain    Objective:  Observation of Patient/Vital Signs:  Patient is in bed with peripheral IV in place.    Cognition/Neuro Status  Arousal/Alertness: Appropriate responses to stimuli  Attention Span: Appears intact  Orientation Level: Oriented X4  Memory: Appears intact  Following Commands: Follows all commands and directions without difficulty  Safety Awareness: minimal verbal instruction  Insights: Educated in safety awareness  Problem Solving: Able to problem solve independently  Behavior: attentive;calm;cooperative  Motor Planning: bradykinesia  Coordination: intact  Hand Dominance: right handed    Functional Mobility  Rolling: Supervision  Scooting Transfers: Supervision  Supine to Sit Transfers: Supervision;additional time  Sit to Stand Transfers: Supervision;additional time  Stand to Sit Transfers: Supervision;additional time  Bed to Chair Transfers: Supervision  Chair Transfers: Supervision  Functional Mobility/Ambulation: Supervision    Self-care and Home Management  Eating: Independent;in a chair  LB Dressing: Supervision;edge of bed  Toileting: Supervision (simulation)  Functional Transfers: Supervision         AM-PACT "6 Clicks" Daily Activity Inpatient Short Form  Inpatient AM-PACT Performed?: yes  Put On/Take Off Lower Body Clothing: A little  Assist with Bathing: A little  Assist with Toileting: None  Put On/Take Off Upper  Body Clothing: None  Assist with Grooming: None  Assist with Eating: None  OT Daily Activity Raw Score: 22  CMS 0-100% Score: 25.80%    Treatment Activities:     Pt was seen this pm for OT tx session and engaged in  the following above listed therapeutic activities. Educated the pt to role of OT, POC, goals of therapy and HEP, safety with mobility and ADLs, energy conservation techniques, pursed lip breathing, and home safety.    Pt left seated in chair w/ call bell and phone within reach. Verbally instructed pt in calling for nsg w/ any OOB needs. Pt verbalized understanding for all education provided. Nursing notified of session outcomes. Therapist PPE during session gloves.      Gaynelle Cage, OTR/L  [facility]  Physical Medicine and Rehab Department

## 2022-10-09 NOTE — Nursing Progress Note (Addendum)
NURSE NOTE SUMMARY  [facility] - [facility] MEDICAL SURGICAL   Patient Name: GLEE, LASHOMB   Attending Physician: Zada Finders, MD   Today's date:   10/09/2022 LOS: 4 days   Shift Summary:                                                              -Patient with no c/o nausea  -Patient with 2x occasions of multiple BM's with long periods of having to stay on toilet. C/O irritation at rectum and vagina. Zinc oxide paste given to patient to help with this discomfort, barrier wipes also given to patient.   -Opium PRN dose given to patient.  -Patient was able to sleep after opium dose, however, feeling fatigued this AM.      Provider Notifications:   Pt c/o ongoing vaginal discomfort, itching, irritation, and thick but clear discharge. Had one time dose of diflucan on 07/13 at 2330. Pt concerned this one time dose was not sufficient.   -MD [facility] ordered an additional one time dose of diflucan and recommends additional assessment from MD Afzal in day time for any additional treatment.      Rapid Response Notifications:  Mobility:      PMP Activity: Step 6 - Walks in Room (10/08/2022 10:00 PM)     Weight tracking:  Family Dynamic:   No data found.          Last Bowel Movement   Last BM Date: 10/08/22

## 2022-10-09 NOTE — Consults (Incomplete)
Nutritional Support Services  Nutrition Assessment    Judy Freeman 47 y.o. female   MRN: [id number]    Summary of Nutrition Plan of Care & Recommendations:  1)   2)    -----------------------------------------------------------------------------------------------------------------                                                      Assessment Data:   Referral Source: Consult   Reason for Referral: Fiber Diet, Nutrition Supplements    Subjective Nutrition: met with patient in room - family at bedside. Patient reports change in bowel habits x 3 years, frequent stools major symptom. She has been gluten free and primarily plant based during this time.    Hospital Admission: Patient with history of MS (on Kesiimpta) admitted 7/7 with colitis    Medical Hx:  has a past medical history of Arthritis, Asthma, Breast lump (02/22/2017), Bulging lumbar disc, Calcific tendinitis, Disorder of thyroid, MS (multiple sclerosis), Pneumonia (2011, 2012), and Sepsis (2011).  PSH: has a past surgical history that includes Cesarean section; Cholecystectomy; DEBRIDEMENT & IRRIGATION, ABSCESS (2015); and COLONOSCOPY, WITH BIOPSY (N/A, 10/05/2022).     Diet Hx:   Breakfast/Lunch - [employer] Breakfast Sausage, Cauliflower Hash, JUST Egg, in breakfast wrap (GF)  Lunch/Dinner - [employer] GF meals including Panang Curry with Garlic Cabbage  Snacks - Humus, Potatoes Chips    Social Hx: patient comes from home/independent     Orders Placed This Encounter   Procedures    Adult diet Therapeutic/ Modified; Solid; Regular (IDDSI level 7); Thin (IDDSI level 0); Gluten free    [employer] 1.0 Supplement Quantity: A. One; Flavor: Vanilla; Frequency: BID (2 times a day) - with Lunch and Dinner   Intake: 25-50% x last 3 meals documented, previously NPO     ANTHROPOMETRIC  Anthropometrics  Height: 167.6 cm (5\' 6" )  Weight: 81 kg (178 lb 9.2 oz)  Weight Change: -4.26  BMI (calculated): 28.8     Weight Monitoring      Weight Weight Method BMI  (calculated)   05/17/2021 82.373 kg   29.4 kg/m2    10/01/2022 84.6 kg  Bed Scale  30.1 kg/m2    10/02/2022 81 kg  Bed Scale  28.8 kg/m2    Limited weight history per chart review     Nutrition Focused Physical Exam (NFPE): 10/09/2022  Head: no overt s/s subcutaneous muscle or fat loss  Upper Body: no overt s/s subcutaneous muscle or fat loss  Lower Body: no s/s subcutaneous fat or muscle loss  Edema: WDL  Skin: intact   GI function: LBM 7/14    ESTIMATED NEEDS                 Pertinent Medications: reviewed   Probiotic,     IVF:      Allergies   Allergen Reactions    Gluten Meal     Penicillins Hives     Pertinent labs:  Recent Labs   Lab 10/09/22  0618 10/08/22  0651 10/07/22  0613 10/06/22  0630   Sodium 136 138 140 139   Potassium 4.8 4.4 3.3* 3.2*   Chloride 106 110 109 107   CO2 25 22 25 25    BUN 3* 4* <3* 5*   Creatinine 0.7 0.6 0.6 0.6   Glucose 112* 90 81 91  Calcium 8.8 8.2* 8.2* 8.0*   Magnesium  --  1.7  --  1.7   GFR >60.0 >60.0 >60.0 >60.0   WBC  --   --  3.76  --    Hematocrit  --   --  26.1*  --    Hemoglobin  --   --  8.3*  --      Learning Needs: Iron Contents of Foods (2022)    Nutrition Assessment Summary:                                                                 Nutrition Diagnosis      {IHS DIAGNOSIS INTAKE:30423388} related to *** as evidenced by ***      {IHS DIAGNOSIS CLINICAL:30423389} related to *** as evidenced by ***     {IHS DIAGNOSIS BEHAVIORAL ENVIRONMENTAL:30423390} related to *** as evidenced by ***                                                              Intervention     Summary of Nutrition Plan of Care & Recommendations:         Goals:                                                          Monitoring                                                              Evaluation     Initial nutrition goal established.    Susette Racer, MS, RDN  Office: [phone]  Spectralink: [phone]

## 2022-10-09 NOTE — UM Notes (Addendum)
CSR, 7/13:    PATIENT NAME: Judy Freeman, Judy Freeman   DOB: September 11, 1975     Reason for Continued Stay:  47 Year old female admitted with acute diffused colitis    ROS:  Constitutional:  Positive for malaise/fatigue.   Gastrointestinal:  Positive for abdominal pain and nausea.     Vital Signs:      10/07/22 0027 10/07/22 0348 10/07/22 0807 10/07/22 1224   BP: 118/77 133/86 122/72 125/74   Pulse: 70 67 69 78   Resp: 17 17 15 17    Temp: 98.2 F (36.8 C) 97.5 F (36.4 C) 97.9 F (36.6 C) 98.6 F (37 C)   TempSrc: Oral Oral Oral Oral   SpO2: 99% 98% 98% 100%   Weight:               Labs:   Latest Reference Range & Units 10/07/22 06:13   WBC 3.10 - 9.50 x10 3/uL 3.76   Hemoglobin 11.4 - 14.8 g/dL 8.3 (L)   Hematocrit 16.1 - 43.7 % 26.1 (L)   Platelet Count 142 - 346 x10 3/uL 234   RBC 3.90 - 5.10 x10 6/uL 3.13 (L)   MCV 78.0 - 96.0 fL 83.4   MCH 25.1 - 33.5 pg 26.5   MCHC 31.5 - 35.8 g/dL 09.6   RDW 11 - 15 % 14   MPV 8.9 - 12.5 fL 11.5   Nucleated RBC <=0.0 /100 WBC 0.0   Nucleated RBC Absolute <=0.00 x10 3/uL 0.00   Glucose 70 - 100 mg/dL 81   BUN 7 - 21 mg/dL <3 (L)   Creatinine 0.4 - 1.0 mg/dL 0.6   Sodium 045 - 409 mEq/L 140   Potassium 3.5 - 5.3 mEq/L 3.3 (L)   Chloride 99 - 111 mEq/L 109   CO2 17 - 29 mEq/L 25   Calcium 8.5 - 10.5 mg/dL 8.2 (L)   Anion Gap 5.0 - 15.0  6.0   EGFR >=60.0 mL/min/1.73 m2 >60.0   (L): Data is abnormally low    Plan of Care:    1. Acute diffused Colitis ruled out on endoscopy  Patient still have diarrhea suspected functional with increase transient time and diarrhea. GI ordered opium tincture , I added imodium as well. I will d/c antibiotics. Follow on chem 8 and cbc.     2. Possible viral URI  Continue supportive care     3. Recurrent intractable nausea, vomiting and diarrhea  secondary to colitis. Underlying cause appreciate gastroenterologist input we will follow-up on recommendation. Patient is n.p.o. now. Better recommendation per gastroenterologist after the sigmoidoscopy.     4.  Migraine headache  Stable continue supportive care continue as needed     5. Generalized weakness medication.  History of MS will consult PT OT follow-up on recommendation.     6. Multiple sclerosis on monthly Kesimpt     7. Anemia: acute on chronic, downtrending, normocytic  - hemoglobin: 8.3 (10/07/22) down from 9.8 (10/02/22)  - mean corpuscular volume: 83 (10/07/22)  - est. baseline hemoglobin: 9.7 g/dL     8. Hypothyroidism  - holding home: levothyroxine 50 mcg po     9. Hypocalcemia  - calcium: 8.2 (10/08/22) unchanged from 8.2 (10/07/22), low 8.0 (10/06/22), corrected calcium: 7.9 mg/dL (10/25/17)  - magnesium: 1.7 (10/08/22) unchanged from 1.7 (10/06/22)  - holding home: cholecalciferol 0.25 mg 20000 u po  - completed: lactated ringers infusion iv     10. Overweight  - BMI: 28.8 (10/02/22)  11. Hypokalemia: mild to moderate, resolved  - creatinine clearance: 112, creatinine: 0.6 (10/08/22)  - magnesium: 1.7 (10/08/22)  - potassium: 4.4 (10/08/22) up from 3.3 (10/07/22), low 3.2 (10/06/22)  - completed: lactated ringers infusion iv            DVT Prophylaxis:Medication VTE Prophylaxis Orders: enoxaparin (LOVENOX) syringe 40 mg  Mechanical VTE Prophylaxis Orders: Maintain sequential compression device   Central Line/Foley Catheter/PICC line status: None  Code Status: Full Code  Disposition:Planning to discharge patient to home  Type of Admission:Inpatient        Medications:     Scheduled Meds: PRN Meds:    enoxaparin, 40 mg, Subcutaneous, Daily  fluticasone, 1 spray, Each Nare, Daily  lactobacillus/streptococcus, 1 capsule, Oral, Daily  potassium chloride, 20 mEq, Oral, Daily           Continuous Infusions:    acetaminophen, 650 mg, Q6H PRN  benzocaine-menthol, 1 lozenge, Q2H PRN  benzonatate, 100 mg, TID PRN  carboxymethylcellulose sodium, 1 drop, TID PRN  dextrose, 15 g of glucose, PRN   Or  dextrose, 12.5 g, PRN   Or  dextrose, 12.5 g, PRN   Or  glucagon (rDNA), 1 mg, PRN  HYDROcodone-acetaminophen, 1 tablet, Q6H  PRN  loperamide, 4 mg, 4X Daily PRN  magnesium sulfate, 1 g, PRN  melatonin, 3 mg, QHS PRN  morphine, 2 mg, Q4H PRN  naloxone, 0.2 mg, PRN  ondansetron, 4 mg, Q6H PRN  opium, 0.6 mL, Q4H PRN  potassium & sodium phosphates, 2 packet, PRN  potassium chloride, 0-40 mEq, PRN   And  potassium chloride, 10 mEq, PRN  promethazine (PHENERGAN) IVPB, 12.5 mg, Q6H PRN  saline, 2 spray, Q4H PRN  zolpidem, 5 mg, QHS PRN             PRN:  Promethazine 12.5mg  IV X 1 dose  Zofran 4mg  IV X 2 doses  Opium 0.58ml PO X 4 doses      UTILIZATION REVIEW CONTACT: Gae Gallop RN, BSN  Utilization Review   [facility]  [address]  [address]  [place], [place] [address]  Phone: [phone] (Voice Mail Only)  Email: [email or web address]   NPI:   [id number]  Tax ID:  [id number]         NOTES TO REVIEWER:    This clinical review is based on/compiled from documentation provided by the treatment team within the patient's medical record.

## 2022-10-10 LAB — SURGICAL PATHOLOGY

## 2022-10-10 LAB — INFLAMMATORY BOWEL DISEASE PANEL
ANCA Screen: NEGATIVE
Myeloperoxidase Antibody: <1 AI (ref <1.0)
Proteinase 3 Antibody: <1 AI (ref <1.0)
S. cerevisiae Antibody (ASCA), IgA: 9 U (ref <=20.0)
S. cerevisiae Antibody (ASCA), IgG: 10.3 U (ref <=20.0)

## 2022-10-10 NOTE — Interim Summary (Signed)
Was notified by primary RN that script for tincture of opium unable to be filled at initial pharmacy and requesting script to be transferred to new pharmacy in [place]. Prescription was transferred to new pharmacy to prevent any delay in medical care. Initial script was canceled. JINitial script was written by Dr. Alinda Sierras, as I work in same group script was transferred. Justification for dose, usage and quantity to be done by primary attending, Dr. Alinda Sierras, as I am the covering provider at this time wishing to prevent harm from inability to receive appropriate medication. Based on documentation it appears patient has copious diarrhea causing dehydration, multiple other medications tried and still with ongoing diarrhea despite mutiple anti-diarrheals. Tincture of opium appears to be helping and was given 8 day script based on current usage. Will need close follow up with GI specialist and PCP for further scripts in future.

## 2022-10-12 LAB — STOOL PANCREATIC ELASTASE: Stool Pancreatic Elastase-1: >500 mcg/g

## 2022-12-17 NOTE — Anesthesia Postprocedure Evaluation (Signed)
Anesthesia Post Evaluation    Patient: Judy Freeman    Procedure(s):  COLONOSCOPY, WITH BIOPSY    Anesthesia type: general    Last Vitals:   Vitals Value Taken Time   BP 124/87 10/05/22 1340   Temp 36.9 C (98.4 F) 10/05/22 1337   Pulse 64 10/05/22 1350   Resp 11 10/05/22 1350   SpO2 99 % 10/05/22 1350                 Anesthesia Post Evaluation:     Patient Evaluated: bedside  Patient Participation: waiting for patient participation  Level of Consciousness: lethargic  Pain Score: 0  Pain Management: adequate  Non-opioid multimodal analgesia not used between 6 hours prior to anesthesia start and PACU discharge    Airway Patency: patent  Two or more mitigation strategies used for obstructive sleep apnea.  Strategies: verification of full NMB reversal and awake extubation    Anesthetic complications: No      PONV Status: none    Cardiovascular status: acceptable  Respiratory status: acceptable  Hydration status: acceptable    Quality/Regulatory Reporting Exceptions  Medical reason(s) for not using multimodal pain management: no pain reported

## 2022-12-17 NOTE — Transfer of Care (Signed)
Care transferred to, and accepted by receiving RN in stable condition with all questions answered.

## 2023-02-19 ENCOUNTER — Telehealth: Payer: Self-pay

## 2023-02-21 NOTE — Telephone Encounter (Signed)
Phone call placed to facilitate scheduling  Voicemail message left with call back information as well as contact numbers for scheduling

## 2023-02-26 ENCOUNTER — Other Ambulatory Visit: Payer: Self-pay | Admitting: Neurology

## 2023-02-26 DIAGNOSIS — R202 Paresthesia of skin: Secondary | ICD-10-CM

## 2023-02-26 DIAGNOSIS — E559 Vitamin D deficiency, unspecified: Secondary | ICD-10-CM

## 2023-02-26 DIAGNOSIS — D84821 Immunodeficiency due to drugs: Secondary | ICD-10-CM

## 2023-02-26 DIAGNOSIS — G35 Multiple sclerosis: Secondary | ICD-10-CM

## 2023-03-12 ENCOUNTER — Other Ambulatory Visit: Payer: Self-pay | Admitting: Neurology

## 2023-03-12 DIAGNOSIS — E559 Vitamin D deficiency, unspecified: Secondary | ICD-10-CM

## 2023-03-12 DIAGNOSIS — Z79899 Other long term (current) drug therapy: Secondary | ICD-10-CM

## 2023-03-12 DIAGNOSIS — G35 Multiple sclerosis: Secondary | ICD-10-CM

## 2023-03-12 DIAGNOSIS — R202 Paresthesia of skin: Secondary | ICD-10-CM

## 2023-03-17 ENCOUNTER — Other Ambulatory Visit (INDEPENDENT_AMBULATORY_CARE_PROVIDER_SITE_OTHER): Payer: Self-pay | Admitting: Neurology

## 2023-04-18 ENCOUNTER — Telehealth: Payer: Self-pay

## 2023-04-18 NOTE — Telephone Encounter (Signed)
 Call received from [facility]    Requesting Kesimpta PA. Advised that the patient has not been seen in Clinic since 05/2021    Current RX cancelled
